# Patient Record
Sex: Female | Born: 1960
Health system: Southern US, Community
[De-identification: ages and names within clinical notes are randomized; demographics above are authoritative.]

## PROBLEM LIST (undated history)

## (undated) DIAGNOSIS — M545 Low back pain, unspecified: Secondary | ICD-10-CM

## (undated) DIAGNOSIS — M722 Plantar fascial fibromatosis: Secondary | ICD-10-CM

## (undated) DIAGNOSIS — M5136 Other intervertebral disc degeneration, lumbar region: Secondary | ICD-10-CM

## (undated) DIAGNOSIS — M51369 Other intervertebral disc degeneration, lumbar region without mention of lumbar back pain or lower extremity pain: Secondary | ICD-10-CM

## (undated) DIAGNOSIS — E559 Vitamin D deficiency, unspecified: Secondary | ICD-10-CM

## (undated) DIAGNOSIS — M542 Cervicalgia: Secondary | ICD-10-CM

## (undated) DIAGNOSIS — E785 Hyperlipidemia, unspecified: Secondary | ICD-10-CM

## (undated) DIAGNOSIS — I10 Essential (primary) hypertension: Secondary | ICD-10-CM

## (undated) DIAGNOSIS — M5030 Other cervical disc degeneration, unspecified cervical region: Secondary | ICD-10-CM

## (undated) DIAGNOSIS — K589 Irritable bowel syndrome without diarrhea: Secondary | ICD-10-CM

## (undated) DIAGNOSIS — K602 Anal fissure, unspecified: Secondary | ICD-10-CM

## (undated) HISTORY — PX: OTHER SURGICAL HISTORY: SHX169

## (undated) HISTORY — DX: Essential (primary) hypertension: I10

## (undated) HISTORY — DX: Cervicalgia: M54.2

## (undated) HISTORY — DX: Low back pain: M54.5

## (undated) HISTORY — DX: Irritable bowel syndrome, unspecified: K58.9

## (undated) HISTORY — DX: Low back pain, unspecified: M54.50

## (undated) HISTORY — DX: Other intervertebral disc degeneration, lumbar region: M51.36

## (undated) HISTORY — DX: Other intervertebral disc degeneration, lumbar region without mention of lumbar back pain or lower extremity pain: M51.369

## (undated) HISTORY — DX: Vitamin D deficiency, unspecified: E55.9

## (undated) HISTORY — DX: Other cervical disc degeneration, unspecified cervical region: M50.30

## (undated) HISTORY — DX: Hyperlipidemia, unspecified: E78.5

## (undated) HISTORY — DX: Plantar fascial fibromatosis: M72.2

## (undated) HISTORY — DX: Anal fissure, unspecified: K60.2

## (undated) HISTORY — PX: PLANTAR FASCIA SURGERY: SHX746

## (undated) HISTORY — PX: NECK SURGERY: SHX720

---

## 1996-01-16 HISTORY — PX: NECK SURGERY: SHX720

## 1998-01-28 ENCOUNTER — Other Ambulatory Visit: Admission: RE | Admit: 1998-01-28 | Discharge: 1998-01-28 | Payer: Self-pay | Admitting: *Deleted

## 1999-03-08 ENCOUNTER — Ambulatory Visit (HOSPITAL_COMMUNITY): Admission: RE | Admit: 1999-03-08 | Discharge: 1999-03-08 | Payer: Self-pay | Admitting: Internal Medicine

## 1999-03-08 ENCOUNTER — Encounter: Payer: Self-pay | Admitting: Internal Medicine

## 2000-12-27 ENCOUNTER — Encounter: Payer: Self-pay | Admitting: Internal Medicine

## 2000-12-27 ENCOUNTER — Ambulatory Visit (HOSPITAL_COMMUNITY): Admission: RE | Admit: 2000-12-27 | Discharge: 2000-12-27 | Payer: Self-pay | Admitting: Internal Medicine

## 2001-01-06 ENCOUNTER — Ambulatory Visit (HOSPITAL_COMMUNITY): Admission: RE | Admit: 2001-01-06 | Discharge: 2001-01-06 | Payer: Self-pay | Admitting: Internal Medicine

## 2001-01-06 ENCOUNTER — Encounter: Payer: Self-pay | Admitting: Internal Medicine

## 2001-04-11 ENCOUNTER — Encounter: Payer: Self-pay | Admitting: Internal Medicine

## 2001-04-11 ENCOUNTER — Ambulatory Visit (HOSPITAL_COMMUNITY): Admission: RE | Admit: 2001-04-11 | Discharge: 2001-04-11 | Payer: Self-pay | Admitting: Internal Medicine

## 2001-08-05 ENCOUNTER — Ambulatory Visit (HOSPITAL_COMMUNITY): Admission: RE | Admit: 2001-08-05 | Discharge: 2001-08-05 | Payer: Self-pay | Admitting: Internal Medicine

## 2001-08-05 ENCOUNTER — Encounter: Payer: Self-pay | Admitting: Internal Medicine

## 2002-12-21 ENCOUNTER — Observation Stay (HOSPITAL_COMMUNITY): Admission: EM | Admit: 2002-12-21 | Discharge: 2002-12-22 | Payer: Self-pay | Admitting: Emergency Medicine

## 2004-05-29 ENCOUNTER — Ambulatory Visit: Payer: Self-pay | Admitting: Gastroenterology

## 2004-06-01 ENCOUNTER — Ambulatory Visit: Payer: Self-pay | Admitting: Gastroenterology

## 2004-06-28 ENCOUNTER — Ambulatory Visit: Payer: Self-pay | Admitting: Gastroenterology

## 2006-05-23 ENCOUNTER — Ambulatory Visit (HOSPITAL_COMMUNITY): Admission: RE | Admit: 2006-05-23 | Discharge: 2006-05-23 | Payer: Self-pay | Admitting: Neurosurgery

## 2008-01-13 ENCOUNTER — Ambulatory Visit (HOSPITAL_COMMUNITY): Admission: RE | Admit: 2008-01-13 | Discharge: 2008-01-14 | Payer: Self-pay | Admitting: Neurosurgery

## 2008-01-13 IMAGING — CR DG LUMBAR SPINE 2-3V
1 series · 1 of 1 positions shown · non-contrast
Comparison: [DATE].

CLINICAL DATA: Recurrent HNP.  L5-S1 microdiskectomy.

LUMBAR SPINE - 2-3 VIEW

[view not recorded]
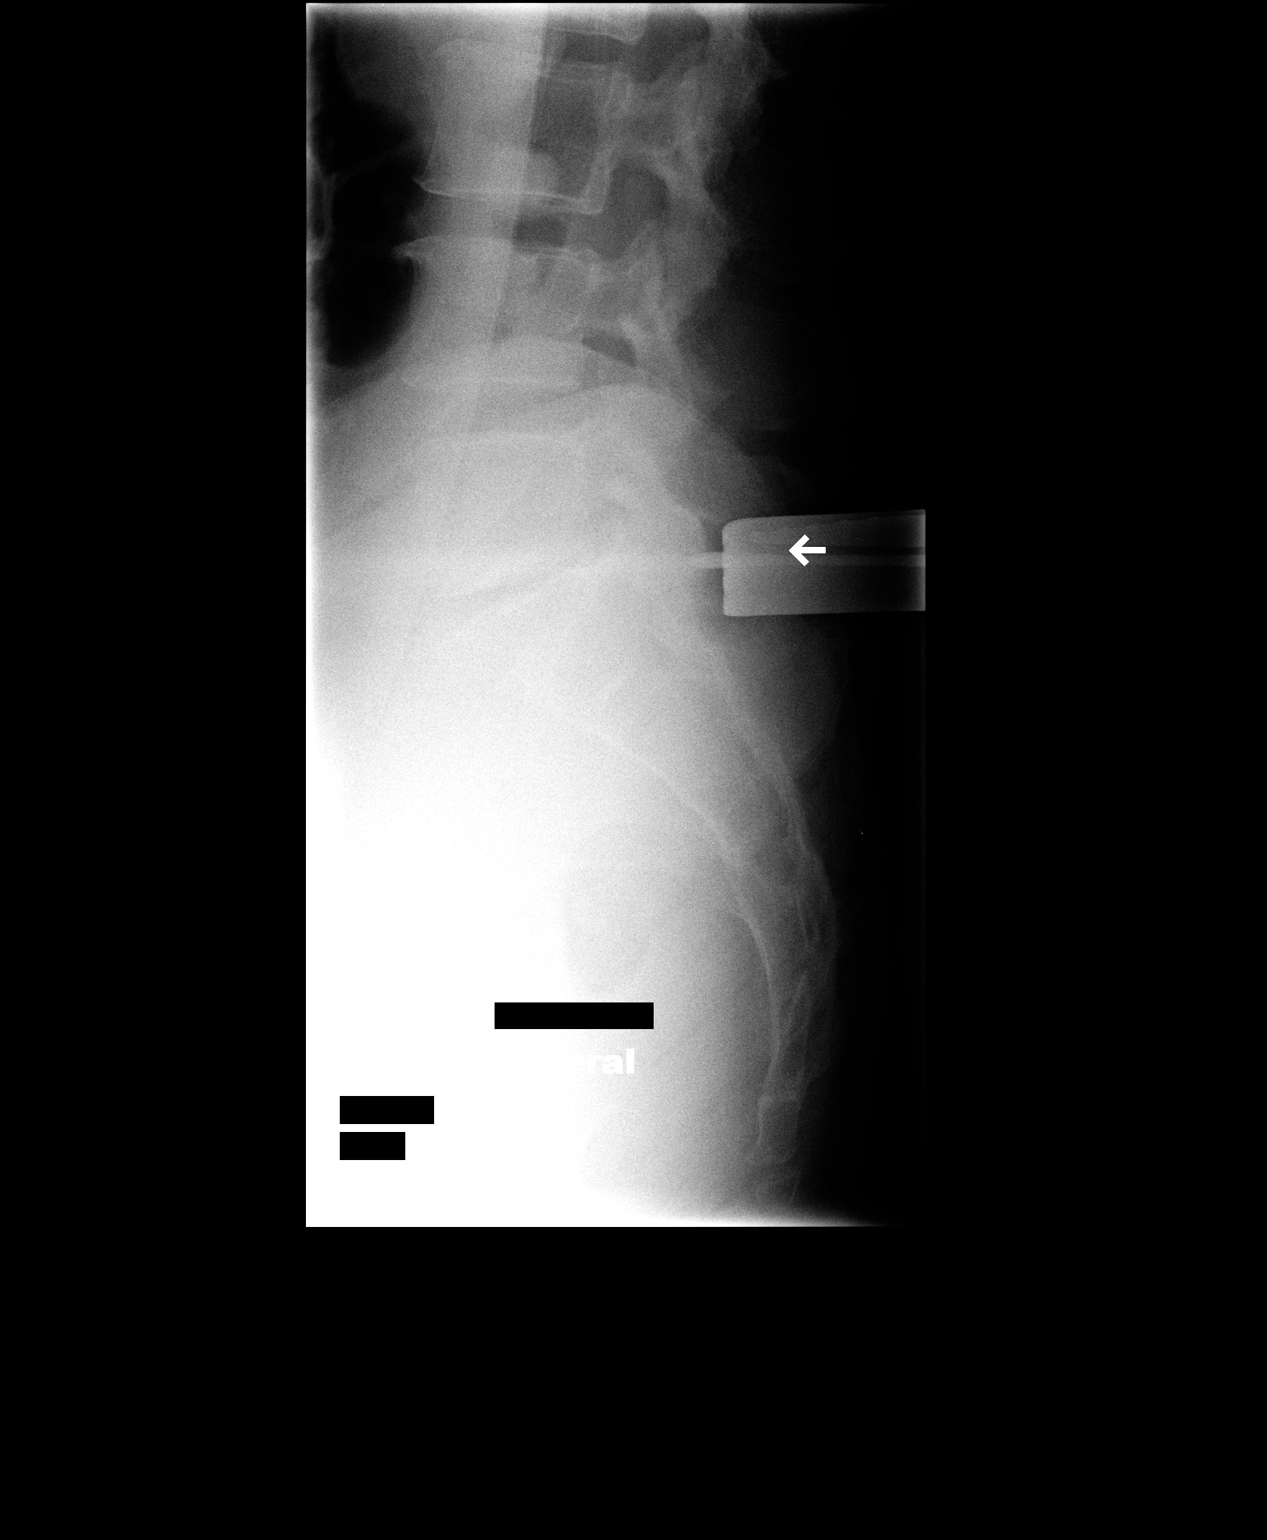

[1 of 1 positions shown; findings below may reference images not displayed]

FINDINGS: 2 lateral lumbar spine localization films are submitted
for interpretation.  The initial image demonstrates a localization
needle posterior to the S1 level.  Subsequent exam demonstrate
retractors over the posterior soft tissues of L5-S1, and a probe in
the L5-S1 interspace.
IMPRESSION: Lateral intraoperative films demonstrated localization over
posterior L5-S1 interspace.

## 2009-04-05 ENCOUNTER — Ambulatory Visit (HOSPITAL_COMMUNITY): Admission: RE | Admit: 2009-04-05 | Discharge: 2009-04-05 | Payer: Self-pay | Admitting: Internal Medicine

## 2010-05-18 ENCOUNTER — Other Ambulatory Visit (HOSPITAL_COMMUNITY): Payer: Self-pay | Admitting: Internal Medicine

## 2010-05-18 ENCOUNTER — Ambulatory Visit (HOSPITAL_COMMUNITY)
Admission: RE | Admit: 2010-05-18 | Discharge: 2010-05-18 | Disposition: A | Discharge status: Home / Self Care | Payer: BC Managed Care – PPO | Source: Ambulatory Visit | Attending: Internal Medicine | Admitting: Internal Medicine

## 2010-05-18 DIAGNOSIS — R059 Cough, unspecified: Secondary | ICD-10-CM | POA: Insufficient documentation

## 2010-05-18 DIAGNOSIS — R911 Solitary pulmonary nodule: Secondary | ICD-10-CM | POA: Insufficient documentation

## 2010-05-18 DIAGNOSIS — F17200 Nicotine dependence, unspecified, uncomplicated: Secondary | ICD-10-CM | POA: Insufficient documentation

## 2010-05-18 DIAGNOSIS — R05 Cough: Secondary | ICD-10-CM | POA: Insufficient documentation

## 2010-05-18 DIAGNOSIS — R918 Other nonspecific abnormal finding of lung field: Secondary | ICD-10-CM

## 2010-05-18 DIAGNOSIS — M25519 Pain in unspecified shoulder: Secondary | ICD-10-CM

## 2010-05-18 DIAGNOSIS — M79609 Pain in unspecified limb: Secondary | ICD-10-CM | POA: Insufficient documentation

## 2010-05-30 ENCOUNTER — Other Ambulatory Visit: Payer: Self-pay | Admitting: Neurosurgery

## 2010-05-30 DIAGNOSIS — M542 Cervicalgia: Secondary | ICD-10-CM

## 2010-05-30 NOTE — Op Note (Signed)
NAMELORELEY, SCHWALL            ACCOUNT NO.:  [id number]   MEDICAL RECORD NO.:  [id number]          PATIENT TYPE:  OIB   LOCATION:  3535                         FACILITY:  [facility]   PHYSICIAN:  Reinaldo Meeker, M.D. DATE OF BIRTH:  06/12/1960   DATE OF PROCEDURE:  01/13/2008  DATE OF DISCHARGE:                               OPERATIVE REPORT   PREOPERATIVE DIAGNOSIS:  Herniated disk L5-S1 left, recurrent.   POSTOPERATIVE DIAGNOSIS:  Herniated disk L5-S1 left, recurrent.   PROCEDURE:  Left L5-S1 redo microdiskectomy.   SURGEON:  Reinaldo Meeker, MD   ASSISTANT:  Tia Alert, MD   PROCEDURE IN DETAIL:  After placing in the prone position, the patient's  back was prepped and draped in the usual sterile fashion.  Localizing x-  rays were taken prior to incision to identify the appropriate level.  Midline incision was made above the spinous processes of L5 and S1.  Using Bovie cutting current, the incision was carried out on the spinous  processes.  Subperiosteal dissection was then carried out bilaterally on  the spinous processes and lamina.  Self-retaining retractor was placed  for exposure.  X-ray showed approach to the appropriate level.  Edges of  the previous laminotomy were identified, dissected free of soft tissue  with curette and laminotomy was enlarged.  At this time, the microscope  was draped, brought into the field and used for the remainder of the  case.  Using microsection technique, the lateral aspect of the S1 nerve  root was found, going on the pedicle of S1 that was dissected medially.  The disk space was then incised and cleaned out with pituitary rongeurs  and curettes.  When the disk had been thoroughly cleaned out, inspection  was carried out under the nerve root where one extremely large fragment  disk material was identified and this was removed, give excellent  relaxation of the nerve root.  At this point, inspection was carried out  once more for any  evidence of residual compression and none could be  identified.  Large amounts of irrigation were carried out and any  bleeding controlled with bipolar coagulation and Gelfoam.  The wound was  then closed in multiple layers of Vicryl in the muscle fascia,  subcutaneous and subcuticular tissues and staples were placed on the  skin.  A sterile dressing was then applied.  The patient was extubated  and taken to recovery room in stable condition.           ______________________________  Reinaldo Meeker, M.D.     ROK/MEDQ  D:  01/13/2008  T:  01/14/2008  Job:  [id number]

## 2010-06-02 ENCOUNTER — Ambulatory Visit
Admission: RE | Admit: 2010-06-02 | Discharge: 2010-06-02 | Disposition: A | Discharge status: Home / Self Care | Payer: BC Managed Care – PPO | Source: Ambulatory Visit | Attending: Neurosurgery | Admitting: Neurosurgery

## 2010-06-02 DIAGNOSIS — M542 Cervicalgia: Secondary | ICD-10-CM

## 2010-06-02 NOTE — Discharge Summary (Signed)
[name], [name]                      ACCOUNT NO.:  [id number]   MEDICAL RECORD NO.:  [id number]                   PATIENT TYPE:  OBV   LOCATION:  5524                                 FACILITY:  [facility]   PHYSICIAN:  Hettie Holstein, D.O.                 DATE OF BIRTH:  [birth year]   DATE OF ADMISSION:  12/20/2002  DATE OF DISCHARGE:  12/22/2002                                 DISCHARGE SUMMARY   ADMISSION DIAGNOSIS:  Abdominal pain.   DISCHARGE DIAGNOSIS:  1. Obstipation and heme positive stool felt to be secondary to above with     hemoglobin and hematocrit stable.  2. Leukocytosis.  3. Irritable bowel syndrome suspected.   HISTORY OF PRESENT ILLNESS:  This patient presented to Dr. Tammi Klippel on  December 20, 2002.  The patient is a very pleasant 50 year old female with no  significant past medical history, reports approximately two hour history of  abdominal pain which began when she tried to have a bowel movement at 10  o'clock on the morning of December 20, 2002.  The patient was in her usual  state of health until this past weekend when she woke up the morning of  December 5 and at approximately 10 a.m. went to go have a bowel movement  which was accompanied by severe pain and abdominal cramping and the patient  was unable to have a bowel movement.  Throughout the course of the day, the  symptoms did not improve, so she came to the ER for further evaluation.  The  patient denies any recent travel.  She denies anorectal intercourse or any  trauma.  She denies any other associated complaints.   HOSPITAL COURSE:  The patient was admitted and started on GoLYTELY and she  responded quite well with a large bowel movement while on the commode with  prompt relief of her symptoms.  No blood was evident and she had no  recurrence of sensation of tenesmus.  Her hemoglobin and hematocrit remained  stable.  CT was performed in the emergency department that did reveal mild  edematous or  inflammatory changes around the rectum.  Her leukocytosis  resolved and at the time of discharge, her laboratory data was unremarkable  and were as follows:  Sodium 138, potassium 3.9, BUN 4, creatinine 0.6,  glucose 103, WBC down to 6.2, hemoglobin 12, hematocrit 36, MCV 83.  At this  time, the patient is being discharged with follow up on January 20, 2003, at  the earliest follow up with gastroenterologist, Dr. Victorino Dike.                                                Hettie Holstein, D.O.    ESS/MEDQ  D:  12/22/2002  T:  12/22/2002  Job:  [id number]   cc:   Ulyess Mort, M.D. [facility]

## 2010-06-02 NOTE — H&P (Signed)
Monica [name], Monica [name]                      ACCOUNT NO.:  [id number]   MEDICAL RECORD NO.:  [id number]                   PATIENT TYPE:  OBV   LOCATION:  1823                                 FACILITY:  [facility]   PHYSICIAN:  Ara D. Metjian, M.D.                DATE OF BIRTH:  26-Nov-1960   DATE OF ADMISSION:  12/20/2002  DATE OF DISCHARGE:                                HISTORY & PHYSICAL   CHIEF COMPLAINT:  Abdominal pain and tenesmus.   HISTORY OF PRESENT ILLNESS:  The patient is a very pleasant 50 year old  white female with no significant past medical history, who reports an  approximate 12-hour history of abdominal pain that began when she tried to  have a bowel movement at 10 o'clock on the morning of December 20, 2002.  The  patient was in her usual state of health during the past week and weekend,  woke up on the morning of December 20, 2002, in her usual state of health and  approximately at 10 a.m. went to go have a bowel movement which was  accompanied by severe pain and abdominal cramping and the patient was unable  to have a bowel movement.  The pain and the cramping persisted as long as  she tried to have a bowel movement and slowly eased off throughout the  course of the day.  Her symptoms did not improve, so she came to the  emergency room for further evaluation.  The patient denies any recent travel  or exposure to lakes, ponds, or streams.  Denies any anoreceptive  intercourse or any trauma, however, the patient does admit to eating a fair  amount of restaurant food at work in the past week and the patient also has  well water.  Otherwise, currently the patient is without complaints now and  is feeling better than when she first came in.   ALLERGIES:  No known drug allergies.   MEDICATIONS:  1. Hyoscyamine 0.125 sublingual p.r.n.  2. Loperamide p.r.n.  3. Naprosyn p.r.n.   PAST MEDICAL HISTORY:  Question of irritable bowel syndrome.   PAST SURGICAL HISTORY:   Status post neck surgery in 1998.  Status post  cesarean section in 1989.   SOCIAL HISTORY:  The patient admits to approximately 30-pack-year smoking  history.  She denies alcohol, illicit, or IV drug use.  She lives in  [place] with her husband and her son. Her parents live next door.  She is  a Psychiatric nurse at [employer].  They have well water and they have  three dogs at home.   FAMILY HISTORY:  Mother alive at age 40, healthy.  Father alive at age 18  with diabetes.  Sister alive at age 31 with history of cervical cancer.  The  patient denied any knowledge of any family members with any hematologic,  gastrointestinal, genetic, or endocrine abnormalities.   REVIEW OF SYSTEMS:  The patient admits  to chronic cough which she associates  with her smoking, chills, and frequent cold sores on her lips.  The patient  denies headache, alopecia, visual acuity changes, scintillations, scotomas,  auditory changes, epistaxis, oral ulcers or lesions, dysphagia, odynophagia,  shortness of breath at rest or upon exertion, PND, orthopnea, hemoptysis,  exposure to people with tuberculosis, chest pain at rest or upon exertion,  nausea and vomiting, bright red blood per rectum, melena, hematemesis,  dysuria, polyuria, hematuria, myalgias, arthralgias, fevers, sweats, weight  loss or weight gain, skin rashes.   PHYSICAL EXAMINATION:  VITAL SIGNS:  TMAX 98.7, pulse 68, respirations 16,  blood pressure 99/54, pulse oximetry 97% on room air.  GENERAL:  A well-developed, well-nourished, 49 year old white female  speaking in full sentences in no acute distress.  HEENT:  Head normocephalic and atraumatic without alopecia.  Eyes; pupils  equal, round, and reactive to light.  Extraocular muscles are intact.  Anicteric.  No injection and no discharge.  Normal appearing conjunctiva.  Ears; TM's clear bilaterally.  Nose; no dry blood in the nares.  Mouth;  moist mucous membranes.  Uvula midline.   Oropharynx without erythema or  exudate.  Dentition in good repair.  No ulcers or lesions appreciated.  NECK:  Supple without lymphadenopathy, thyromegaly, bruit, or JVD.  No  meningeal signs appreciated.  LUNGS:  Clear to auscultation bilaterally without rales, rhonchi, or  wheezes.  HEART:  Regular rate and rhythm with accentuated S1, S2, no murmurs, rubs,  or gallops.  ABDOMEN:  Nondistended, bowel sounds present.  Mild tenderness to palpation  in the right lower quadrant without guarding or rebound. No  hepatosplenomegaly, no pulsatile masses.  No abdominal bruits.  RECTAL:  Deferred secondary to rectal pain.  EXTREMITIES:  No cyanosis, clubbing, or edema.  NEUROLOGY:  No focal or gross motor or sensory deficits appreciated.   LABORATORY DATA:  White blood cell count 15.1, H&H 18/39.4 with platelet  count of 192.  Absolute neutrophil count 12.7, beta hCG negative. Urinalysis  negative. Sodium 139, potassium 3.9, chloride 108, carbon dioxide 23, BUN 9,  creatinine 0.5, glucose 96.  Fecal occult blood test is positive.   Abdominal x-ray consistent with a marked amount of stool within the abdomen.  CT of the abdomen consistent with rectal wall edema and stool impaction.   ASSESSMENT:  A 49 year old white female with proctitis and heme positive  stools.   Problem 1.  Neuropsyche.  No signs or symptoms of meningitis, encephalitis,  or CVA.  The patient appears euthymic.  I have discussed with the patient  the need for smoking cessation.  Will try the Nicotine patches while she is  in house.  Otherwise currently no active issues.   Problem 2.  Pulmonary.  No active issues.   Problem 3.  Cardiovascular.  No active issues.   Problem 4.  Renal.  No active issues.   Problem 5.  GI.  Unclear cause of the patient's proctitis.  Maybe secondary  to fecal impaction caused by overuse of Loperamide and Hyoscyamine. However, this is not consistent with the patient's reported history of  judicious use  of those medications.  Alternative diagnoses include infectious proctitis  with tenesmus caused by frequent restaurant use. This may be worrisome for  Shigella as a presenting cause with proctitis and tenesmus.  At this time,  we will consult the patient's outpatient gastroenterologist, Ulyess Mort, M.D. [facility], and will prep the patient for possible endoscopy.   Problem 6.  Fluids,  electrolytes, and nutrition.  Will give the patient D5  1/2 normal saline with 20 mEq potassium at 100 mL an hour and will keep the  patient on clear liquids only.   Problem 7.  ID.  Will check a stool culture with O&P to evaluate for  possible infectious etiology of her proctitis.   Problem 8.  Heme/oncology.  No active issues.   Problem 9.  Endocrine.  No active issues.   Problem 10.  Prophylaxis.  Start the patient on full p.o.  Assess  possibility for GI prophylaxis, will ambulate the patient for DVT  prophylaxis.   DISPOSITION:  The patient is a full code.                                                Ara D. Tammi Klippel, M.D.    ADM/MEDQ  D:  12/21/2002  T:  12/21/2002  Job:  [id number]   cc:   Lucky Cowboy, M.D.  [address]  [place], [place] [address]  Fax: [phone]

## 2010-06-02 NOTE — Op Note (Signed)
Monica [name], Monica [name]            ACCOUNT NO.:  [id number]   MEDICAL RECORD NO.:  [id number]          PATIENT TYPE:  AMB   LOCATION:  SDS                          FACILITY:  [facility]   PHYSICIAN:  Reinaldo Meeker, M.D. DATE OF BIRTH:  1960/05/25   DATE OF PROCEDURE:  05/23/2006  DATE OF DISCHARGE:                               OPERATIVE REPORT   PREOPERATIVE DIAGNOSIS:  Herniated disk L5-S1 left.   POSTOPERATIVE DIAGNOSIS:  Herniated disk L5-S1 left.   PROCEDURE:  Left L5-S1 interlaminar laminotomy for excision of herniated  disk with operative microscope.   SECONDARY PROCEDURE:  Microdissection L5-S1 disk and S1 nerve root.   SURGEON:  Reinaldo Meeker, M.D.   ASSISTANT:  Tia Alert, MD   PROCEDURE IN DETAIL:  After placed in the prone position the patient's  back was prepped and draped in the usual sterile fashion.  Localizing x-  rays taken prior to incision identified the appropriate level.  Midline  incision was made above the spinous process of L5 and S1.  Using Bovie  cutting current incision was carried down the spinous processes.  Subperiosteal dissection was then carried out on the left-sided spinous  processes and lamina and self retaining retractor was placed for  exposure.  X-rays showed approach to the appropriate level.  Using a  high-speed drill, inferior one third of the L5 lamina, medial one third  of facet joint removed.  Drill was then used to move the superior third  of the S1 lamina.  Residual bone ligamentum flavum removed in a  piecemeal fashion.  Microscope was draped brought in the field and used  for remainder of case.  Using microsection technique the lateral aspect  of thecal sac and S1 nerve root were identified.  Further coagulation  was carried out down floor the canal to identify the L5-S1 disk which  was found to be tremendously herniated beneath the nerve root.  After  coagulating on the annulus, the annulus was incised with 15 blade.  Using pituitary rongeurs and curettes thorough disk space clean-out was  carried out.  At same time great care was taken to avoid injury to the  neural elements.  This was successfully done.  At this point inspection  was carried out all directions for any evidence of residual compression  and none could be identified.  Large amounts of irrigation were carried  out.  Any bleeding controlled bipolar coagulation and Gelfoam.  The  wound was then closed with interrupted Vicryl in the muscle, fascia,  subcutaneous subcuticular tissues and staples were placed on the skin.  A sterile dressing was then applied.  The patient was extubated, taken  to recovery in stable condition.           ______________________________  Reinaldo Meeker, M.D.     ROK/MEDQ  D:  05/23/2006  T:  05/23/2006  Job:  [id number]

## 2010-10-20 LAB — CBC
HCT: 42.2 % (ref 36.0–46.0)
Hemoglobin: 13.7 g/dL (ref 12.0–15.0)
MCHC: 32.4 g/dL (ref 30.0–36.0)
MCV: 81.5 fL (ref 78.0–100.0)
RBC: 5.18 MIL/uL — ABNORMAL HIGH (ref 3.87–5.11)

## 2011-03-26 ENCOUNTER — Encounter: Payer: Self-pay | Admitting: Gastroenterology

## 2011-05-15 ENCOUNTER — Other Ambulatory Visit (HOSPITAL_COMMUNITY): Payer: Self-pay | Admitting: Internal Medicine

## 2011-05-15 ENCOUNTER — Ambulatory Visit (HOSPITAL_COMMUNITY)
Admission: RE | Admit: 2011-05-15 | Discharge: 2011-05-15 | Disposition: A | Discharge status: Home / Self Care | Payer: BC Managed Care – PPO | Source: Ambulatory Visit | Attending: Internal Medicine | Admitting: Internal Medicine

## 2011-05-15 DIAGNOSIS — M25519 Pain in unspecified shoulder: Secondary | ICD-10-CM

## 2011-05-15 DIAGNOSIS — M542 Cervicalgia: Secondary | ICD-10-CM

## 2012-04-16 ENCOUNTER — Ambulatory Visit (HOSPITAL_COMMUNITY)
Admission: RE | Admit: 2012-04-16 | Discharge: 2012-04-16 | Disposition: A | Discharge status: Home / Self Care | Payer: BC Managed Care – PPO | Source: Ambulatory Visit | Attending: Internal Medicine | Admitting: Internal Medicine

## 2012-04-16 ENCOUNTER — Other Ambulatory Visit (HOSPITAL_COMMUNITY): Payer: Self-pay | Admitting: Internal Medicine

## 2012-04-16 DIAGNOSIS — M545 Low back pain, unspecified: Secondary | ICD-10-CM | POA: Insufficient documentation

## 2012-04-16 DIAGNOSIS — M25559 Pain in unspecified hip: Secondary | ICD-10-CM | POA: Insufficient documentation

## 2012-04-16 DIAGNOSIS — M5137 Other intervertebral disc degeneration, lumbosacral region: Secondary | ICD-10-CM | POA: Insufficient documentation

## 2012-04-16 DIAGNOSIS — M25552 Pain in left hip: Secondary | ICD-10-CM

## 2012-04-16 DIAGNOSIS — M51379 Other intervertebral disc degeneration, lumbosacral region without mention of lumbar back pain or lower extremity pain: Secondary | ICD-10-CM | POA: Insufficient documentation

## 2012-04-16 DIAGNOSIS — R209 Unspecified disturbances of skin sensation: Secondary | ICD-10-CM | POA: Insufficient documentation

## 2012-04-23 ENCOUNTER — Encounter: Payer: Self-pay | Admitting: Gastroenterology

## 2012-11-19 ENCOUNTER — Telehealth: Payer: Self-pay | Admitting: Emergency Medicine

## 2012-11-19 ENCOUNTER — Telehealth: Payer: Self-pay | Admitting: *Deleted

## 2012-11-19 DIAGNOSIS — F411 Generalized anxiety disorder: Secondary | ICD-10-CM

## 2012-11-19 MED ORDER — ALPRAZOLAM 0.5 MG PO TABS: 0.5000 mg | ORAL_TABLET | Freq: Three times a day (TID) | ORAL | Status: AC | PRN

## 2012-11-19 NOTE — Telephone Encounter (Signed)
Spoke with pt in reference to Dermatology referral per Loree Fee, PA-C orders from 11/10/12 ov.  Per pt she requested [facility] since in network with insurance.  [facility] next avail appt not until April 2015.  Pt requests earlier appt with derm and therefore will have Lillia Abed fax referral to [facility].

## 2012-11-19 NOTE — Telephone Encounter (Signed)
Pt need to get a mail order rx on her xanax for 90 days supply thur the mail order=[facility]= she wanted me to let Efraim Kaufmann know that she did get the rx of the xanac at the pharm=so she good for a few wks. She said that she has givin [place] the form to use for the mail order rx. No need to call her back. sendin chart back.

## 2012-11-28 ENCOUNTER — Encounter: Payer: Self-pay | Admitting: Interventional Cardiology

## 2012-11-28 ENCOUNTER — Ambulatory Visit (INDEPENDENT_AMBULATORY_CARE_PROVIDER_SITE_OTHER): Payer: BC Managed Care – PPO | Admitting: Interventional Cardiology

## 2012-11-28 VITALS — BP 132/65 | HR 81 | Ht 62.0 in | Wt 182.2 lb

## 2012-11-28 DIAGNOSIS — F17200 Nicotine dependence, unspecified, uncomplicated: Secondary | ICD-10-CM

## 2012-11-28 DIAGNOSIS — Z8249 Family history of ischemic heart disease and other diseases of the circulatory system: Secondary | ICD-10-CM | POA: Insufficient documentation

## 2012-11-28 DIAGNOSIS — R079 Chest pain, unspecified: Secondary | ICD-10-CM

## 2012-11-28 DIAGNOSIS — Z87891 Personal history of nicotine dependence: Secondary | ICD-10-CM | POA: Insufficient documentation

## 2012-11-28 NOTE — Patient Instructions (Signed)
Your physician has requested that you have an exercise tolerance test. For further information please visit [email or web address] Please also follow instruction sheet, as given.  Your physician recommends that you schedule a follow-up appointment in: 4 weeks with Dr. Eldridge Dace.

## 2012-11-28 NOTE — Progress Notes (Signed)
Patient ID: Monica [name], female   DOB: 1960-09-18, 52 y.o.   MRN: [id number]     Patient ID: Monica [name] MRN: [id number] DOB/AGE: 01-30-1960 52 y.o.   Referring Physician Dr. Oneta Rack   Reason for Consultation chest discomfort  HPI: 52 y/o who has no cardiac history.  She began having episodes of chest pain.  Associated sx include a hot flash, nausea.  No particular triggers.  She was siting still for some ewpisodes or doing some light exertion.  No change with emotional stress.  Started Chantix in September.  She has been taking this intermittently.  She has avoid cigarettes since 9/15.    She does not have any chest pain with housework which is her most strenuous activity.  THe last 2 days, she has walked for 30 minutes and has not had any CP.  If she tries to jog, she'll have some shortness of breath. She then has back to walking and feels better.   Current Outpatient Prescriptions  Medication Sig Dispense Refill  . acyclovir (ZOVIRAX) 800 MG tablet Take 800 mg by mouth as needed.      . ALPRAZolam (XANAX) 0.5 MG tablet Take 1 tablet (0.5 mg total) by mouth 3 (three) times daily as needed for sleep or anxiety.  90 tablet  0  . Cholecalciferol (VITAMIN D-3 PO) Take by mouth.      . Cyanocobalamin (VITAMIN B-12 PO) Take by mouth.      . fish oil-omega-3 fatty acids 1000 MG capsule Take 1 g by mouth daily.      . Flaxseed, Linseed, (FLAXSEED OIL) 1200 MG CAPS Take by mouth daily.      . meclizine (ANTIVERT) 25 MG tablet Take 25 mg by mouth 3 (three) times daily as needed for dizziness.      . meloxicam (MOBIC) 15 MG tablet Take 15 mg by mouth daily.      . montelukast (SINGULAIR) 10 MG tablet Take 10 mg by mouth at bedtime.      . Varenicline Tartrate (CHANTIX PO) Take by mouth daily.       No current facility-administered medications for this visit.   Past Medical History  Diagnosis Date  . Lower back pain   . Neck pain   . Plantar fasciitis of left foot   .  Plantar fasciitis of right foot     Family History  Problem Relation Age of Onset  . Heart disease Father   . Cervical cancer Sister     History   Social History  . Marital Status: Married    Spouse Name: N/A    Number of Children: N/A  . Years of Education: N/A   Occupational History  . Not on file.   Social History Main Topics  . Smoking status: Former Games developer  . Smokeless tobacco: Not on file  . Alcohol Use: No  . Drug Use: No  . Sexual Activity: Not on file   Other Topics Concern  . Not on file   Social History Narrative  . No narrative on file    Past Surgical History  Procedure Laterality Date  . Lower back      L4 and L5   . Neck surgery      metal plate and pins in neck  . Plantar fascia surgery      both feet  . Cesarean section        (Not in a hospital admission)  Review of systems complete and found to  be negative unless listed above .  No nausea, vomiting.  No fever chills, No focal weakness,  No palpitations.  Physical Exam: Filed Vitals:   11/28/12 1409  BP: 132/65  Pulse: 81    Weight: 182 lb 3.2 oz (82.645 kg)  Physical exam:  [facility]/AT EOMI No JVD, No carotid bruit RRR S1S2  No wheezing Soft. NT, nondistended No edema. 2+ PT pulses bilaterally No focal motor or sensory deficits Normal affect  Labs:   Lab Results  Component Value Date   WBC 6.8 01/06/2008   HGB 13.7 01/06/2008   HCT 42.2 01/06/2008   MCV 81.5 01/06/2008   PLT 252 01/06/2008   No results found for this basename: NA, K, CL, CO2, BUN, CREATININE, CALCIUM, LABALBU, PROT, BILITOT, ALKPHOS, ALT, AST, GLUCOSE,  in the last 168 hours No results found for this basename: CKTOTAL, CKMB, CKMBINDEX, TROPONINI    No results found for this basename: CHOL   No results found for this basename: HDL   No results found for this basename: LDLCALC   No results found for this basename: TRIG   No results found for this basename: CHOLHDL   No results found for this basename:  LDLDIRECT       EKG:NSR  ASSESSMENT AND PLAN:  1. Chest pain: Several atypical features.  She has a long smoking history.  Will evaluate for ischemia with an ETT.   2.  Family h/o CAD: Father with CABG at age 53.  He has also had PAD.   3.  Tobacco abuse: SHe has quit for two months.  I encouraged her to continue to abstain.  She has gained some weight.  Signed:   Fredric Mare, MD, [facility] 11/28/2012, 2:54 PM

## 2012-12-25 ENCOUNTER — Encounter: Payer: Self-pay | Admitting: Interventional Cardiology

## 2012-12-26 ENCOUNTER — Ambulatory Visit (INDEPENDENT_AMBULATORY_CARE_PROVIDER_SITE_OTHER): Payer: BC Managed Care – PPO | Admitting: Interventional Cardiology

## 2012-12-26 DIAGNOSIS — R079 Chest pain, unspecified: Secondary | ICD-10-CM

## 2012-12-26 NOTE — Progress Notes (Signed)
Exercise Treadmill Test  Pre-Exercise Testing Evaluation Rhythm: normal sinus  Rate: 68 bpm     Test  Exercise Tolerance Test Ordering MD: Lance Muss, MD  Interpreting MD: Lance Muss, MD  Unique Test No: 1  Treadmill:  1  Indication for ETT: chest pain - rule out ischemia  Contraindication to ETT: Yes   Stress Modality: exercise - treadmill  Cardiac Imaging Performed: non   Protocol: standard Bruce - maximal  Max BP:  172/88  Max MPHR (bpm):  167 85% MPR (bpm):  142  MPHR obtained (bpm):  155 % MPHR obtained:  92  Reached 85% MPHR (min:sec):  8:50 Total Exercise Time (min-sec):  9:14  Workload in METS:  10.4 Borg Scale: 16  Reason ETT Terminated:  target heart rate achieved    ST Segment Analysis At Rest: normal ST segments - no evidence of significant ST depression With Exercise: no evidence of significant ST depression  Other Information Arrhythmia:  No Angina during ETT:  absent (0) Quality of ETT:  diagnostic  ETT Interpretation:  normal - no evidence of ischemia by ST analysis  Comments: Average exercise tolerance  Recommendations: Try to increase exercise level to improve stamina.

## 2013-01-06 ENCOUNTER — Encounter: Payer: Self-pay | Admitting: Internal Medicine

## 2013-01-12 ENCOUNTER — Encounter: Payer: Self-pay | Admitting: Emergency Medicine

## 2013-01-12 ENCOUNTER — Ambulatory Visit (INDEPENDENT_AMBULATORY_CARE_PROVIDER_SITE_OTHER): Payer: BC Managed Care – PPO | Admitting: Emergency Medicine

## 2013-01-12 VITALS — BP 128/82 | HR 72 | Temp 98.6°F | Resp 18 | Ht 64.0 in | Wt 184.0 lb

## 2013-01-12 DIAGNOSIS — E785 Hyperlipidemia, unspecified: Secondary | ICD-10-CM

## 2013-01-12 DIAGNOSIS — D239 Other benign neoplasm of skin, unspecified: Secondary | ICD-10-CM

## 2013-01-12 DIAGNOSIS — I10 Essential (primary) hypertension: Secondary | ICD-10-CM

## 2013-01-12 DIAGNOSIS — E663 Overweight: Secondary | ICD-10-CM

## 2013-01-12 DIAGNOSIS — M5030 Other cervical disc degeneration, unspecified cervical region: Secondary | ICD-10-CM

## 2013-01-12 DIAGNOSIS — M51369 Other intervertebral disc degeneration, lumbar region without mention of lumbar back pain or lower extremity pain: Secondary | ICD-10-CM

## 2013-01-12 DIAGNOSIS — D229 Melanocytic nevi, unspecified: Secondary | ICD-10-CM

## 2013-01-12 DIAGNOSIS — M5136 Other intervertebral disc degeneration, lumbar region: Secondary | ICD-10-CM

## 2013-01-12 MED ORDER — PHENTERMINE HCL 37.5 MG PO TABS: 37.5000 mg | ORAL_TABLET | Freq: Every day | ORAL | Status: DC

## 2013-01-12 NOTE — Progress Notes (Signed)
Subjective:    Patient ID: Monica [name], female    DOB: [birth year], 52 y.o.   MRN: [id number]  HPI Comments: 52 yo female concerned because cannot lose weight. She is eating healthier, smaller portions, more fiber, protein and exercising more x 6weeks. Her last labs WNL with TSH/ B12. She has researched wt loss RX and wants to try Phentermine to jump start wt loss.  She also wants moles removed. She notes moles are more irritated on neck, left buttock and left shin. She notes she has shved off or accidentally ripped off moles with clothing and jewelry but areas grow back and often bleed/ itch.    Current Outpatient Prescriptions on File Prior to Visit  Medication Sig Dispense Refill  . acyclovir (ZOVIRAX) 800 MG tablet Take 800 mg by mouth as needed.      . ALPRAZolam (XANAX) 0.5 MG tablet Take 1 tablet (0.5 mg total) by mouth 3 (three) times daily as needed for sleep or anxiety.  90 tablet  0  . Cholecalciferol (VITAMIN D-3 PO) Take by mouth.      . Cyanocobalamin (VITAMIN B-12 PO) Take by mouth.      . fish oil-omega-3 fatty acids 1000 MG capsule Take 1 g by mouth daily.      . Flaxseed, Linseed, (FLAXSEED OIL) 1200 MG CAPS Take by mouth daily.      . meclizine (ANTIVERT) 25 MG tablet Take 25 mg by mouth 3 (three) times daily as needed for dizziness.      . meloxicam (MOBIC) 15 MG tablet Take 15 mg by mouth daily.      . montelukast (SINGULAIR) 10 MG tablet Take 10 mg by mouth at bedtime.      . Varenicline Tartrate (CHANTIX PO) Take by mouth daily.       No current facility-administered medications on file prior to visit.   ALLERGIES Aspirin; Mobic; Wellbutrin; Z-pak; and Penicillins  Past Medical History  Diagnosis Date  . Lower back pain   . Neck pain   . Plantar fasciitis of left foot   . Plantar fasciitis of right foot   . Hypertension   . Hyperlipidemia   . IBS (irritable bowel syndrome)   . DDD (degenerative disc disease), cervical   . DDD (degenerative disc  disease), lumbar      Review of Systems  Constitutional: Positive for unexpected weight change.  Skin: Positive for color change and wound.       Atypical moles  All other systems reviewed and are negative.   BP 128/82  Pulse 72  Temp(Src) 98.6 F (37 C) (Temporal)  Resp 18  Ht 5\' 4"  (1.626 m)  Wt 184 lb (83.462 kg)  BMI 31.57 kg/m2  LMP 05/15/2010     Objective:   Physical Exam  Nursing note and vitals reviewed. Constitutional: She is oriented to person, place, and time. She appears well-developed and well-nourished. No distress.  HENT:  Head: Normocephalic and atraumatic.  Right Ear: External ear normal.  Left Ear: External ear normal.  Nose: Nose normal.  Mouth/Throat: Oropharynx is clear and moist.  Eyes: Conjunctivae and EOM are normal.  Neck: Normal range of motion. Neck supple. No JVD present. No thyromegaly present.  Cardiovascular: Normal rate, regular rhythm, normal heart sounds and intact distal pulses.   Pulmonary/Chest: Effort normal and breath sounds normal.  Abdominal: Soft. Bowel sounds are normal. She exhibits no distension. There is no tenderness.  Musculoskeletal: Normal range of motion. She exhibits no edema  and no tenderness.  Lymphadenopathy:    She has no cervical adenopathy.  Neurological: She is alert and oriented to person, place, and time. No cranial nerve deficit.  Skin: Skin is warm and dry. No rash noted. No erythema. No pallor.   Atypical mole base of neck- 3-4 mm elevated with multiple pigment variation  Atypical mole with irritation base of neck- 4 mm fleshy color elevation with scaling and erythema at base  Left posterior leg atypical mole with irritation- 4mm elevated scaling with erythema at base  Left anterior LE atypical mole- 3mm elevated firm almost nodular appearing lesion  Psychiatric: She has a normal mood and affect. Her behavior is normal. Judgment and thought content normal.          Assessment & Plan:  1. Overweight-  Long discussion about diet/ exercise. Will start short trial Phentermine 37.5 mg 1 qd # 30 given. Pt aware of risks. Will Recheck 1 month. Monitor BP cw/c with any SE/ conccerns 2. Mole removal- Verbal consent given- Areas cleaned with alcohol, 1 % lidocaine aprx .5cc each area used. All areas covered with neosporin/ gauze/ paper tape. Wound hygiene explained. 2a. Atypical mole base of neck- Shave excision with clear borders sent to pathology. 2b. Atypical mole with irritation base of neck- Electrocautery for removal 2c. Left posterior leg atypical mole with irritation- Electrocautery for removal 2d. Left anterior LE atypical mole- shave excisions  with clear borders sent for pathology

## 2013-01-12 NOTE — Patient Instructions (Addendum)
Exercise to Lose Weight Exercise and a healthy diet may help you lose weight. Your doctor may suggest specific exercises. EXERCISE IDEAS AND TIPS  Choose low-cost things you enjoy doing, such as walking, bicycling, or exercising to workout videos.  Take stairs instead of the elevator.  Walk during your lunch break.  Park your car further away from work or school.  Go to a gym or an exercise class.  Start with 5 to 10 minutes of exercise each day. Build up to 30 minutes of exercise 4 to 6 days a week.  Wear shoes with good support and comfortable clothes.  Stretch before and after working out.  Work out until you breathe harder and your heart beats faster.  Drink extra water when you exercise.  Do not do so much that you hurt yourself, feel dizzy, or get very short of breath. Exercises that burn about 150 calories:  Running 1  miles in 15 minutes.  Playing volleyball for 45 to 60 minutes.  Washing and waxing a car for 45 to 60 minutes.  Playing touch football for 45 minutes.  Walking 1  miles in 35 minutes.  Pushing a stroller 1  miles in 30 minutes.  Playing basketball for 30 minutes.  Raking leaves for 30 minutes.  Bicycling 5 miles in 30 minutes.  Walking 2 miles in 30 minutes.  Dancing for 30 minutes.  Shoveling snow for 15 minutes.  Swimming laps for 20 minutes.  Walking up stairs for 15 minutes.  Bicycling 4 miles in 15 minutes.  Gardening for 30 to 45 minutes.  Jumping rope for 15 minutes.  Washing windows or floors for 45 to 60 minutes. Document Released: 02/03/2010 Document Revised: 03/26/2011 Document Reviewed: 02/03/2010 [employer] Patient Information 2014 [employer]. Calorie Counting Diet A calorie counting diet requires you to eat the number of calories that are right for you in a day. Calories are the measurement of how much energy you get from the food you eat. Eating the right amount of calories is important for staying at a  healthy weight. If you eat too many calories, your body will store them as fat and you may gain weight. If you eat too few calories, you may lose weight. Counting the number of calories you eat during a day will help you know if you are eating the right amount. A Registered Dietitian can determine how many calories you need in a day. The amount of calories needed varies from person to person. If your goal is to lose weight, you will need to eat fewer calories. Losing weight can benefit you if you are overweight or have health problems such as heart disease, high blood pressure, or diabetes. If your goal is to gain weight, you will need to eat more calories. Gaining weight may be necessary if you have a certain health problem that causes your body to need more energy. TIPS Whether you are increasing or decreasing the number of calories you eat during a day, it may be hard to get used to changes in what you eat and drink. The following are tips to help you keep track of the number of calories you eat.  Measure foods at home with measuring cups. This helps you know the amount of food and number of calories you are eating.  Restaurants often serve food in amounts that are larger than 1 serving. While eating out, estimate how many servings of a food you are given. For example, a serving of cooked rice   is  cup or about the size of half of a fist. Knowing serving sizes will help you be aware of how much food you are eating at restaurants.  Ask for smaller portion sizes or child-size portions at restaurants.  Plan to eat half of a meal at a restaurant. Take the rest home or share the other half with a friend.  Read the Nutrition Facts panel on food labels for calorie content and serving size. You can find out how many servings are in a package, the size of a serving, and the number of calories each serving has.  For example, a package might contain 3 cookies. The Nutrition Facts panel on that package says  that 1 serving is 1 cookie. Below that, it will say there are 3 servings in the container. The calories section of the Nutrition Facts label says there are 90 calories. This means there are 90 calories in 1 cookie (1 serving). If you eat 1 cookie you have eaten 90 calories. If you eat all 3 cookies, you have eaten 270 calories (3 servings x 90 calories = 270 calories). The list below tells you how big or small some common portion sizes are.  1 oz.........4 stacked dice.  3 oz........Marland KitchenDeck of cards.  1 tsp.......Marland KitchenTip of little finger.  1 tbs......Marland KitchenMarland KitchenThumb.  2 tbs.......Marland KitchenGolf ball.   cup......Marland KitchenHalf of a fist.  1 cup.......Marland KitchenA fist. KEEP A FOOD LOG Write down every food item you eat, the amount you eat, and the number of calories in each food you eat during the day. At the end of the day, you can add up the total number of calories you have eaten. It may help to keep a list like the one below. Find out the calorie information by reading the Nutrition Facts panel on food labels. Breakfast  Bran cereal (1 cup, 110 calories).  Fat-free milk ( cup, 45 calories). Snack  Apple (1 medium, 80 calories). Lunch  Spinach (1 cup, 20 calories).  Tomato ( medium, 20 calories).  Chicken breast strips (3 oz, 165 calories).  Shredded cheddar cheese ( cup, 110 calories).  Light [place] dressing (2 tbs, 60 calories).  Whole-wheat bread (1 slice, 80 calories).  Tub margarine (1 tsp, 35 calories).  Vegetable soup (1 cup, 160 calories). Dinner  Pork chop (3 oz, 190 calories).  Brown rice (1 cup, 215 calories).  Steamed broccoli ( cup, 20 calories).  Strawberries (1  cup, 65 calories).  Whipped cream (1 tbs, 50 calories). Daily Calorie Total: 1425 Document Released: 01/01/2005 Document Revised: 03/26/2011 Document Reviewed: 06/28/2006 [facility] Patient Information 2014 [place], [place]. Wound Care Wound care helps prevent pain and infection.  You may need a tetanus shot if:  You  cannot remember when you had your last tetanus shot.  You have never had a tetanus shot.  The injury broke your skin. If you need a tetanus shot and you choose not to have one, you may get tetanus. Sickness from tetanus can be serious. HOME CARE   Only take medicine as told by your doctor.  Clean the wound daily with mild soap and water.  Change any bandages (dressings) as told by your doctor.  Put medicated cream and a bandage on the wound as told by your doctor.  Change the bandage if it gets wet, dirty, or starts to smell.  Take showers. Do not take baths, swim, or do anything that puts your wound under water.  Rest and raise (elevate) the wound until the pain and puffiness (swelling)  are better.  Keep all doctor visits as told. GET HELP RIGHT AWAY IF:   Yellowish-white fluid (pus) comes from the wound.  Medicine does not lessen your pain.  There is a red streak going away from the wound.  You have a fever. MAKE SURE YOU:   Understand these instructions.  Will watch your condition.  Will get help right away if you are not doing well or get worse. Document Released: 10/11/2007 Document Revised: 03/26/2011 Document Reviewed: 05/07/2010 [facility] Patient Information 2014 [place], [place].

## 2013-01-14 ENCOUNTER — Encounter: Payer: Self-pay | Admitting: Emergency Medicine

## 2013-01-14 DIAGNOSIS — R030 Elevated blood-pressure reading, without diagnosis of hypertension: Secondary | ICD-10-CM | POA: Insufficient documentation

## 2013-01-14 DIAGNOSIS — M5136 Other intervertebral disc degeneration, lumbar region: Secondary | ICD-10-CM | POA: Insufficient documentation

## 2013-01-14 DIAGNOSIS — E785 Hyperlipidemia, unspecified: Secondary | ICD-10-CM | POA: Insufficient documentation

## 2013-01-14 DIAGNOSIS — M5030 Other cervical disc degeneration, unspecified cervical region: Secondary | ICD-10-CM | POA: Insufficient documentation

## 2013-01-14 DIAGNOSIS — E559 Vitamin D deficiency, unspecified: Secondary | ICD-10-CM | POA: Insufficient documentation

## 2013-01-20 ENCOUNTER — Ambulatory Visit (INDEPENDENT_AMBULATORY_CARE_PROVIDER_SITE_OTHER): Payer: BC Managed Care – PPO | Admitting: Emergency Medicine

## 2013-01-20 ENCOUNTER — Encounter: Payer: Self-pay | Admitting: Emergency Medicine

## 2013-01-20 VITALS — BP 136/82 | HR 66 | Temp 98.0°F | Resp 18 | Ht 65.0 in | Wt 178.0 lb

## 2013-01-20 DIAGNOSIS — K5900 Constipation, unspecified: Secondary | ICD-10-CM

## 2013-01-20 DIAGNOSIS — R109 Unspecified abdominal pain: Secondary | ICD-10-CM

## 2013-01-20 MED ORDER — CIPROFLOXACIN HCL 500 MG PO TABS: 500.0000 mg | ORAL_TABLET | Freq: Two times a day (BID) | ORAL | Status: AC

## 2013-01-20 NOTE — Patient Instructions (Signed)
High-Fiber Diet Fiber is found in fruits, vegetables, and grains. A high-fiber diet encourages the addition of more whole grains, legumes, fruits, and vegetables in your diet. The recommended amount of fiber for adult males is 38 g per day. For adult females, it is 25 g per day. Pregnant and lactating women should get 28 g of fiber per day. If you have a digestive or bowel problem, ask your caregiver for advice before adding high-fiber foods to your diet. Eat a variety of high-fiber foods instead of only a select few type of foods.  PURPOSE  To increase stool bulk.  To make bowel movements more regular to prevent constipation.  To lower cholesterol.  To prevent overeating. WHEN IS THIS DIET USED?  It may be used if you have constipation and hemorrhoids.  It may be used if you have uncomplicated diverticulosis (intestine condition) and irritable bowel syndrome.  It may be used if you need help with weight management.  It may be used if you want to add it to your diet as a protective measure against atherosclerosis, diabetes, and cancer. SOURCES OF FIBER  Whole-grain breads and cereals.  Fruits, such as apples, oranges, bananas, berries, prunes, and pears.  Vegetables, such as green peas, carrots, sweet potatoes, beets, broccoli, cabbage, spinach, and artichokes.  Legumes, such split peas, soy, lentils.  Almonds. FIBER CONTENT IN FOODS Starches and Grains / Dietary Fiber (g)  Cheerios, 1 cup / 3 g  Corn Flakes cereal, 1 cup / 0.7 g  Rice crispy treat cereal, 1 cup / 0.3 g  Instant oatmeal (cooked),  cup / 2 g  Frosted wheat cereal, 1 cup / 5.1 g  Brown, long-grain rice (cooked), 1 cup / 3.5 g  White, long-grain rice (cooked), 1 cup / 0.6 g  Enriched macaroni (cooked), 1 cup / 2.5 g Legumes / Dietary Fiber (g)  Baked beans (canned, plain, or vegetarian),  cup / 5.2 g  Kidney beans (canned),  cup / 6.8 g  Pinto beans (cooked),  cup / 5.5 g Breads and Crackers  / Dietary Fiber (g)  Plain or honey graham crackers, 2 squares / 0.7 g  Saltine crackers, 3 squares / 0.3 g  Plain, salted pretzels, 10 pieces / 1.8 g  Whole-wheat bread, 1 slice / 1.9 g  White bread, 1 slice / 0.7 g  Raisin bread, 1 slice / 1.2 g  Plain bagel, 3 oz / 2 g  Flour tortilla, 1 oz / 0.9 g  Corn tortilla, 1 small / 1.5 g  Hamburger or hotdog bun, 1 small / 0.9 g Fruits / Dietary Fiber (g)  Apple with skin, 1 medium / 4.4 g  Sweetened applesauce,  cup / 1.5 g  Banana,  medium / 1.5 g  Grapes, 10 grapes / 0.4 g  Orange, 1 small / 2.3 g  Raisin, 1.5 oz / 1.6 g  Melon, 1 cup / 1.4 g Vegetables / Dietary Fiber (g)  Green beans (canned),  cup / 1.3 g  Carrots (cooked),  cup / 2.3 g  Broccoli (cooked),  cup / 2.8 g  Peas (cooked),  cup / 4.4 g  Mashed potatoes,  cup / 1.6 g  Lettuce, 1 cup / 0.5 g  Corn (canned),  cup / 1.6 g  Tomato,  cup / 1.1 g Document Released: 01/01/2005 Document Revised: 07/03/2011 Document Reviewed: 04/05/2011 [facility] Patient Information 2014 [place], [place]. Constipation, Adult Constipation is when a person:  Poops (bowel movement) less than 3 times a  week.  Has a hard time pooping.  Has poop that is dry, hard, or bigger than normal. HOME CARE   Eat more fiber, such as fruits, vegetables, whole grains like brown rice, and beans.  Eat less fatty foods and sugar. This includes [place] fries, hamburgers, cookies, candy, and soda.  If you are not getting enough fiber from food, take products with added fiber in them (supplements).  Drink enough fluid to keep your pee (urine) clear or pale yellow.  Go to the restroom when you feel like you need to poop. Do not hold it.  Only take medicine as told by your doctor. Do not take medicines that help you poop (laxatives) without talking to your doctor first.  Exercise on a regular basis, or as told by your doctor. GET HELP RIGHT AWAY IF:   You have bright red  blood in your poop (stool).  Your constipation lasts more than 4 days or gets worse.  You have belly (abdomen) or butt (rectal) pain.  You have thin poop (as thin as a pencil).  You lose weight, and it cannot be explained. MAKE SURE YOU:   Understand these instructions.  Will watch your condition.  Will get help right away if you are not doing well or get worse. Document Released: 06/20/2007 Document Revised: 03/26/2011 Document Reviewed: 12/05/2010 [facility] Patient Information 2014 [place], [place].

## 2013-01-20 NOTE — Progress Notes (Signed)
Subjective:    Patient ID: Monica [name], female    DOB: [birth year], 53 y.o.   MRN: [id number]  HPI Comments: 53 YO female started Phentermine for wt loss and started having more abdomen pain, constipation, bloating. She did not have relief with increased fiber, water and correctol. She started having left side pain and took more correctol and stopped Phentermine. She has had 2 normal BMs today and is feeling overall better.  Constipation Associated symptoms include abdominal pain.   Current Outpatient Prescriptions on File Prior to Visit  Medication Sig Dispense Refill  . acyclovir (ZOVIRAX) 800 MG tablet Take 800 mg by mouth as needed.      . ALPRAZolam (XANAX) 0.5 MG tablet Take 1 tablet (0.5 mg total) by mouth 3 (three) times daily as needed for sleep or anxiety.  90 tablet  0  . Cholecalciferol (VITAMIN D-3 PO) Take by mouth.      . Cyanocobalamin (VITAMIN B-12 PO) Take by mouth.      . fish oil-omega-3 fatty acids 1000 MG capsule Take 1 g by mouth daily.      . Flaxseed, Linseed, (FLAXSEED OIL) 1200 MG CAPS Take by mouth daily.      . meclizine (ANTIVERT) 25 MG tablet Take 25 mg by mouth 3 (three) times daily as needed for dizziness.      . meloxicam (MOBIC) 15 MG tablet Take 15 mg by mouth daily.      . montelukast (SINGULAIR) 10 MG tablet Take 10 mg by mouth at bedtime.      . phentermine (ADIPEX-P) 37.5 MG tablet Take 1 tablet (37.5 mg total) by mouth daily before breakfast.  30 tablet  0  . Varenicline Tartrate (CHANTIX PO) Take by mouth daily.       No current facility-administered medications on file prior to visit.   ALLERGIES Aspirin; Mobic; Wellbutrin; Z-pak; and Penicillins  Past Medical History  Diagnosis Date  . Lower back pain   . Neck pain   . Plantar fasciitis of left foot   . Plantar fasciitis of right foot   . Hypertension   . Hyperlipidemia   . IBS (irritable bowel syndrome)   . DDD (degenerative disc disease), cervical   . DDD (degenerative disc  disease), lumbar   . Unspecified vitamin D deficiency        Review of Systems  Gastrointestinal: Positive for abdominal pain, constipation and abdominal distention.  BP 136/82  Pulse 66  Temp(Src) 98 F (36.7 C) (Temporal)  Resp 18  Ht 5\' 5"  (1.651 m)  Wt 178 lb (80.74 kg)  BMI 29.62 kg/m2  LMP 05/15/2010      Objective:   Physical Exam  Nursing note and vitals reviewed. Constitutional: She is oriented to person, place, and time. She appears well-developed and well-nourished. No distress.  HENT:  Head: Normocephalic and atraumatic.  Right Ear: External ear normal.  Left Ear: External ear normal.  Nose: Nose normal.  Mouth/Throat: Oropharynx is clear and moist.  Eyes: Conjunctivae and EOM are normal.  Neck: Normal range of motion. Neck supple. No JVD present. No thyromegaly present.  Cardiovascular: Normal rate, regular rhythm, normal heart sounds and intact distal pulses.   Pulmonary/Chest: Effort normal and breath sounds normal.  Abdominal: Soft. Bowel sounds are normal. She exhibits no distension and no mass. There is tenderness. There is no rebound and no guarding.  Minimal LLQ Tenderness  Musculoskeletal: Normal range of motion. She exhibits no edema and no tenderness.  Lymphadenopathy:  She has no cervical adenopathy.  Neurological: She is alert and oriented to person, place, and time. No cranial nerve deficit.  Skin: Skin is warm and dry. No rash noted. No erythema. No pallor.  Psychiatric: She has a normal mood and affect. Her behavior is normal. Judgment and thought content normal.          Assessment & Plan:  1. Abdomen pain/ constipation with Phentermine-  Bland diet, small portions, Activia, Probiotics QD then next week Start 1/2 of Phentermine with at least 80 oz of water. If symptoms reoccur d/c Phentermine. Cipro 500 BID if sx increase

## 2013-01-28 ENCOUNTER — Encounter: Payer: Self-pay | Admitting: Internal Medicine

## 2013-02-11 ENCOUNTER — Ambulatory Visit: Payer: Self-pay | Admitting: Emergency Medicine

## 2013-02-12 ENCOUNTER — Ambulatory Visit: Payer: Self-pay | Admitting: Emergency Medicine

## 2013-02-18 ENCOUNTER — Encounter: Payer: Self-pay | Admitting: Emergency Medicine

## 2013-02-18 ENCOUNTER — Ambulatory Visit (INDEPENDENT_AMBULATORY_CARE_PROVIDER_SITE_OTHER): Payer: BC Managed Care – PPO | Admitting: Emergency Medicine

## 2013-02-18 VITALS — BP 106/68 | HR 78 | Temp 98.6°F | Resp 16 | Ht 64.25 in | Wt 175.0 lb

## 2013-02-18 DIAGNOSIS — Z780 Asymptomatic menopausal state: Secondary | ICD-10-CM

## 2013-02-18 DIAGNOSIS — E663 Overweight: Secondary | ICD-10-CM

## 2013-02-18 DIAGNOSIS — G479 Sleep disorder, unspecified: Secondary | ICD-10-CM

## 2013-02-18 DIAGNOSIS — R7309 Other abnormal glucose: Secondary | ICD-10-CM

## 2013-02-18 DIAGNOSIS — E782 Mixed hyperlipidemia: Secondary | ICD-10-CM

## 2013-02-18 DIAGNOSIS — Z111 Encounter for screening for respiratory tuberculosis: Secondary | ICD-10-CM

## 2013-02-18 DIAGNOSIS — Z79899 Other long term (current) drug therapy: Secondary | ICD-10-CM

## 2013-02-18 DIAGNOSIS — Z0000 Encounter for general adult medical examination without abnormal findings: Secondary | ICD-10-CM

## 2013-02-18 DIAGNOSIS — I10 Essential (primary) hypertension: Secondary | ICD-10-CM

## 2013-02-18 DIAGNOSIS — Z23 Encounter for immunization: Secondary | ICD-10-CM

## 2013-02-18 DIAGNOSIS — E559 Vitamin D deficiency, unspecified: Secondary | ICD-10-CM

## 2013-02-18 LAB — BASIC METABOLIC PANEL WITH GFR
BUN: 7 mg/dL (ref 6–23)
CALCIUM: 10 mg/dL (ref 8.4–10.5)
CO2: 23 meq/L (ref 19–32)
CREATININE: 0.58 mg/dL (ref 0.50–1.10)
Chloride: 105 mEq/L (ref 96–112)
GFR, Est Non African American: >89 mL/min
Glucose, Bld: 88 mg/dL (ref 70–99)
Potassium: 4.2 mEq/L (ref 3.5–5.3)
SODIUM: 139 meq/L (ref 135–145)

## 2013-02-18 LAB — HEPATIC FUNCTION PANEL
ALK PHOS: 104 U/L (ref 39–117)
ALT: <8 U/L (ref 0–35)
AST: 14 U/L (ref 0–37)
Albumin: 4.6 g/dL (ref 3.5–5.2)
BILIRUBIN DIRECT: 0.1 mg/dL (ref 0.0–0.3)
BILIRUBIN INDIRECT: 0.4 mg/dL (ref 0.2–1.2)
BILIRUBIN TOTAL: 0.5 mg/dL (ref 0.2–1.2)
TOTAL PROTEIN: 6.8 g/dL (ref 6.0–8.3)

## 2013-02-18 LAB — CBC WITH DIFFERENTIAL/PLATELET
BASOS PCT: 0 % (ref 0–1)
Basophils Absolute: 0 10*3/uL (ref 0.0–0.1)
EOS ABS: 0.1 10*3/uL (ref 0.0–0.7)
EOS PCT: 1 % (ref 0–5)
HCT: 41.5 % (ref 36.0–46.0)
Hemoglobin: 13.7 g/dL (ref 12.0–15.0)
LYMPHS ABS: 3.5 10*3/uL (ref 0.7–4.0)
Lymphocytes Relative: 43 % (ref 12–46)
MCH: 26.3 pg (ref 26.0–34.0)
MCHC: 33 g/dL (ref 30.0–36.0)
MCV: 79.7 fL (ref 78.0–100.0)
MONOS PCT: 7 % (ref 3–12)
Monocytes Absolute: 0.6 10*3/uL (ref 0.1–1.0)
Neutro Abs: 3.9 10*3/uL (ref 1.7–7.7)
Neutrophils Relative %: 49 % (ref 43–77)
PLATELETS: 253 10*3/uL (ref 150–400)
RBC: 5.21 MIL/uL — AB (ref 3.87–5.11)
RDW: 13.4 % (ref 11.5–15.5)
WBC: 8.1 10*3/uL (ref 4.0–10.5)

## 2013-02-18 LAB — LIPID PANEL
CHOL/HDL RATIO: 3.8 ratio
Cholesterol: 190 mg/dL (ref 0–200)
HDL: 50 mg/dL (ref >39)
LDL CALC: 115 mg/dL — AB (ref 0–99)
TRIGLYCERIDES: 125 mg/dL (ref <150)
VLDL: 25 mg/dL (ref 0–40)

## 2013-02-18 LAB — HEMOGLOBIN A1C
Hgb A1c MFr Bld: 5.5 % (ref <5.7)
Mean Plasma Glucose: 111 mg/dL (ref <117)

## 2013-02-18 LAB — TSH: TSH: 1.296 u[IU]/mL (ref 0.350–4.500)

## 2013-02-18 LAB — MAGNESIUM: Magnesium: 2 mg/dL (ref 1.5–2.5)

## 2013-02-18 MED ORDER — PHENTERMINE HCL 37.5 MG PO TABS: 37.5000 mg | ORAL_TABLET | Freq: Every day | ORAL | Status: DC

## 2013-02-18 NOTE — Patient Instructions (Addendum)
Pneumococcal Vaccine, Polyvalent solution for injection What is this medicine? PNEUMOCOCCAL VACCINE, POLYVALENT (NEU mo KOK al vak SEEN, pol ee VEY luhnt) is a vaccine to prevent pneumococcus bacteria infection. These bacteria are a major cause of ear infections, Strep throat infections, and serious pneumonia, meningitis, or blood infections worldwide. These vaccines help the body to produce antibodies (protective substances) that help your body defend against these bacteria. This vaccine is recommended for people 54 years of age and older with health problems. It is also recommended for all adults over 54 years old. This vaccine will not treat an infection. This medicine may be used for other purposes; ask your health care provider or pharmacist if you have questions. COMMON BRAND NAME(S): Pneumovax 23 What should I tell my health care provider before I take this medicine? They need to know if you have any of these conditions: -bleeding problems -bone marrow or organ transplant -cancer, Hodgkin's disease -fever -infection -immune system problems -low platelet count in the blood -seizures -an unusual or allergic reaction to pneumococcal vaccine, diphtheria toxoid, other vaccines, latex, other medicines, foods, dyes, or preservatives -pregnant or trying to get pregnant -breast-feeding How should I use this medicine? This vaccine is for injection into a muscle or under the skin. It is given by a health care professional. A copy of Vaccine Information Statements will be given before each vaccination. Read this sheet carefully each time. The sheet may change frequently. Talk to your pediatrician regarding the use of this medicine in children. While this drug may be prescribed for children as young as 54 years of age for selected conditions, precautions do apply. Overdosage: If you think you have taken too much of this medicine contact a poison control center or emergency room at once. NOTE: This  medicine is only for you. Do not share this medicine with others. What if I miss a dose? It is important not to miss your dose. Call your doctor or health care professional if you are unable to keep an appointment. What may interact with this medicine? -medicines for cancer chemotherapy -medicines that suppress your immune function -medicines that treat or prevent blood clots like warfarin, enoxaparin, and dalteparin -steroid medicines like prednisone or cortisone This list may not describe all possible interactions. Give your health care provider a list of all the medicines, herbs, non-prescription drugs, or dietary supplements you use. Also tell them if you smoke, drink alcohol, or use illegal drugs. Some items may interact with your medicine. What should I watch for while using this medicine? Mild fever and pain should go away in 3 days or less. Report any unusual symptoms to your doctor or health care professional. What side effects may I notice from receiving this medicine? Side effects that you should report to your doctor or health care professional as soon as possible: -allergic reactions like skin rash, itching or hives, swelling of the face, lips, or tongue -breathing problems -confused -fever over 102 degrees F -pain, tingling, numbness in the hands or feet -seizures -unusual bleeding or bruising -unusual muscle weakness Side effects that usually do not require medical attention (report to your doctor or health care professional if they continue or are bothersome): -aches and pains -diarrhea -fever of 102 degrees F or less -headache -irritable -loss of appetite -pain, tender at site where injected -trouble sleeping This list may not describe all possible side effects. Call your doctor for medical advice about side effects. You may report side effects to FDA at 1-800-FDA-1088. Where should  I keep my medicine? This does not apply. This vaccine is given in a clinic, pharmacy,  doctor's office, or other health care setting and will not be stored at home. NOTE: This sheet is a summary. It may not cover all possible information. If you have questions about this medicine, talk to your doctor, pharmacist, or health care provider.  2014, Elsevier/Gold Standard. (2007-08-08 14:32:37) Sleep Apnea Sleep apnea is disorder that affects a person's sleep. A person with sleep apnea has abnormal pauses in their breathing when they sleep. It is hard for them to get a good sleep. This makes a person tired during the day. It also can lead to other physical problems. There are three types of sleep apnea. One type is when breathing stops for a short time because your airway is blocked (obstructive sleep apnea). Another type is when the brain sometimes fails to give the normal signal to breathe to the muscles that control your breathing (central sleep apnea). The third type is a combination of the other two types. HOME CARE  Do not sleep on your back. Try to sleep on your side.  Take all medicine as told by your doctor.  Avoid alcohol, calming medicines (sedatives), and depressant drugs.  Try to lose weight if you are overweight. Talk to your doctor about a healthy weight goal. Your doctor may have you use a device that helps to open your airway. It can help you get the air that you need. It is called a positive airway pressure (PAP) device. There are three types of PAP devices:  Continuous positive airway pressure (CPAP) device.  Nasal expiratory positive airway pressure (EPAP) device.  Bilevel positive airway pressure (BPAP) device. MAKE SURE YOU:  Understand these instructions.  Will watch your condition.  Will get help right away if you are not doing well or get worse. Document Released: 10/11/2007 Document Revised: 12/19/2011 Document Reviewed: 05/05/2011 [facility] Patient Information 2014 [place]. Insomnia Insomnia is frequent trouble falling and/or staying asleep.  Insomnia can be a long term problem or a short term problem. Both are common. Insomnia can be a short term problem when the wakefulness is related to a certain stress or worry. Long term insomnia is often related to ongoing stress during waking hours and/or poor sleeping habits. Overtime, sleep deprivation itself can make the problem worse. Every little thing feels more severe because you are overtired and your ability to cope is decreased. CAUSES   Stress, anxiety, and depression.  Poor sleeping habits.  Distractions such as TV in the bedroom.  Naps close to bedtime.  Engaging in emotionally charged conversations before bed.  Technical reading before sleep.  Alcohol and other sedatives. They may make the problem worse. They can hurt normal sleep patterns and normal dream activity.  Stimulants such as caffeine for several hours prior to bedtime.  Pain syndromes and shortness of breath can cause insomnia.  Exercise late at night.  Changing time zones may cause sleeping problems (jet lag). It is sometimes helpful to have someone observe your sleeping patterns. They should look for periods of not breathing during the night (sleep apnea). They should also look to see how long those periods last. If you live alone or observers are uncertain, you can also be observed at a sleep clinic where your sleep patterns will be professionally monitored. Sleep apnea requires a checkup and treatment. Give your caregivers your medical history. Give your caregivers observations your family has made about your sleep.  SYMPTOMS  Not feeling rested in the morning.  Anxiety and restlessness at bedtime.  Difficulty falling and staying asleep. TREATMENT   Your caregiver may prescribe treatment for an underlying medical disorders. Your caregiver can give advice or help if you are using alcohol or other drugs for self-medication. Treatment of underlying problems will usually eliminate insomnia  problems.  Medications can be prescribed for short time use. They are generally not recommended for lengthy use.  Over-the-counter sleep medicines are not recommended for lengthy use. They can be habit forming.  You can promote easier sleeping by making lifestyle changes such as:  Using relaxation techniques that help with breathing and reduce muscle tension.  Exercising earlier in the day.  Changing your diet and the time of your last meal. No night time snacks.  Establish a regular time to go to bed.  Counseling can help with stressful problems and worry.  Soothing music and white noise may be helpful if there are background noises you cannot remove.  Stop tedious detailed work at least one hour before bedtime. HOME CARE INSTRUCTIONS   Keep a diary. Inform your caregiver about your progress. This includes any medication side effects. See your caregiver regularly. Take note of:  Times when you are asleep.  Times when you are awake during the night.  The quality of your sleep.  How you feel the next day. This information will help your caregiver care for you.  Get out of bed if you are still awake after 15 minutes. Read or do some quiet activity. Keep the lights down. Wait until you feel sleepy and go back to bed.  Keep regular sleeping and waking hours. Avoid naps.  Exercise regularly.  Avoid distractions at bedtime. Distractions include watching television or engaging in any intense or detailed activity like attempting to balance the household checkbook.  Develop a bedtime ritual. Keep a familiar routine of bathing, brushing your teeth, climbing into bed at the same time each night, listening to soothing music. Routines increase the success of falling to sleep faster.  Use relaxation techniques. This can be using breathing and muscle tension release routines. It can also include visualizing peaceful scenes. You can also help control troubling or intruding thoughts by  keeping your mind occupied with boring or repetitive thoughts like the old concept of counting sheep. You can make it more creative like imagining planting one beautiful flower after another in your backyard garden.  During your day, work to eliminate stress. When this is not possible use some of the previous suggestions to help reduce the anxiety that accompanies stressful situations. MAKE SURE YOU:   Understand these instructions.  Will watch your condition.  Will get help right away if you are not doing well or get worse. Document Released: 12/30/1999 Document Revised: 03/26/2011 Document Reviewed: 01/29/2007 [facility] Patient Information 2014 [place].

## 2013-02-18 NOTE — Progress Notes (Addendum)
Subjective:    Patient ID: Monica [name], female    DOB: February 10, 1960, 53 y.o.   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 Deficient. LAST LABS IRON 15  T 200 TG 150 H 58 L 112  A1C 5.7 CK 69  B12 412 MAG 2 D 46 INSULIN 6 She is on 1st Rx Phentermine she is only able to tolerate 1/2 tabs due to GI issues with whole tab. She has lost 11 # in 5 weeks. She is eating healthier/ smaller portions. She is exercising off/on. She has been maintaining cholesterol with vitamins.   She has been managing stress/ anxiety only takes Xanax PRN. She takes Mobic which helps with arthritis pain with food and water and denies any GI symptoms currently.  She has not been sleeping well on/ off for some time. She notes she has frequent awakenings and occasional difficulty falling back asleep. She does have mild fatigue when symptoms are worse. Her father does have HX of OSA.  She has GYN appointment next week for annual visit. She notes Mammo 12/14 WNL. She has not had a BMD and has + FMX with mother with Osteoporosis. She is otherwise up to date with all f/u except uncertain of Colonoscopy due date. LAst colon 2003 WNL Dr Carvel Getting.  Hyperlipidemia   Current Outpatient Prescriptions on File Prior to Visit  Medication Sig Dispense Refill  . acyclovir (ZOVIRAX) 800 MG tablet Take 800 mg by mouth as needed.      . ALPRAZolam (XANAX) 0.5 MG tablet Take 1 tablet (0.5 mg total) by mouth 3 (three) times daily as needed for sleep or anxiety.  90 tablet  0  . Cholecalciferol (VITAMIN D-3 PO) Take by mouth.      . Cyanocobalamin (VITAMIN B-12 PO) Take by mouth.      . fish oil-omega-3 fatty acids 1000 MG capsule Take 1 g by mouth daily.      . Flaxseed, Linseed, (FLAXSEED OIL) 1200 MG CAPS Take by mouth daily.      . meclizine (ANTIVERT) 25 MG tablet Take 25 mg by mouth 3 (three) times daily as needed for dizziness.      . meloxicam (MOBIC) 15 MG tablet Take  15 mg by mouth daily.      . montelukast (SINGULAIR) 10 MG tablet Take 10 mg by mouth at bedtime.       No current facility-administered medications on file prior to visit.   Allergies  Allergen Reactions  . Aspirin Other (See Comments)    GI upset  . Mobic [Meloxicam] Other (See Comments)    GI upset  . Wellbutrin [Bupropion] Other (See Comments)    headache  . Z-Pak [Azithromycin] Other (See Comments)    thrush  . Penicillins Rash   Past Medical History  Diagnosis Date  . Lower back pain   . Neck pain   . Plantar fasciitis of left foot   . Plantar fasciitis of right foot   . Hypertension   . Hyperlipidemia   . IBS (irritable bowel syndrome)   . DDD (degenerative disc disease), cervical   . DDD (degenerative disc disease), lumbar   . Unspecified vitamin D deficiency    Past Surgical History  Procedure Laterality Date  . Lower back      L4 and L5   . Neck surgery      metal plate and pins in neck  . Plantar fascia  surgery      both feet  . Cesarean section     History  Substance Use Topics  . Smoking status: Current Every Day Smoker  . Smokeless tobacco: Not on file  . Alcohol Use: No   Family History  Problem Relation Age of Onset  . Heart disease Father   . Diabetes Father   . Hyperlipidemia Father   . Hypertension Father   . Cervical cancer Sister   . Hyperlipidemia Mother   . Hypertension Mother   . Stroke Maternal Aunt   . Cancer Maternal Aunt     breast x 2 different aunts  . Heart disease Maternal Grandmother   . Heart disease Maternal Grandfather       Review of Systems  Constitutional: Positive for fatigue.  Eyes:       2014 wnl  Gastrointestinal:       LEBAURER COLON WNL PER PT   Genitourinary:       GYN- [facility] DUE NEXT WEEK   Musculoskeletal: Positive for arthralgias.  Skin:       SKIN SURG CENT 2014 WNL  Psychiatric/Behavioral: Positive for sleep disturbance. The patient is nervous/anxious.   All other systems reviewed and  are negative.   BP 106/68  Pulse 78  Temp(Src) 98.6 F (37 C) (Temporal)  Resp 16  Ht 5' 4.25" (1.632 m)  Wt 175 lb (79.379 kg)  BMI 29.80 kg/m2  LMP 05/15/2010     Objective:   Physical Exam  Nursing note and vitals reviewed. Constitutional: She is oriented to person, place, and time. She appears well-developed and well-nourished. No distress.  HENT:  Head: Normocephalic and atraumatic.  Right Ear: External ear normal.  Left Ear: External ear normal.  Nose: Nose normal.  Mouth/Throat: Oropharynx is clear and moist. No oropharyngeal exudate.  Eyes: Conjunctivae and EOM are normal. Pupils are equal, round, and reactive to light. Right eye exhibits no discharge. Left eye exhibits no discharge. No scleral icterus.  Neck: Normal range of motion. Neck supple. No JVD present. No tracheal deviation present. No thyromegaly present.  Cardiovascular: Normal rate, regular rhythm, normal heart sounds and intact distal pulses.   Pulmonary/Chest: Effort normal and breath sounds normal.  Abdominal: Soft. Bowel sounds are normal. She exhibits no distension and no mass. There is no tenderness. There is no rebound and no guarding.  Genitourinary:  Breasts WNL GYN Def  Musculoskeletal: Normal range of motion. She exhibits no edema and no tenderness.  Lymphadenopathy:    She has no cervical adenopathy.  Neurological: She is alert and oriented to person, place, and time. She has normal reflexes. No cranial nerve deficit. She exhibits normal muscle tone. Coordination normal.  Skin: Skin is warm and dry. No rash noted. No erythema. No pallor.  Psychiatric: She has a normal mood and affect. Her behavior is normal. Judgment and thought content normal.     AORTA SCAN WNL EKG REVIEWED FROM PHENTERMINE START WNL     Assessment & Plan:  1. CPE- Update screening labs/ History/ Immunizations/ Testing as needed. Advised healthy diet, QD exercise, increase H20 and continue RX/ Vitamins AD. Update BMD/  colonoscopy. 2. 3 month F/U for Cholesterol, Pre-Dm, D. Deficient. Needs healthy diet, cardio QD and obtain healthy weight. Check Labs, Check BP if >130/80 call office 3. Overweight- Refill #2 Phentermine 37.5 tab continue 1/2 qd and improved diet and exercise increase 4.  Insomnia- Sleep hygiene discussed, increase daytime activity level call if no improvement with Sleep disturbance-  Get sleep study

## 2013-02-19 LAB — MICROALBUMIN / CREATININE URINE RATIO
CREATININE, URINE: 185.7 mg/dL
MICROALB UR: 0.97 mg/dL (ref 0.00–1.89)
Microalb Creat Ratio: 5.2 mg/g (ref 0.0–30.0)

## 2013-02-19 LAB — URINALYSIS, ROUTINE W REFLEX MICROSCOPIC
Bilirubin Urine: NEGATIVE
Glucose, UA: NEGATIVE mg/dL
Hgb urine dipstick: NEGATIVE
Ketones, ur: NEGATIVE mg/dL
Leukocytes, UA: NEGATIVE
NITRITE: NEGATIVE
Protein, ur: NEGATIVE mg/dL
SPECIFIC GRAVITY, URINE: 1.016 (ref 1.005–1.030)
UROBILINOGEN UA: 0.2 mg/dL (ref 0.0–1.0)
pH: 6 (ref 5.0–8.0)

## 2013-02-19 LAB — VITAMIN D 25 HYDROXY (VIT D DEFICIENCY, FRACTURES): Vit D, 25-Hydroxy: 55 ng/mL (ref 30–89)

## 2013-02-19 LAB — INSULIN, FASTING: INSULIN FASTING, SERUM: 3 u[IU]/mL (ref 3–28)

## 2013-02-20 LAB — TB SKIN TEST
INDURATION: 0 mm
TB SKIN TEST: NEGATIVE

## 2013-04-21 ENCOUNTER — Ambulatory Visit (INDEPENDENT_AMBULATORY_CARE_PROVIDER_SITE_OTHER): Payer: BC Managed Care – PPO | Admitting: *Deleted

## 2013-04-21 ENCOUNTER — Encounter: Payer: Self-pay | Admitting: *Deleted

## 2013-04-21 VITALS — BP 104/70 | HR 86 | Temp 98.2°F | Resp 16 | Ht 64.25 in | Wt 165.0 lb

## 2013-04-21 DIAGNOSIS — E663 Overweight: Secondary | ICD-10-CM

## 2013-04-21 MED ORDER — PHENTERMINE HCL 37.5 MG PO TABS: 37.5000 mg | ORAL_TABLET | Freq: Every day | ORAL | Status: DC

## 2013-04-21 NOTE — Progress Notes (Signed)
Patient ID: Monica [name], female   DOB: Jul 23, 1960, 53 y.o.   MRN: [id number] Patient presents for 2 month recheck weight and BP with refill on Phentermine Rx.  Patient denies any negative side effects, and has lost 10 lbs since 02/18/13.

## 2013-05-16 ENCOUNTER — Encounter: Payer: Self-pay | Admitting: Internal Medicine

## 2013-05-17 ENCOUNTER — Other Ambulatory Visit: Payer: Self-pay | Admitting: Physician Assistant

## 2013-05-17 MED ORDER — MONTELUKAST SODIUM 10 MG PO TABS: 10.0000 mg | ORAL_TABLET | Freq: Every day | ORAL | Status: DC

## 2013-06-19 ENCOUNTER — Encounter: Payer: Self-pay | Admitting: Emergency Medicine

## 2013-06-19 ENCOUNTER — Ambulatory Visit (INDEPENDENT_AMBULATORY_CARE_PROVIDER_SITE_OTHER): Payer: BC Managed Care – PPO | Admitting: Emergency Medicine

## 2013-06-19 VITALS — BP 110/70 | HR 88 | Temp 98.2°F | Resp 16 | Ht 64.25 in | Wt 163.0 lb

## 2013-06-19 DIAGNOSIS — M7061 Trochanteric bursitis, right hip: Secondary | ICD-10-CM

## 2013-06-19 DIAGNOSIS — R7309 Other abnormal glucose: Secondary | ICD-10-CM

## 2013-06-19 DIAGNOSIS — M76899 Other specified enthesopathies of unspecified lower limb, excluding foot: Secondary | ICD-10-CM

## 2013-06-19 DIAGNOSIS — E782 Mixed hyperlipidemia: Secondary | ICD-10-CM

## 2013-06-19 LAB — HEMOGLOBIN A1C
HEMOGLOBIN A1C: 5.5 % (ref <5.7)
Mean Plasma Glucose: 111 mg/dL (ref <117)

## 2013-06-19 MED ORDER — VARENICLINE TARTRATE 1 MG PO TABS: 1.0000 mg | ORAL_TABLET | Freq: Two times a day (BID) | ORAL | Status: DC

## 2013-06-19 MED ORDER — METHYLPREDNISOLONE (PAK) 4 MG PO TABS: 4.0000 mg | ORAL_TABLET | Freq: Every day | ORAL | Status: DC

## 2013-06-19 NOTE — Progress Notes (Signed)
Subjective:    Patient ID: Monica [name], female    DOB: [birth year], 53 y.o.   MRN: [id number]  HPI Comments: 53 yo WF presents for 3 month F/U for labile HTN, Cholesterol, Pre-Dm, D. deficient She is eating better. She has not ever been on HTN RX. 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 She stopped diet medicine because of constipation issues.She notes right side low hip to knee pain. She notes pain comes after prolonged sitting. She notes improvement with walking around. She denies any injury/ strain. She notes tender to lay on right side.  WBC             8.1   02/18/2013 HGB            13.7   02/18/2013 HCT            41.5   02/18/2013 PLT             253   02/18/2013 GLUCOSE          88   02/18/2013 CHOL            190   02/18/2013 TRIG            125   02/18/2013 HDL              50   02/18/2013 LDLCALC         115   02/18/2013 ALT              <8   02/18/2013 AST              14   02/18/2013 NA              139   02/18/2013 K               4.2   02/18/2013 CL              105   02/18/2013 CREATININE     0.58   02/18/2013 BUN               7   02/18/2013 CO2              23   02/18/2013 TSH           1.296   02/18/2013 HGBA1C          5.5   02/18/2013 MICROALBUR     0.97   02/18/2013   Hyperlipidemia Associated symptoms include myalgias.  Anxiety       Medication List       This list is accurate as of: 06/19/13  9:19 AM.  Always use your most recent med list.               acyclovir 800 MG tablet  Commonly known as:  ZOVIRAX  Take 800 mg by mouth as needed.     ALPRAZolam 0.5 MG tablet  Commonly known as:  XANAX  Take 1 tablet (0.5 mg total) by mouth 3 (three) times daily as needed for sleep or anxiety.     fish oil-omega-3 fatty acids 1000 MG capsule  Take 1 g by mouth daily.     Flaxseed Oil 1200 MG Caps  Take by mouth daily.     meclizine 25 MG tablet  Commonly known as:  ANTIVERT  Take 25 mg by mouth 3 (three) times daily as needed for dizziness.     meloxicam 15 MG  tablet  Commonly known  as:  MOBIC  Take 15 mg by mouth daily.     montelukast 10 MG tablet  Commonly known as:  SINGULAIR  Take 1 tablet (10 mg total) by mouth at bedtime.     VITAMIN B-12 PO  Take by mouth.     VITAMIN D-3 PO  Take by mouth.       Allergies  Allergen Reactions  . Aspirin Other (See Comments)    GI upset  . Mobic [Meloxicam] Other (See Comments)    GI upset  . Wellbutrin [Bupropion] Other (See Comments)    headache  . Z-Pak [Azithromycin] Other (See Comments)    thrush  . Penicillins Rash   Past Medical History  Diagnosis Date  . Lower back pain   . Neck pain   . Plantar fasciitis of left foot   . Plantar fasciitis of right foot   . Hypertension   . Hyperlipidemia   . IBS (irritable bowel syndrome)   . DDD (degenerative disc disease), cervical   . DDD (degenerative disc disease), lumbar   . Unspecified vitamin D deficiency      Review of Systems  Musculoskeletal: Positive for myalgias.  All other systems reviewed and are negative.  BP 110/70  Pulse 88  Temp(Src) 98.2 F (36.8 C) (Temporal)  Resp 16  Ht 5' 4.25" (1.632 m)  Wt 163 lb (73.936 kg)  BMI 27.76 kg/m2  LMP 05/15/2010     Objective:   Physical Exam  Nursing note and vitals reviewed. Constitutional: She is oriented to person, place, and time. She appears well-developed and well-nourished. No distress.  HENT:  Head: Normocephalic and atraumatic.  Right Ear: External ear normal.  Left Ear: External ear normal.  Nose: Nose normal.  Mouth/Throat: Oropharynx is clear and moist.  Eyes: Conjunctivae and EOM are normal.  Neck: Normal range of motion. Neck supple. No JVD present. No thyromegaly present.  Cardiovascular: Normal rate, regular rhythm, normal heart sounds and intact distal pulses.   Pulmonary/Chest: Effort normal and breath sounds normal.  Abdominal: Soft. Bowel sounds are normal. She exhibits no distension and no mass. There is no tenderness. There is no rebound and  no guarding.  Musculoskeletal: Normal range of motion. She exhibits tenderness. She exhibits no edema.  Right trochanteric bursa, + crepitus right Knee  Lymphadenopathy:    She has no cervical adenopathy.  Neurological: She is alert and oriented to person, place, and time. No cranial nerve deficit.  Skin: Skin is warm and dry. No rash noted. No erythema. No pallor.  Psychiatric: She has a normal mood and affect. Her behavior is normal. Judgment and thought content normal.          Assessment & Plan:  1.  3 month F/U for labile HTN, Cholesterol, Pre-Dm, D. Deficient. Needs healthy diet, cardio QD and obtain healthy weight. Check Labs, Check BP if >130/80 call office   2. ? Right trochanteric bursitis vs arthritis ? psoriatic- Medrol DP 4 mg AD  3. Tobacco Dep- advised cessation techniques and need for d/c to decrease Risk, restart Chantix AD

## 2013-06-19 NOTE — Patient Instructions (Signed)
Smoking Cessation, Tips for Success If you are ready to quit smoking, congratulations! You have chosen to help yourself be healthier. Cigarettes bring nicotine, tar, carbon monoxide, and other irritants into your body. Your lungs, heart, and blood vessels will be able to work better without these poisons. There are many different ways to quit smoking. Nicotine gum, nicotine patches, a nicotine inhaler, or nicotine nasal spray can help with physical craving. Hypnosis, support groups, and medicines help break the habit of smoking. WHAT THINGS CAN I DO TO MAKE QUITTING EASIER?  Here are some tips to help you quit for good:  Pick a date when you will quit smoking completely. Tell all of your friends and family about your plan to quit on that date.  Do not try to slowly cut down on the number of cigarettes you are smoking. Pick a quit date and quit smoking completely starting on that day.  Throw away all cigarettes.   Clean and remove all ashtrays from your home, work, and car.   On a card, write down your reasons for quitting. Carry the card with you and read it when you get the urge to smoke.   Cleanse your body of nicotine. Drink enough water and fluids to keep your urine clear or pale yellow. Do this after quitting to flush the nicotine from your body.   Learn to predict your moods. Do not let a bad situation be your excuse to have a cigarette. Some situations in your life might tempt you into wanting a cigarette.   Never have "just one" cigarette. It leads to wanting another and another. Remind yourself of your decision to quit.   Change habits associated with smoking. If you smoked while driving or when feeling stressed, try other activities to replace smoking. Stand up when drinking your coffee. Brush your teeth after eating. Sit in a different chair when you read the paper. Avoid alcohol while trying to quit, and try to drink fewer caffeinated beverages. Alcohol and caffeine may urge  you to smoke.   Avoid foods and drinks that can trigger a desire to smoke, such as sugary or spicy foods and alcohol.   Ask people who smoke not to smoke around you.   Have something planned to do right after eating or having a cup of coffee. For example, plan to take a walk or exercise.   Try a relaxation exercise to calm you down and decrease your stress. Remember, you may be tense and nervous for the first 2 weeks after you quit, but this will pass.   Find new activities to keep your hands busy. Play with a pen, coin, or rubber band. Doodle or draw things on paper.   Brush your teeth right after eating. This will help cut down on the craving for the taste of tobacco after meals. You can also try mouthwash.   Use oral substitutes in place of cigarettes. Try using lemon drops, carrots, cinnamon sticks, or chewing gum. Keep them handy so they are available when you have the urge to smoke.   When you have the urge to smoke, try deep breathing.   Designate your home as a nonsmoking area.   If you are a heavy smoker, ask your health care provider about a prescription for nicotine chewing gum. It can ease your withdrawal from nicotine.   Reward yourself. Set aside the cigarette money you save and buy yourself something nice.   Look for support from others. Join a support group or   smoking cessation program. Ask someone at home or at work to help you with your plan to quit smoking.   Always ask yourself, "Do I need this cigarette or is this just a reflex?" Tell yourself, "Today, I choose not to smoke," or "I do not want to smoke." You are reminding yourself of your decision to quit.  Do not replace cigarette smoking with electronic cigarettes (commonly called e-cigarettes). The safety of e-cigarettes is unknown, and some may contain harmful chemicals.  If you relapse, do not give up! Plan ahead and think about what you will do the next time you get the urge to smoke.  HOW WILL  I FEEL WHEN I QUIT SMOKING? You may have symptoms of withdrawal because your body is used to nicotine (the addictive substance in cigarettes). You may crave cigarettes, be irritable, feel very hungry, cough often, get headaches, or have difficulty concentrating. The withdrawal symptoms are only temporary. They are strongest when you first quit but will go away within 10 14 days. When withdrawal symptoms occur, stay in control. Think about your reasons for quitting. Remind yourself that these are signs that your body is healing and getting used to being without cigarettes. Remember that withdrawal symptoms are easier to treat than the major diseases that smoking can cause.  Even after the withdrawal is over, expect periodic urges to smoke. However, these cravings are generally short lived and will go away whether you smoke or not. Do not smoke!  WHAT RESOURCES ARE AVAILABLE TO HELP ME QUIT SMOKING? Your health care provider can direct you to community resources or hospitals for support, which may include:  Group support.  Education.  Hypnosis.  Therapy. Document Released: 09/30/2003 Document Revised: 10/22/2012 Document Reviewed: 06/19/2012 [facility] Patient Information 2014 [place], [place]. Trochanteric Bursitis You have hip pain due to trochanteric bursitis. Bursitis means that the sack near the outside of the hip is filled with fluid and inflamed. This sack is made up of protective soft tissue. The pain from trochanteric bursitis can be severe and keep you from sleep. It can radiate to the buttocks or down the outside of the thigh to the knee. The pain is almost always worse when rising from the seated or lying position and with walking. Pain can improve after you take a few steps. It happens more often in people with hip joint and lumbar spine problems, such as arthritis or previous surgery. Very rarely the trochanteric bursa can become infected, and antibiotics and/or surgery may be  needed. Treatment often includes an injection of local anesthetic mixed with cortisone medicine. This medicine is injected into the area where it is most tender over the hip. Repeat injections may be necessary if the response to treatment is slow. You can apply ice packs over the tender area for 30 minutes every 2 hours for the next few days. Anti-inflammatory and/or narcotic pain medicine may also be helpful. Limit your activity for the next few days if the pain continues. See your caregiver in 5-10 days if you are not greatly improved.  SEEK IMMEDIATE MEDICAL CARE IF:  You develop severe pain, fever, or increased redness.  You have pain that radiates below the knee. EXERCISES STRETCHING EXERCISES - Trochantic Bursitis  These exercises may help you when beginning to rehabilitate your injury. Your symptoms may resolve with or without further involvement from your physician, physical therapist or athletic trainer. While completing these exercises, remember:   Restoring tissue flexibility helps normal motion to return to the joints. This  allows healthier, less painful movement and activity.  An effective stretch should be held for at least 30 seconds.  A stretch should never be painful. You should only feel a gentle lengthening or release in the stretched tissue. STRETCH  Iliotibial Band  On the floor or bed, lie on your side so your injured leg is on top. Bend your knee and grab your ankle.  Slowly bring your knee back so that your thigh is in line with your trunk. Keep your heel at your buttocks and gently arch your back so your head, shoulders and hips line up.  Slowly lower your leg so that your knee approaches the floor/bed until you feel a gentle stretch on the outside of your thigh. If you do not feel a stretch and your knee will not fall farther, place the heel of your opposite foot on top of your knee and pull your thigh down farther.  Hold this stretch for __________  seconds.  Repeat __________ times. Complete this exercise __________ times per day. STRETCH Hamstrings, Supine   Lie on your back. Loop a belt or towel over the ball of your foot as shown.  Straighten your knee and slowly pull on the belt to raise your injured leg. Do not allow the knee to bend. Keep your opposite leg flat on the floor.  Raise the leg until you feel a gentle stretch behind your knee or thigh. Hold this position for __________ seconds.  Repeat __________ times. Complete this stretch __________ times per day. STRETCH - Quadriceps, Prone   Lie on your stomach on a firm surface, such as a bed or padded floor.  Bend your knee and grasp your ankle. If you are unable to reach, your ankle or pant leg, use a belt around your foot to lengthen your reach.  Gently pull your heel toward your buttocks. Your knee should not slide out to the side. You should feel a stretch in the front of your thigh and/or knee.  Hold this position for __________ seconds.  Repeat __________ times. Complete this stretch __________ times per day. STRETCHING - Hip Flexors, Lunge Half kneel with your knee on the floor and your opposite knee bent and directly over your ankle.  Keep good posture with your head over your shoulders. Tighten your buttocks to point your tailbone downward; this will prevent your back from arching too much.  You should feel a gentle stretch in the front of your thigh and/or hip. If you do not feel any resistance, slightly slide your opposite foot forward and then slowly lunge forward so your knee once again lines up over your ankle. Be sure your tailbone remains pointed downward.  Hold this stretch for __________ seconds.  Repeat __________ times. Complete this stretch __________ times per day. STRETCH - Adductors, Lunge  While standing, spread your legs  Lean away from your injured leg by bending your opposite knee. You may rest your hands on your thigh for balance.  You  should feel a stretch in your inner thigh. Hold for __________ seconds.  Repeat __________ times. Complete this exercise __________ times per day. Document Released: 02/09/2004 Document Revised: 03/26/2011 Document Reviewed: 04/15/2008 [facility] Patient Information 2014 [place], [place].

## 2013-06-20 LAB — HEPATIC FUNCTION PANEL
ALK PHOS: 106 U/L (ref 39–117)
ALT: <8 U/L (ref 0–35)
AST: 12 U/L (ref 0–37)
Albumin: 4.5 g/dL (ref 3.5–5.2)
BILIRUBIN DIRECT: 0.1 mg/dL (ref 0.0–0.3)
BILIRUBIN TOTAL: 0.5 mg/dL (ref 0.2–1.2)
Indirect Bilirubin: 0.4 mg/dL (ref 0.2–1.2)
Total Protein: 7 g/dL (ref 6.0–8.3)

## 2013-06-20 LAB — LIPID PANEL
CHOL/HDL RATIO: 3.6 ratio
Cholesterol: 192 mg/dL (ref 0–200)
HDL: 54 mg/dL (ref >39)
LDL Cholesterol: 119 mg/dL — ABNORMAL HIGH (ref 0–99)
Triglycerides: 94 mg/dL (ref <150)
VLDL: 19 mg/dL (ref 0–40)

## 2013-06-20 LAB — BASIC METABOLIC PANEL WITH GFR
BUN: 7 mg/dL (ref 6–23)
CO2: 27 mEq/L (ref 19–32)
Calcium: 9.8 mg/dL (ref 8.4–10.5)
Chloride: 101 mEq/L (ref 96–112)
Creat: 0.54 mg/dL (ref 0.50–1.10)
Glucose, Bld: 94 mg/dL (ref 70–99)
Potassium: 4.6 mEq/L (ref 3.5–5.3)
SODIUM: 138 meq/L (ref 135–145)

## 2013-06-20 LAB — INSULIN, FASTING: Insulin fasting, serum: 14 u[IU]/mL (ref 3–28)

## 2013-06-21 ENCOUNTER — Other Ambulatory Visit: Payer: Self-pay | Admitting: Emergency Medicine

## 2013-06-21 MED ORDER — PRAVASTATIN SODIUM 40 MG PO TABS: 40.0000 mg | ORAL_TABLET | Freq: Every evening | ORAL | Status: DC

## 2013-06-22 ENCOUNTER — Encounter: Payer: Self-pay | Admitting: Emergency Medicine

## 2013-08-06 ENCOUNTER — Encounter: Payer: Self-pay | Admitting: Physician Assistant

## 2013-08-06 ENCOUNTER — Ambulatory Visit (INDEPENDENT_AMBULATORY_CARE_PROVIDER_SITE_OTHER): Payer: BC Managed Care – PPO | Admitting: Physician Assistant

## 2013-08-06 VITALS — BP 110/70 | HR 72 | Temp 98.1°F | Resp 16 | Wt 159.0 lb

## 2013-08-06 DIAGNOSIS — M542 Cervicalgia: Secondary | ICD-10-CM

## 2013-08-06 MED ORDER — HYDROCODONE-ACETAMINOPHEN 5-325 MG PO TABS: 1.0000 | ORAL_TABLET | Freq: Four times a day (QID) | ORAL | Status: DC | PRN

## 2013-08-06 MED ORDER — METHYLPREDNISOLONE (PAK) 4 MG PO TABS: ORAL_TABLET | ORAL | Status: DC

## 2013-08-06 MED ORDER — CYCLOBENZAPRINE HCL 10 MG PO TABS: 10.0000 mg | ORAL_TABLET | Freq: Three times a day (TID) | ORAL | Status: DC | PRN

## 2013-08-06 NOTE — Progress Notes (Signed)
   Subjective:    Patient ID: Monica [name], female    DOB: [birth year], 53 y.o.   MRN: [id number]  HPI 53 y.o. female with history of neck and back DDD with cervical fusion in 1998 with Dr. Dillard Essex presents with neck pain. She woke up Monday with neck pain and stiffness. If she turns her neck she will have sudden shooting pain in her shoulders above her shoulder blade. Her neck has improved since Monday but she continues to have shooting pains occ. She has a history of right shoulder pain with pain and numbness down her arm, she has had a negative stress test.   Cervical Xray 04/2011  IMPRESSION:  Disc space narrowing and osteophytosis at C6-C7.  Osseous encroachment upon the neural foramina secondary to uncinate  process and facet joint hypertrophy bilaterally at C6-C7 and on the  right at C5-C6.  No acute osseous abnormality.   Review of Systems  Constitutional: Negative.   HENT: Negative.   Respiratory: Negative.   Cardiovascular: Negative.   Gastrointestinal: Negative.   Genitourinary: Negative.   Musculoskeletal: Positive for myalgias, neck pain and neck stiffness. Negative for arthralgias, back pain, gait problem and joint swelling.  Skin: Negative.   Neurological: Positive for numbness (right arm).       Objective:   Physical Exam  Constitutional: She is oriented to person, place, and time. She appears well-developed and well-nourished. No distress.  HENT:  Head: Normocephalic and atraumatic.  Eyes: Conjunctivae are normal. Pupils are equal, round, and reactive to light.  Neck: Normal range of motion. Neck supple.  Cardiovascular: Normal rate and regular rhythm.   Pulmonary/Chest: Effort normal and breath sounds normal.  Musculoskeletal:  Full ROM to right but some pain with ROM to the left also decreased flexion/extension due to pain, + C4-C5 tenderness with paraspinal muscle tenderness R>L, decreased reflexes right arm, with normal sensation and pulses distal.    Lymphadenopathy:    She has no cervical adenopathy.  Neurological: She is alert and oriented to person, place, and time. No cranial nerve deficit.      Assessment & Plan:  Neck DDD with right arm radiculopathy-  Prednisone taper, Norco #30 for sleep at night, Mobic, RICE, and exercise given Will refer to orthopedics for possible infection/ PT.

## 2013-08-06 NOTE — Patient Instructions (Addendum)
We are giving you chantix for smoking cessation. You can do it! And we are here to help! You may have heard some scary side effects about chantix, the three most common I hear about are nausea, crazy dreams and depression.  However, I like for my patients to try to stay on 1/2 a tablet twice a day rather than one tablet twice a day as normally prescribed. This helps decrease the chances of side effects and helps save money by making a one month prescription last two months  Please start the prescription this way:  Start 1/2 tablet by mouth once daily after food with a full glass of water for 3 days Then do 1/2 tablet by mouth twice daily for 4 days.  At this point we have several options: 1) continue on 1/2 tablet twice a day- which I encourage you to do. You can stay on this dose the rest of the time on the medication or if you still feel the need to smoke you can do one of the two options below. 2) do one tablet in the morning and 1/2 in the evening which helps decrease dreams. 3) do one tablet twice a day.   What if I miss a dose? If you miss a dose, take it as soon as you can. If it is almost time for your next dose, take only that dose. Do not take double or extra doses.  What should I watch for while using this medicine? Visit your doctor or health care professional for regular check ups. Ask for ongoing advice and encouragement from your doctor or healthcare professional, friends, and family to help you quit. If you smoke while on this medication, quit again  Your mouth may get dry. Chewing sugarless gum or hard candy, and drinking plenty of water may help. Contact your doctor if the problem does not go away or is severe.  You may get drowsy or dizzy. Do not drive, use machinery, or do anything that needs mental alertness until you know how this medicine affects you. Do not stand or sit up quickly, especially if you are an older patient.   The use of this medicine may increase the chance  of suicidal thoughts or actions. Pay special attention to how you are responding while on this medicine. Any worsening of mood, or thoughts of suicide or dying should be reported to your health care professional right away.  ADVANTAGES OF QUITTING SMOKING  Within 20 minutes, blood pressure decreases. Your pulse is at normal level.  After 8 hours, carbon monoxide levels in the blood return to normal. Your oxygen level increases.  After 24 hours, the chance of having a heart attack starts to decrease. Your breath, hair, and body stop smelling like smoke.  After 48 hours, damaged nerve endings begin to recover. Your sense of taste and smell improve.  After 72 hours, the body is virtually free of nicotine. Your bronchial tubes relax and breathing becomes easier.  After 2 to 12 weeks, lungs can hold more air. Exercise becomes easier and circulation improves.  After 1 year, the risk of coronary heart disease is cut in half.  After 5 years, the risk of stroke falls to the same as a nonsmoker.  After 10 years, the risk of lung cancer is cut in half and the risk of other cancers decreases significantly.  After 15 years, the risk of coronary heart disease drops, usually to the level of a nonsmoker.  You will have extra money   to spend on things other than cigarettes.

## 2013-09-18 ENCOUNTER — Ambulatory Visit: Payer: Self-pay | Admitting: Physician Assistant

## 2013-09-23 ENCOUNTER — Other Ambulatory Visit: Payer: Self-pay | Admitting: Neurosurgery

## 2013-09-23 DIAGNOSIS — M5412 Radiculopathy, cervical region: Secondary | ICD-10-CM

## 2013-10-02 ENCOUNTER — Ambulatory Visit
Admission: RE | Admit: 2013-10-02 | Discharge: 2013-10-02 | Disposition: A | Discharge status: Home / Self Care | Payer: BC Managed Care – PPO | Source: Ambulatory Visit | Attending: Neurosurgery | Admitting: Neurosurgery

## 2013-10-02 VITALS — BP 104/49 | HR 56

## 2013-10-02 DIAGNOSIS — M5412 Radiculopathy, cervical region: Secondary | ICD-10-CM

## 2013-10-02 DIAGNOSIS — M5030 Other cervical disc degeneration, unspecified cervical region: Secondary | ICD-10-CM

## 2013-10-02 MED ORDER — MEPERIDINE HCL 100 MG/ML IJ SOLN
75.0000 mg | Freq: Once | INTRAMUSCULAR | Status: AC
  Administered 2013-10-02: 75 mg via INTRAMUSCULAR

## 2013-10-02 MED ORDER — ONDANSETRON HCL 4 MG/2ML IJ SOLN
4.0000 mg | Freq: Once | INTRAMUSCULAR | Status: AC
  Administered 2013-10-02: 4 mg via INTRAMUSCULAR

## 2013-10-02 MED ORDER — IOHEXOL 300 MG/ML  SOLN
10.0000 mL | Freq: Once | INTRAMUSCULAR | Status: AC | PRN
  Administered 2013-10-02: 10 mL via INTRATHECAL

## 2013-10-02 MED ORDER — DIAZEPAM 5 MG PO TABS
10.0000 mg | ORAL_TABLET | Freq: Once | ORAL | Status: AC
  Administered 2013-10-02: 10 mg via ORAL

## 2013-10-02 NOTE — Discharge Instructions (Signed)

## 2013-10-30 ENCOUNTER — Other Ambulatory Visit: Payer: Self-pay

## 2013-10-31 ENCOUNTER — Other Ambulatory Visit: Payer: Self-pay | Admitting: Physician Assistant

## 2013-11-05 ENCOUNTER — Ambulatory Visit: Payer: Self-pay | Admitting: Physician Assistant

## 2013-11-24 ENCOUNTER — Encounter: Payer: Self-pay | Admitting: Physician Assistant

## 2013-11-24 ENCOUNTER — Ambulatory Visit (INDEPENDENT_AMBULATORY_CARE_PROVIDER_SITE_OTHER): Payer: BC Managed Care – PPO | Admitting: Physician Assistant

## 2013-11-24 VITALS — BP 102/70 | HR 72 | Temp 98.1°F | Resp 16 | Ht 64.25 in | Wt 163.0 lb

## 2013-11-24 DIAGNOSIS — Z79899 Other long term (current) drug therapy: Secondary | ICD-10-CM

## 2013-11-24 DIAGNOSIS — F17200 Nicotine dependence, unspecified, uncomplicated: Secondary | ICD-10-CM

## 2013-11-24 DIAGNOSIS — M5030 Other cervical disc degeneration, unspecified cervical region: Secondary | ICD-10-CM

## 2013-11-24 DIAGNOSIS — R030 Elevated blood-pressure reading, without diagnosis of hypertension: Secondary | ICD-10-CM

## 2013-11-24 DIAGNOSIS — Z720 Tobacco use: Secondary | ICD-10-CM

## 2013-11-24 DIAGNOSIS — E785 Hyperlipidemia, unspecified: Secondary | ICD-10-CM

## 2013-11-24 DIAGNOSIS — E559 Vitamin D deficiency, unspecified: Secondary | ICD-10-CM

## 2013-11-24 LAB — CBC WITH DIFFERENTIAL/PLATELET
BASOS PCT: 0 % (ref 0–1)
Basophils Absolute: 0 10*3/uL (ref 0.0–0.1)
Eosinophils Absolute: 0.3 10*3/uL (ref 0.0–0.7)
Eosinophils Relative: 3 % (ref 0–5)
HCT: 42.1 % (ref 36.0–46.0)
Hemoglobin: 13.7 g/dL (ref 12.0–15.0)
LYMPHS PCT: 55 % — AB (ref 12–46)
Lymphs Abs: 5.9 10*3/uL — ABNORMAL HIGH (ref 0.7–4.0)
MCH: 26.8 pg (ref 26.0–34.0)
MCHC: 32.5 g/dL (ref 30.0–36.0)
MCV: 82.2 fL (ref 78.0–100.0)
Monocytes Absolute: 0.6 10*3/uL (ref 0.1–1.0)
Monocytes Relative: 6 % (ref 3–12)
NEUTROS PCT: 36 % — AB (ref 43–77)
Neutro Abs: 3.9 10*3/uL (ref 1.7–7.7)
Platelets: 250 10*3/uL (ref 150–400)
RBC: 5.12 MIL/uL — AB (ref 3.87–5.11)
RDW: 13.1 % (ref 11.5–15.5)
WBC: 10.7 10*3/uL — ABNORMAL HIGH (ref 4.0–10.5)

## 2013-11-24 LAB — BASIC METABOLIC PANEL WITH GFR
BUN: 11 mg/dL (ref 6–23)
CO2: 27 mEq/L (ref 19–32)
Calcium: 9.4 mg/dL (ref 8.4–10.5)
Chloride: 103 mEq/L (ref 96–112)
Creat: 0.59 mg/dL (ref 0.50–1.10)
GFR, Est Non African American: >89 mL/min
GLUCOSE: 92 mg/dL (ref 70–99)
POTASSIUM: 4.5 meq/L (ref 3.5–5.3)
SODIUM: 137 meq/L (ref 135–145)

## 2013-11-24 LAB — HEPATIC FUNCTION PANEL
ALT: <8 U/L (ref 0–35)
AST: 10 U/L (ref 0–37)
Albumin: 4.6 g/dL (ref 3.5–5.2)
Alkaline Phosphatase: 81 U/L (ref 39–117)
BILIRUBIN DIRECT: 0.1 mg/dL (ref 0.0–0.3)
BILIRUBIN INDIRECT: 0.2 mg/dL (ref 0.2–1.2)
Total Bilirubin: 0.3 mg/dL (ref 0.2–1.2)
Total Protein: 6.4 g/dL (ref 6.0–8.3)

## 2013-11-24 LAB — LIPID PANEL
Cholesterol: 155 mg/dL (ref 0–200)
HDL: 53 mg/dL (ref >39)
LDL CALC: 57 mg/dL (ref 0–99)
Total CHOL/HDL Ratio: 2.9 Ratio
Triglycerides: 225 mg/dL — ABNORMAL HIGH (ref <150)
VLDL: 45 mg/dL — AB (ref 0–40)

## 2013-11-24 LAB — TSH: TSH: 1.435 u[IU]/mL (ref 0.350–4.500)

## 2013-11-24 LAB — MAGNESIUM: MAGNESIUM: 2.1 mg/dL (ref 1.5–2.5)

## 2013-11-24 MED ORDER — PHENTERMINE HCL 37.5 MG PO TABS: 37.5000 mg | ORAL_TABLET | Freq: Every day | ORAL | Status: DC

## 2013-11-24 NOTE — Progress Notes (Signed)
Assessment and Plan:  Hypertension: Continue medication, monitor blood pressure at home. Continue DASH diet.  Reminder to go to the ER if any CP, SOB, nausea, dizziness, severe HA, changes vision/speech, left arm numbness and tingling, and jaw pain. Cholesterol: Continue diet and exercise. Check cholesterol.  Vitamin D Def- check level and continue medications.  Smoking cessation- discussed, continue to try chantix Weight gain- will given phentermine to take when she starts the chantix.   Continue diet and meds as discussed. Further disposition pending results of labs.  HPI 53 y.o. female  presents for 3 month follow up with hypertension, hyperlipidemia, prediabetes and vitamin D. Her blood pressure has been controlled at home, today their BP is BP: 102/70 mmHg She does workout. She denies chest pain, shortness of breath, dizziness.  She is on cholesterol medication and denies myalgias. Her cholesterol is at goal. The cholesterol last visit was:   Lab Results  Component Value Date   CHOL 192 06/19/2013   HDL 54 06/19/2013   LDLCALC 119* 06/19/2013   TRIG 94 06/19/2013   CHOLHDL 3.6 06/19/2013    Last A1C in the office was:  Lab Results  Component Value Date   HGBA1C 5.5 06/19/2013   Patient is on Vitamin D supplement.   Lab Results  Component Value Date   VD25OH 56 02/18/2013     She is a smoker and would like to quit smoking but states now is not a good time due to holidays and pain, she would like to try to quit after the holidays and when her neck pain gets better. She has a prescription of chantix and has quit for 3 years with it in the past. She is afraid of the weight gain that is associated with this and would like something to help.   She has had surgery on her neck before and states she has a bone spur causing pain, she has had an ESI with Dr. Maryjean Ka and will have another, but if they do not help she will having surgery.   Current Medications:  Current Outpatient  Prescriptions on File Prior to Visit  Medication Sig Dispense Refill  . acyclovir (ZOVIRAX) 800 MG tablet Take 800 mg by mouth as needed.    . cyclobenzaprine (FLEXERIL) 10 MG tablet Take 1/2 to 1 tablet 2 or 3 x day if needed for muscle spasms 90 tablet 0  . fish oil-omega-3 fatty acids 1000 MG capsule Take 1 g by mouth daily.    Marland Kitchen HYDROcodone-acetaminophen (NORCO) 5-325 MG per tablet Take 1 tablet by mouth every 6 (six) hours as needed for moderate pain. 60 tablet 0  . meclizine (ANTIVERT) 25 MG tablet Take 25 mg by mouth 3 (three) times daily as needed for dizziness.    . meloxicam (MOBIC) 15 MG tablet Take 15 mg by mouth daily.    . montelukast (SINGULAIR) 10 MG tablet Take 1 tablet (10 mg total) by mouth at bedtime. 90 tablet 1  . pravastatin (PRAVACHOL) 40 MG tablet Take 1 tablet (40 mg total) by mouth every evening. 30 tablet 3  . varenicline (CHANTIX CONTINUING MONTH PAK) 1 MG tablet Take 1 tablet (1 mg total) by mouth 2 (two) times daily. 180 tablet 1   No current facility-administered medications on file prior to visit.   Medical History:  Past Medical History  Diagnosis Date  . Lower back pain   . Neck pain   . Plantar fasciitis of left foot   . Plantar fasciitis of right  foot   . Hypertension   . Hyperlipidemia   . IBS (irritable bowel syndrome)   . DDD (degenerative disc disease), cervical   . DDD (degenerative disc disease), lumbar   . Unspecified vitamin D deficiency    Allergies:  Allergies  Allergen Reactions  . Aspirin Other (See Comments)    GI upset  . Mobic [Meloxicam] Other (See Comments)    GI upset  . Wellbutrin [Bupropion] Other (See Comments)    headache  . Z-Pak [Azithromycin] Other (See Comments)    thrush  . Penicillins Rash     Review of Systems: [X]  = complains of  [ ]  = denies  General: Fatigue [ ]  Fever [ ]  Chills [ ]  Weakness [ ]   Insomnia [ ]  Eyes: Redness [ ]  Blurred vision [ ]  Diplopia [ ]   ENT: Congestion [ ]  Sinus Pain [ ]  Post  Nasal Drip [ ]  Sore Throat [ ]  Earache [ ]   Cardiac: Chest pain/pressure [ ]  SOB [ ]  Orthopnea [ ]   Palpitations [ ]   Paroxysmal nocturnal dyspnea[ ]  Claudication [ ]  Edema [ ]   Pulmonary: Cough [ ]  Wheezing[ ]   SOB [ ]   Snoring [ ]   GI: Nausea [ ]  Vomiting[ ]  Dysphagia[ ]  Heartburn[ ]  Abdominal pain [ ]  Constipation [ ] ; Diarrhea [ ] ; BRBPR [ ]  Melena[ ]  GU: Hematuria[ ]  Dysuria [ ]  Nocturia[ ]  Urgency [ ]   Hesitancy [ ]  Discharge [ ]  Neuro: Headaches[ ]  Vertigo[ ]  Paresthesias[ ]  Spasm [ ]  Speech changes [ ]  Incoordination [ ]   Ortho: Arthritis [ ]  Joint pain [ ]  Muscle pain [ ]  Joint swelling [ ]  Back Pain [ ]  Skin:  Rash [ ]   Pruritis [ ]  Change in skin lesion [ ]   Psych: Depression[ ]  Anxiety[ ]  Confusion [ ]  Memory loss [ ]   Heme/Lypmh: Bleeding [ ]  Bruising [ ]  Enlarged lymph nodes [ ]   Endocrine: Visual blurring [ ]  Paresthesia [ ]  Polyuria [ ]  Polydypsea [ ]    Heat/cold intolerance [ ]  Hypoglycemia [ ]   Family history- Review and unchanged Social history- Review and unchanged Physical Exam: BP 102/70 mmHg  Pulse 72  Temp(Src) 98.1 F (36.7 C)  Resp 16  Ht 5' 4.25" (1.632 m)  Wt 163 lb (73.936 kg)  BMI 27.76 kg/m2  LMP 05/15/2010 Wt Readings from Last 3 Encounters:  11/24/13 163 lb (73.936 kg)  08/06/13 159 lb (72.122 kg)  06/19/13 163 lb (73.936 kg)   General Appearance: Well nourished, in no apparent distress. Eyes: PERRLA, EOMs, conjunctiva no swelling or erythema Sinuses: No Frontal/maxillary tenderness ENT/Mouth: Ext aud canals clear, TMs without erythema, bulging. No erythema, swelling, or exudate on post pharynx.  Tonsils not swollen or erythematous. Hearing normal.  Neck: Supple, thyroid normal.  Respiratory: Respiratory effort normal, BS equal bilaterally with mild expiratory wheeze without rales, rhonchi, or stridor.  Cardio: RRR with no MRGs. Brisk peripheral pulses without edema.  Abdomen: Soft, + BS.  Non tender, no guarding, rebound, hernias,  masses. Lymphatics: Non tender without lymphadenopathy.  Musculoskeletal: Full ROM except limited ROM of neck due to pain, 5/5 strength, normal gait.  Skin: Warm, dry without rashes, lesions, ecchymosis.  Neuro: Cranial nerves intact. Normal muscle tone, no cerebellar symptoms. Sensation intact.  Psych: Awake and oriented X 3, normal affect, Insight and Judgment appropriate.    Vicie Mutters, PA-C 4:22 PM [facility]

## 2013-11-24 NOTE — Patient Instructions (Addendum)
We are giving you chantix for smoking cessation. You can do it! And we are here to help! You may have heard some scary side effects about chantix, the three most common I hear about are nausea, crazy dreams and depression.  However, I like for my patients to try to stay on 1/2 a tablet twice a day rather than one tablet twice a day as normally prescribed. This helps decrease the chances of side effects and helps save money by making a one month prescription last two months  Please start the prescription this way:  Start 1/2 tablet by mouth once daily after food with a full glass of water for 3 days Then do 1/2 tablet by mouth twice daily for 4 days.  At this point we have several options: 1) continue on 1/2 tablet twice a day- which I encourage you to do. You can stay on this dose the rest of the time on the medication or if you still feel the need to smoke you can do one of the two options below. 2) do one tablet in the morning and 1/2 in the evening which helps decrease dreams. 3) do one tablet twice a day.   What if I miss a dose? If you miss a dose, take it as soon as you can. If it is almost time for your next dose, take only that dose. Do not take double or extra doses.  What should I watch for while using this medicine? Visit your doctor or health care professional for regular check ups. Ask for ongoing advice and encouragement from your doctor or healthcare professional, friends, and family to help you quit. If you smoke while on this medication, quit again  Your mouth may get dry. Chewing sugarless gum or hard candy, and drinking plenty of water may help. Contact your doctor if the problem does not go away or is severe.  You may get drowsy or dizzy. Do not drive, use machinery, or do anything that needs mental alertness until you know how this medicine affects you. Do not stand or sit up quickly, especially if you are an older patient.   The use of this medicine may increase the chance  of suicidal thoughts or actions. Pay special attention to how you are responding while on this medicine. Any worsening of mood, or thoughts of suicide or dying should be reported to your health care professional right away.  ADVANTAGES OF QUITTING SMOKING  Within 20 minutes, blood pressure decreases. Your pulse is at normal level.  After 8 hours, carbon monoxide levels in the blood return to normal. Your oxygen level increases.  After 24 hours, the chance of having a heart attack starts to decrease. Your breath, hair, and body stop smelling like smoke.  After 48 hours, damaged nerve endings begin to recover. Your sense of taste and smell improve.  After 72 hours, the body is virtually free of nicotine. Your bronchial tubes relax and breathing becomes easier.  After 2 to 12 weeks, lungs can hold more air. Exercise becomes easier and circulation improves.  After 1 year, the risk of coronary heart disease is cut in half.  After 5 years, the risk of stroke falls to the same as a nonsmoker.  After 10 years, the risk of lung cancer is cut in half and the risk of other cancers decreases significantly.  After 15 years, the risk of coronary heart disease drops, usually to the level of a nonsmoker.  You will have extra money   to spend on things other than cigarettes.  Phentermine  While taking the medication we may ask that you come into the office once a month or once every 2-3 months to monitor your weight, blood pressure, and heart rate. In addition we can help answer your questions about diet, exercise, and help you every step of the way with your weight loss journey. Sometime it is helpful if you bring in a food diary or use an app on your phone such as myfitnesspal to record your calorie intake, especially in the beginning.   You can start out on 1/3 to 1/2 a pill in the morning and if you are tolerating it well you can increase to one pill daily. I also have some patients that take 1/3 or  1/2 at lunch to help prevent night time eating.   What is this medicine? PHENTERMINE (FEN ter meen) decreases your appetite. This medicine is intended to be used in addition to a healthy reduced calorie diet and exercise. The best results are achieved this way. This medicine is only indicated for short-term use. Eventually your weight loss may level out and the medication will no longer be needed.   How should I use this medicine? Take this medicine by mouth. Follow the directions on the prescription label. The tablets should stay in the bottle until immediately before you take your dose. Take your doses at regular intervals. Do not take your medicine more often than directed.  Overdosage: If you think you have taken too much of this medicine contact a poison control center or emergency room at once. NOTE: This medicine is only for you. Do not share this medicine with others.  What if I miss a dose? If you miss a dose, take it as soon as you can. If it is almost time for your next dose, take only that dose. Do not take double or extra doses. Do not increase or in any way change your dose without consulting your doctor.  What should I watch for while using this medicine? Notify your physician immediately if you become short of breath while doing your normal activities. Do not take this medicine within 6 hours of bedtime. It can keep you from getting to sleep. Avoid drinks that contain caffeine and try to stick to a regular bedtime every night. Do not stand or sit up quickly, especially if you are an older patient. This reduces the risk of dizzy or fainting spells. Avoid alcoholic drinks.  What side effects may I notice from receiving this medicine? Side effects that you should report to your doctor or health care professional as soon as possible: -chest pain, palpitations -depression or severe changes in mood -increased blood pressure -irritability -nervousness or restlessness -severe  dizziness -shortness of breath -problems urinating -unusual swelling of the legs -vomiting  Side effects that usually do not require medical attention (report to your doctor or health care professional if they continue or are bothersome): -blurred vision or other eye problems -changes in sexual ability or desire -constipation or diarrhea -difficulty sleeping -dry mouth or unpleasant taste -headache -nausea This list may not describe all possible side effects. Call your doctor for medical advice about side effects. You may report side effects to FDA at 1-800-FDA-1088.  Benefiber is good for constipation/diarrhea/irritable bowel syndrome, it helps with weight loss and can help lower your bad cholesterol. Please do 1-2 TBSP in the morning in water, coffee, or tea. It can take up to a month before you can  see a difference with your bowel movements. It is cheapest from [employer], [employer], [employer].

## 2013-11-25 LAB — VITAMIN D 25 HYDROXY (VIT D DEFICIENCY, FRACTURES): Vit D, 25-Hydroxy: 30 ng/mL (ref 30–89)

## 2013-12-09 ENCOUNTER — Other Ambulatory Visit: Payer: Self-pay | Admitting: Emergency Medicine

## 2013-12-22 ENCOUNTER — Encounter: Payer: Self-pay | Admitting: Internal Medicine

## 2014-01-08 ENCOUNTER — Other Ambulatory Visit: Payer: Self-pay | Admitting: Emergency Medicine

## 2014-01-19 ENCOUNTER — Other Ambulatory Visit: Payer: Self-pay | Admitting: Internal Medicine

## 2014-01-19 MED ORDER — VARENICLINE TARTRATE 1 MG PO TABS: 1.0000 mg | ORAL_TABLET | Freq: Two times a day (BID) | ORAL | Status: DC

## 2014-02-18 ENCOUNTER — Encounter: Payer: Self-pay | Admitting: Emergency Medicine

## 2014-02-22 ENCOUNTER — Ambulatory Visit (INDEPENDENT_AMBULATORY_CARE_PROVIDER_SITE_OTHER): Payer: BLUE CROSS/BLUE SHIELD | Admitting: Physician Assistant

## 2014-02-22 ENCOUNTER — Encounter: Payer: Self-pay | Admitting: Physician Assistant

## 2014-02-22 VITALS — BP 120/78 | HR 76 | Temp 98.1°F | Resp 16 | Ht 64.25 in | Wt 165.0 lb

## 2014-02-22 DIAGNOSIS — J209 Acute bronchitis, unspecified: Secondary | ICD-10-CM

## 2014-02-22 MED ORDER — IPRATROPIUM-ALBUTEROL 0.5-2.5 (3) MG/3ML IN SOLN: 3.0000 mL | Freq: Four times a day (QID) | RESPIRATORY_TRACT | Status: DC | PRN

## 2014-02-22 MED ORDER — LEVOFLOXACIN 500 MG PO TABS: 500.0000 mg | ORAL_TABLET | Freq: Every day | ORAL | Status: AC

## 2014-02-22 MED ORDER — PROMETHAZINE-CODEINE 6.25-10 MG/5ML PO SYRP: 5.0000 mL | ORAL_SOLUTION | Freq: Four times a day (QID) | ORAL | Status: DC | PRN

## 2014-02-22 MED ORDER — PREDNISONE 20 MG PO TABS: ORAL_TABLET | ORAL | Status: DC

## 2014-02-22 MED ORDER — CLOBETASOL PROPIONATE 0.05 % EX SOLN: 1.0000 "application " | Freq: Two times a day (BID) | CUTANEOUS | Status: DC

## 2014-02-22 NOTE — Progress Notes (Signed)
Subjective:    Patient ID: Monica [name], female    DOB: [birth year], 54 y.o.   MRN: [id number]  Cough This is a new problem. Episode onset: 3 weeks. The problem has been gradually worsening. The cough is non-productive. Associated symptoms include chest pain (with coughing), ear pain, a fever (the first week, resolved), myalgias, nasal congestion, postnasal drip, rhinorrhea, a sore throat (improved), shortness of breath and wheezing. Pertinent negatives include no chills, ear congestion, headaches, heartburn, hemoptysis, rash, sweats or weight loss. Risk factors for lung disease include smoking/tobacco exposure. Treatments tried: aleve cold and sinus, dylseum. The treatment provided no relief.   Current Outpatient Prescriptions on File Prior to Visit  Medication Sig Dispense Refill  . acyclovir (ZOVIRAX) 800 MG tablet TAKE 1 TABLET DAILY 90 tablet 2  . cyclobenzaprine (FLEXERIL) 10 MG tablet Take 1/2 to 1 tablet 2 or 3 x day if needed for muscle spasms 90 tablet 0  . fish oil-omega-3 fatty acids 1000 MG capsule Take 1 g by mouth daily.    Marland Kitchen HYDROcodone-acetaminophen (NORCO) 5-325 MG per tablet Take 1 tablet by mouth every 6 (six) hours as needed for moderate pain. 60 tablet 0  . meclizine (ANTIVERT) 25 MG tablet Take 25 mg by mouth 3 (three) times daily as needed for dizziness.    . meloxicam (MOBIC) 15 MG tablet TAKE 1 TABLET DAILY 90 tablet 0  . montelukast (SINGULAIR) 10 MG tablet Take 1 tablet (10 mg total) by mouth at bedtime. 90 tablet 1  . pravastatin (PRAVACHOL) 40 MG tablet TAKE 1 TABLET EVERY EVENING 90 tablet 0  . varenicline (CHANTIX CONTINUING MONTH PAK) 1 MG tablet Take 1 tablet (1 mg total) by mouth 2 (two) times daily. 180 tablet 2   No current facility-administered medications on file prior to visit.   Past Medical History  Diagnosis Date  . Lower back pain   . Neck pain   . Plantar fasciitis of left foot   . Plantar fasciitis of right foot   . Hypertension   .  Hyperlipidemia   . IBS (irritable bowel syndrome)   . DDD (degenerative disc disease), cervical   . DDD (degenerative disc disease), lumbar   . Unspecified vitamin D deficiency     Review of Systems  Constitutional: Positive for fever (the first week, resolved) and fatigue. Negative for chills and weight loss.  HENT: Positive for congestion, ear pain, postnasal drip, rhinorrhea, sinus pressure and sore throat (improved). Negative for dental problem, ear discharge, nosebleeds, trouble swallowing and voice change.   Respiratory: Positive for cough, chest tightness, shortness of breath and wheezing. Negative for hemoptysis, choking and stridor.   Cardiovascular: Positive for chest pain (with coughing). Negative for palpitations and leg swelling.  Gastrointestinal: Negative.  Negative for heartburn.  Genitourinary: Negative.   Musculoskeletal: Positive for myalgias.  Skin: Negative for rash.  Neurological: Negative.  Negative for headaches.       Objective:   Physical Exam  Constitutional: She is oriented to person, place, and time. She appears well-developed and well-nourished.  HENT:  Head: Normocephalic and atraumatic.  Right Ear: External ear normal.  Left Ear: External ear normal.  Nose: Nose normal.  Mouth/Throat: Oropharynx is clear and moist.  Eyes: Conjunctivae are normal. Pupils are equal, round, and reactive to light.  Neck: Normal range of motion. Neck supple.  Cardiovascular: Normal rate and regular rhythm.   Pulmonary/Chest: Effort normal. No respiratory distress. She has wheezes (worse right lower lobe). She has no  rales. She exhibits no tenderness.  Abdominal: Soft. Bowel sounds are normal.  Lymphadenopathy:    She has no cervical adenopathy.  Neurological: She is alert and oriented to person, place, and time.  Skin: Skin is warm and dry.       Assessment & Plan:  1. Acute bronchitis, unspecified organism [J20.9] - get CXR if not better and/or at CPE -  levofloxacin (LEVAQUIN) 500 MG tablet; Take 1 tablet (500 mg total) by mouth daily.  Dispense: 10 tablet; Refill: 0 - predniSONE (DELTASONE) 20 MG tablet; 2 tablets daily for 3 days, 1 tablet daily for 4 days.  Dispense: 10 tablet; Refill: 0 - promethazine-codeine (PHENERGAN WITH CODEINE) 6.25-10 MG/5ML syrup; Take 5 mLs by mouth every 6 (six) hours as needed for cough.  Dispense: 240 mL; Refill: 0 - ipratropium-albuterol (DUONEB) 0.5-2.5 (3) MG/3ML SOLN; Take 3 mLs by nebulization every 6 (six) hours as needed.  Dispense: 360 mL; Refill: 0  2. Smoking cessation-  counseled patient on the dangers of tobacco use, advised patient to stop smoking, and reviewed strategies to maximize success, continue to try chantix

## 2014-02-22 NOTE — Patient Instructions (Signed)
I will give you a prescription for an antibiotic, but please only take it if you are not feeling better in 7-10 days.  Bronchitis is mostly caused by viruses and the antibiotic will do nothing.  PLEASE TRY TO DO OVER THE COUNTER TREATMENT AND/OR PREDNISONE FOR 5-7 DAYS AND IF YOU ARE NOT GETTING BETTER OR GETTING WORSE THEN YOU CAN START ON AN ANTIBIOTIC GIVEN.  Can take the prednisone AT NIGHT WITH DINNER, it take 8-12 hours to start working so it will NOT affect your sleeping if you take it at night with your food!! Take two pills the first night and 1 or two pill the second night and then 1 pill the other nights.    Rest and stay hydrated.  Make sure you drink plenty of fluids to make sure urine is clear when you urinate.  Water will help thin out mucous. - Take Mucinex DM- Maximum Strength over the counter to thin out and cough up the thick mucous.  Please follow directions on box. -Take Albuterol if prescribed.  Risk of antibiotic use: About 1 in 4 people who take antibiotics have side effects including stomach problems, dizziness, or rashes. Those problems clear up soon after stopping the drugs, but in rare cases antibiotics can cause severe allergic reaction. Over use of antibiotics also encourages the growth of bacteria that can't be controlled easily with drugs. That makes you more vunerable to antibiotic-resistant infections and undermines the benefits of antibiotics for others.   Waste of Money: Antibiotics often aren't very expensive, but any money spent on unnecessary drugs is money down the drain.   When are antibiotics needed? Only when symptoms last longer than a week.  Start to improve but then worsen again  Please call the office or message through My Chart if you have any questions.   Acute Bronchitis Bronchitis is when the airways that extend from the windpipe into the lungs get red, puffy, and painful (inflamed). Bronchitis often causes thick spit (mucus) to develop. This  leads to a cough. A cough is the most common symptom of bronchitis. In acute bronchitis, the condition usually begins suddenly and goes away over time (usually in 2 weeks). Smoking, allergies, and asthma can make bronchitis worse. Repeated episodes of bronchitis may cause more lung problems.  Most common cause of Bronchitis is viruses (rhinovirus, coronavirus, RSV).  Therefore, not requiring an antibiotic; as antibiotics only treat bacterial infections.  HOME CARE  Rest.  Drink enough fluids to keep your pee (urine) clear or pale yellow (unless you need to limit fluids as told by your doctor).  Only take over-the-counter or prescription medicines as told by your doctor.  Avoid smoking and secondhand smoke. These can make bronchitis worse. If you are a smoker, think about using nicotine gum or skin patches. Quitting smoking will help your lungs heal faster.  Reduce the chance of getting bronchitis again by:  Washing your hands often.  Avoiding people with cold symptoms.  Trying not to touch your hands to your mouth, nose, or eyes.  Follow up with your doctor as told. GET HELP IF: Your symptoms do not improve after 1 week of treatment. Symptoms include:  Cough.  Fever.  Coughing up thick spit.  Body aches.  Chest congestion.  Chills.  Shortness of breath.  Sore throat. GET HELP RIGHT AWAY IF:   You have an increased fever.  You have chills.  You have severe shortness of breath.  You have bloody thick spit (sputum).    You throw up (vomit) often.  You lose too much body fluid (dehydration).  You have a severe headache.  You faint. MAKE SURE YOU:   Understand these instructions.  Will watch your condition.  Will get help right away if you are not doing well or get worse. Document Released: 06/20/2007 Document Revised: 09/03/2012 Document Reviewed: 06/24/2012 [employer] Patient Information 2015 [employer]. This information is not intended to replace  advice given to you by your health care provider. Make sure you discuss any questions you have with your health care provider.   

## 2014-04-02 ENCOUNTER — Encounter: Payer: Self-pay | Admitting: Emergency Medicine

## 2014-04-14 ENCOUNTER — Encounter: Payer: Self-pay | Admitting: Emergency Medicine

## 2014-05-07 ENCOUNTER — Encounter: Payer: Self-pay | Admitting: Physician Assistant

## 2014-08-26 ENCOUNTER — Other Ambulatory Visit: Payer: Self-pay | Admitting: Internal Medicine

## 2014-09-02 ENCOUNTER — Ambulatory Visit (INDEPENDENT_AMBULATORY_CARE_PROVIDER_SITE_OTHER): Payer: BLUE CROSS/BLUE SHIELD

## 2014-09-02 ENCOUNTER — Encounter: Payer: Self-pay | Admitting: Podiatry

## 2014-09-02 ENCOUNTER — Ambulatory Visit (INDEPENDENT_AMBULATORY_CARE_PROVIDER_SITE_OTHER): Payer: BLUE CROSS/BLUE SHIELD | Admitting: Podiatry

## 2014-09-02 VITALS — BP 108/75 | HR 72 | Resp 16 | Ht 64.0 in | Wt 176.0 lb

## 2014-09-02 DIAGNOSIS — L84 Corns and callosities: Secondary | ICD-10-CM | POA: Diagnosis not present

## 2014-09-02 DIAGNOSIS — M779 Enthesopathy, unspecified: Secondary | ICD-10-CM

## 2014-09-02 DIAGNOSIS — M7742 Metatarsalgia, left foot: Secondary | ICD-10-CM

## 2014-09-02 DIAGNOSIS — M722 Plantar fascial fibromatosis: Secondary | ICD-10-CM

## 2014-09-02 MED ORDER — TRIAMCINOLONE ACETONIDE 10 MG/ML IJ SUSP
10.0000 mg | Freq: Once | INTRAMUSCULAR | Status: AC
Start: 2014-09-02 — End: 2014-09-02
  Administered 2014-09-02: 10 mg

## 2014-09-02 NOTE — Progress Notes (Signed)
   Subjective:    Patient ID: Monica [name], female    DOB: Aug 21, 1960, 54 y.o.   MRN: [id number]  HPI Patient presents with foot pain in their left foot, lateral side of foot. Pt stated, "side of foot gets numb". This has been going on for past 4-5 months.  Patient also presents with callous in their left foot, ball of foot. This has been going on for past 6 months.   Review of Systems  All other systems reviewed and are negative.      Objective:   Physical Exam        Assessment & Plan:

## 2014-09-02 NOTE — Patient Instructions (Signed)

## 2014-09-02 NOTE — Progress Notes (Signed)
Subjective:     Patient ID: Monica [name], female   DOB: November 07, 1960, 54 y.o.   MRN: [id number]  HPI patient presents stating I have had a lot of pain on the outside of my left foot callus underneath my left foot and a long-term history of plantar fasciitis that still bothers me if I'm active and I had surgery on it years ago. States the outside of the ankle has been hurting for around 5 months   Review of Systems  All other systems reviewed and are negative.      Objective:   Physical Exam  Constitutional: She is oriented to person, place, and time.  Cardiovascular: Intact distal pulses.   Musculoskeletal: Normal range of motion.  Neurological: She is oriented to person, place, and time.  Skin: Skin is warm.  Nursing note and vitals reviewed.  neurovascular status intact with muscle strength adequate and range of motion of the subtalar midtarsal joint within normal limits. Patient's noted to have inflammation and fluid around the left fifth metatarsal base at the insertion of peroneal tendon and has moderate discomfort in the plantar fascial when palpated deeply on the medial side. Depression of the arch is noted and there is a callus subsecond metatarsal left. Patient also has good digital perfusion is well oriented 3 with no equinus condition     Assessment:     Acute peroneal tendinitis base of fifth metatarsal left along with plantar fasciitis and callus formation which is probably causing compensatory gait    Plan:     H&P and x-rays reviewed with patient. Injected the peroneal insertion left 3 mg Kenalog 5 mg Xylocaine advised on ice and reduced activity and dispensed fascial brace. Discussed long-term orthotic treatment for plantar fascial condition and arch structure and we will make that decision at next visit

## 2014-09-06 ENCOUNTER — Other Ambulatory Visit: Payer: Self-pay | Admitting: Internal Medicine

## 2014-09-09 ENCOUNTER — Ambulatory Visit: Payer: BLUE CROSS/BLUE SHIELD | Admitting: Podiatry

## 2014-09-10 ENCOUNTER — Ambulatory Visit: Payer: BLUE CROSS/BLUE SHIELD | Admitting: Podiatry

## 2014-09-22 ENCOUNTER — Ambulatory Visit (INDEPENDENT_AMBULATORY_CARE_PROVIDER_SITE_OTHER): Payer: BLUE CROSS/BLUE SHIELD | Admitting: Podiatry

## 2014-09-22 ENCOUNTER — Encounter: Payer: Self-pay | Admitting: Podiatry

## 2014-09-22 VITALS — BP 112/65 | HR 78 | Resp 16

## 2014-09-22 DIAGNOSIS — M779 Enthesopathy, unspecified: Secondary | ICD-10-CM | POA: Diagnosis not present

## 2014-09-22 NOTE — Progress Notes (Signed)
Subjective:     Patient ID: Monica [name], female   DOB: [birth year], 54 y.o.   MRN: [id number]  HPI patient presents stating the pain in my left foot has started to improve but I do know that I have flatfeet and that's been a big part of my problem   Review of Systems     Objective:   Physical Exam Neurovascular status intact muscle strength adequate with patient noted to have moderate flatfoot deformity and continued discomfort upon deep palpation to the plantar fascial left    Assessment:     Plantar fasciitis left still present but improved    Plan:     Advised on physical therapy supportive shoe gear usage and at this time scanned for a Berkley type orthotic to provide plantar support and he'll stabilization. Reappoint when returned

## 2014-10-15 ENCOUNTER — Ambulatory Visit: Payer: BLUE CROSS/BLUE SHIELD | Admitting: *Deleted

## 2014-10-15 DIAGNOSIS — M722 Plantar fascial fibromatosis: Secondary | ICD-10-CM

## 2014-10-15 NOTE — Patient Instructions (Signed)

## 2014-10-15 NOTE — Progress Notes (Signed)
Patient ID: Monica [name], female   DOB: November 13, 1960, 54 y.o.   MRN: [id number] Patient presents for orthotic pick up.  Verbal and written break in and wear instructions given.  Patient will follow up in 4 weeks if symptoms worsen or fail to improve.

## 2014-11-17 ENCOUNTER — Encounter: Payer: Self-pay | Admitting: Internal Medicine

## 2014-11-17 ENCOUNTER — Ambulatory Visit (INDEPENDENT_AMBULATORY_CARE_PROVIDER_SITE_OTHER): Payer: BLUE CROSS/BLUE SHIELD | Admitting: Internal Medicine

## 2014-11-17 VITALS — BP 116/70 | HR 68 | Temp 97.8°F | Resp 18 | Ht 64.25 in | Wt 186.0 lb

## 2014-11-17 DIAGNOSIS — B372 Candidiasis of skin and nail: Secondary | ICD-10-CM | POA: Diagnosis not present

## 2014-11-17 MED ORDER — KETOCONAZOLE 2 % EX CREA: 1.0000 "application " | TOPICAL_CREAM | Freq: Two times a day (BID) | CUTANEOUS | Status: DC

## 2014-11-17 MED ORDER — PHENTERMINE HCL 37.5 MG PO TABS: 37.5000 mg | ORAL_TABLET | Freq: Every day | ORAL | Status: DC

## 2014-11-17 NOTE — Patient Instructions (Signed)
Cutaneous Candidiasis °Cutaneous candidiasis is a condition in which there is an overgrowth of yeast (candida) on the skin. Yeast normally live on the skin, but in small enough numbers not to cause any symptoms. In certain cases, increased growth of the yeast may cause an actual yeast infection. This kind of infection usually occurs in areas of the skin that are constantly warm and moist, such as the armpits or the groin. Yeast is the most common cause of diaper rash in babies and in people who cannot control their bowel movements (incontinence). °CAUSES  °The fungus that most often causes cutaneous candidiasis is Candida albicans. Conditions that can increase the risk of getting a yeast infection of the skin include: °· Obesity. °· Pregnancy. °· Diabetes. °· Taking antibiotic medicine. °· Taking birth control pills. °· Taking steroid medicines. °· Thyroid disease. °· An iron or zinc deficiency. °· Problems with the immune system. °SYMPTOMS  °· Red, swollen area of the skin. °· Bumps on the skin. °· Itchiness. °DIAGNOSIS  °The diagnosis of cutaneous candidiasis is usually based on its appearance. Light scrapings of the skin may also be taken and viewed under a microscope to identify the presence of yeast. °TREATMENT  °Antifungal creams may be applied to the infected skin. In severe cases, oral medicines may be needed.  °HOME CARE INSTRUCTIONS  °· Keep your skin clean and dry. °· Maintain a healthy weight. °· If you have diabetes, keep your blood sugar under control. °SEEK IMMEDIATE MEDICAL CARE IF: °· Your rash continues to spread despite treatment. °· You have a fever, chills, or abdominal pain. °  °This information is not intended to replace advice given to you by your health care provider. Make sure you discuss any questions you have with your health care provider. °  °Document Released: 09/19/2010 Document Revised: 03/26/2011 Document Reviewed: 07/05/2014 °Elsevier Interactive Patient Education ©2016 [employer] ° °

## 2014-11-17 NOTE — Progress Notes (Signed)
   Subjective:    Patient ID: Monica [name], female    DOB: 03/12/1960, 54 y.o.   MRN: [id number]  HPI  Patient presents to the office for evaluation of rash on the left side of her chest x 2 weeks.  Rash is red and round and not spreading.  She reports that she has been using her eczema medication on it.  It is very itchy.  She reports no new clothing, personal products or detergents.  She has no bug bites she is aware of.  No other similar rashes.    Review of Systems  Constitutional: Negative for fever, chills and fatigue.  Gastrointestinal: Negative for nausea and vomiting.  Skin: Positive for rash.       Objective:   Physical Exam  Constitutional: She is oriented to person, place, and time. She appears well-developed and well-nourished. No distress.  HENT:  Head: Normocephalic.  Mouth/Throat: Oropharynx is clear and moist. No oropharyngeal exudate.  Eyes: Conjunctivae are normal. No scleral icterus.  Neck: Normal range of motion. Neck supple. No JVD present. No thyromegaly present.  Cardiovascular: Normal rate, regular rhythm, normal heart sounds and intact distal pulses.  Exam reveals no gallop and no friction rub.   No murmur heard. Pulmonary/Chest: Effort normal and breath sounds normal. No respiratory distress. She has no wheezes. She has no rales. She exhibits no tenderness.  Abdominal: Soft. Bowel sounds are normal. She exhibits no distension and no mass. There is no tenderness. There is no rebound and no guarding.  Musculoskeletal: Normal range of motion.  Lymphadenopathy:    She has no cervical adenopathy.  Neurological: She is alert and oriented to person, place, and time.  Skin: Skin is warm and dry. She is not diaphoretic.     Psychiatric: She has a normal mood and affect. Her behavior is normal. Judgment and thought content normal.  Nursing note and vitals reviewed.   Filed Vitals:   11/17/14 1100  BP: 116/70  Pulse: 68  Temp: 97.8 F (36.6 C)  Resp:  18   Wt Readings from Last 3 Encounters:  11/17/14 186 lb (84.369 kg)  09/02/14 176 lb (79.833 kg)  02/22/14 165 lb (74.844 kg)            Assessment & Plan:    1. Yeast dermatitis -ketoconazole -cont use of clobetasol -if no better in 1 week patient is to call office  2.  Weight gain -phentermine -repeat OV 1 month

## 2014-12-06 ENCOUNTER — Other Ambulatory Visit: Payer: Self-pay | Admitting: *Deleted

## 2014-12-06 MED ORDER — ALPRAZOLAM 0.5 MG PO TABS: 0.5000 mg | ORAL_TABLET | Freq: Three times a day (TID) | ORAL | Status: DC | PRN

## 2014-12-16 ENCOUNTER — Ambulatory Visit: Payer: Self-pay | Admitting: Internal Medicine

## 2015-03-29 ENCOUNTER — Other Ambulatory Visit: Payer: Self-pay | Admitting: Internal Medicine

## 2015-05-17 DIAGNOSIS — Z6836 Body mass index (BMI) 36.0-36.9, adult: Secondary | ICD-10-CM | POA: Diagnosis not present

## 2015-05-17 DIAGNOSIS — M4722 Other spondylosis with radiculopathy, cervical region: Secondary | ICD-10-CM | POA: Diagnosis not present

## 2015-06-07 ENCOUNTER — Other Ambulatory Visit: Payer: Self-pay | Admitting: Internal Medicine

## 2015-06-08 DIAGNOSIS — Z1389 Encounter for screening for other disorder: Secondary | ICD-10-CM | POA: Diagnosis not present

## 2015-06-08 DIAGNOSIS — K600 Acute anal fissure: Secondary | ICD-10-CM | POA: Diagnosis not present

## 2015-06-08 DIAGNOSIS — Z130 Encounter for screening for diseases of the blood and blood-forming organs and certain disorders involving the immune mechanism: Secondary | ICD-10-CM | POA: Diagnosis not present

## 2015-06-08 DIAGNOSIS — Z6831 Body mass index (BMI) 31.0-31.9, adult: Secondary | ICD-10-CM | POA: Diagnosis not present

## 2015-06-08 DIAGNOSIS — Z01419 Encounter for gynecological examination (general) (routine) without abnormal findings: Secondary | ICD-10-CM | POA: Diagnosis not present

## 2015-06-08 DIAGNOSIS — Z1231 Encounter for screening mammogram for malignant neoplasm of breast: Secondary | ICD-10-CM | POA: Diagnosis not present

## 2015-06-15 ENCOUNTER — Ambulatory Visit (INDEPENDENT_AMBULATORY_CARE_PROVIDER_SITE_OTHER): Payer: BLUE CROSS/BLUE SHIELD | Admitting: Internal Medicine

## 2015-06-15 ENCOUNTER — Encounter: Payer: Self-pay | Admitting: Internal Medicine

## 2015-06-15 VITALS — BP 110/62 | HR 70 | Temp 98.2°F | Resp 16 | Ht 64.0 in | Wt 189.0 lb

## 2015-06-15 DIAGNOSIS — F4323 Adjustment disorder with mixed anxiety and depressed mood: Secondary | ICD-10-CM | POA: Diagnosis not present

## 2015-06-15 DIAGNOSIS — E785 Hyperlipidemia, unspecified: Secondary | ICD-10-CM

## 2015-06-15 DIAGNOSIS — E559 Vitamin D deficiency, unspecified: Secondary | ICD-10-CM

## 2015-06-15 MED ORDER — ALPRAZOLAM 0.5 MG PO TABS: 0.5000 mg | ORAL_TABLET | Freq: Three times a day (TID) | ORAL | Status: DC | PRN

## 2015-06-15 NOTE — Progress Notes (Signed)
Assessment and Plan:  Hypertension:  -Continue medication,  -monitor blood pressure at home.  -Continue DASH diet.   -Reminder to go to the ER if any CP, SOB, nausea, dizziness, severe HA, changes vision/speech, left arm numbness and tingling, and jaw pain.  Cholesterol: -Continue diet and exercise.    Pre-diabetes: -Continue diet and exercise.   Vitamin D Def: -continue medications.   Anxiety -xanax refilled  Cannot afford labs currently.  Will defer to next visit.  Husband who was primary bread winner is currently out of job.  Continue diet and meds as discussed. Further disposition pending results of labs.  HPI 55 y.o. female  presents for 3 month follow up with hypertension, hyperlipidemia, prediabetes and vitamin D.   Her blood pressure has been controlled at home, today their BP is BP: 110/62 mmHg.   She does not workout. She denies chest pain, shortness of breath, dizziness.   She is not on cholesterol medication and denies myalgias. Her cholesterol is at goal. The cholesterol last visit was:   Lab Results  Component Value Date   CHOL 155 11/24/2013   HDL 53 11/24/2013   LDLCALC 57 11/24/2013   TRIG 225* 11/24/2013   CHOLHDL 2.9 11/24/2013     She has been working on diet and exercise for prediabetes, and denies foot ulcerations, hyperglycemia, hypoglycemia , increased appetite, nausea, paresthesia of the feet, polydipsia, polyuria, visual disturbances, vomiting and weight loss. Last A1C in the office was:  Lab Results  Component Value Date   HGBA1C 5.5 06/19/2013    Patient is on Vitamin D supplement.  Lab Results  Component Value Date   VD25OH 30 11/24/2013     She notes that she has been having some anxiety as she has been taking care of her husband who has been in and out of the psych hospital.  She does take xanax which helps.  She does not take 3 tablets per day.  She only had a few days where that was a necessity.     Current Medications:  Current  Outpatient Prescriptions on File Prior to Visit  Medication Sig Dispense Refill  . acyclovir (ZOVIRAX) 800 MG tablet TAKE 1 TABLET DAILY 90 tablet 2  . ALPRAZolam (XANAX) 0.5 MG tablet TAKE 1 TABLET BY MOUTH 3 TIMES A DAY AS NEEDED FOR ANXIETY 90 tablet 0  . clobetasol (TEMOVATE) 0.05 % external solution Apply 1 application topically 2 (two) times daily. 50 mL 3  . HYDROcodone-acetaminophen (NORCO) 5-325 MG per tablet Take 1 tablet by mouth every 6 (six) hours as needed for moderate pain. 60 tablet 0  . ipratropium-albuterol (DUONEB) 0.5-2.5 (3) MG/3ML SOLN Take 3 mLs by nebulization every 6 (six) hours as needed. 360 mL 0  . ketoconazole (NIZORAL) 2 % cream Apply 1 application topically 2 (two) times daily. 30 g 0  . meclizine (ANTIVERT) 25 MG tablet Take 25 mg by mouth 3 (three) times daily as needed for dizziness.     No current facility-administered medications on file prior to visit.    Medical History:  Past Medical History  Diagnosis Date  . Lower back pain   . Neck pain   . Plantar fasciitis of left foot   . Plantar fasciitis of right foot   . Hypertension   . Hyperlipidemia   . IBS (irritable bowel syndrome)   . DDD (degenerative disc disease), cervical   . DDD (degenerative disc disease), lumbar   . Unspecified vitamin D deficiency  Allergies:  Allergies  Allergen Reactions  . Aspirin Other (See Comments)    GI upset  . Mobic [Meloxicam] Other (See Comments)    GI upset  . Wellbutrin [Bupropion] Other (See Comments)    headache  . Z-Pak [Azithromycin] Other (See Comments)    thrush  . Penicillins Rash     Review of Systems:  Review of Systems  Constitutional: Negative for fever, chills and malaise/fatigue.  HENT: Negative for congestion, ear pain and sore throat.   Eyes: Negative.   Respiratory: Negative for cough, shortness of breath and wheezing.   Cardiovascular: Negative for chest pain, palpitations and leg swelling.  Gastrointestinal: Negative for  heartburn, abdominal pain, diarrhea, constipation, blood in stool and melena.  Genitourinary: Negative.   Skin: Negative.   Neurological: Negative for dizziness, sensory change, loss of consciousness and headaches.  Psychiatric/Behavioral: Negative for depression. The patient is nervous/anxious. The patient does not have insomnia.     Family history- Review and unchanged  Social history- Review and unchanged  Physical Exam: BP 110/62 mmHg  Pulse 70  Temp(Src) 98.2 F (36.8 C) (Temporal)  Resp 16  Ht 5\' 4"  (1.626 m)  Wt 189 lb (85.73 kg)  BMI 32.43 kg/m2  LMP 05/15/2010 Wt Readings from Last 3 Encounters:  06/15/15 189 lb (85.73 kg)  11/17/14 186 lb (84.369 kg)  09/02/14 176 lb (79.833 kg)    General Appearance: Well nourished well developed, in no apparent distress. Eyes: PERRLA, EOMs, conjunctiva no swelling or erythema ENT/Mouth: Ear canals normal without obstruction, swelling, erythma, discharge.  TMs normal bilaterally.  Oropharynx moist, clear, without exudate, or postoropharyngeal swelling. Neck: Supple, thyroid normal,no cervical adenopathy  Respiratory: Respiratory effort normal, Breath sounds clear A&P without rhonchi, wheeze, or rale.  No retractions, no accessory usage. Cardio: RRR with no MRGs. Brisk peripheral pulses without edema.  Abdomen: Soft, + BS,  Non tender, no guarding, rebound, hernias, masses. Musculoskeletal: Full ROM, 5/5 strength, Normal gait Skin: Warm, dry without rashes, lesions, ecchymosis.  Neuro: Awake and oriented X 3, Cranial nerves intact. Normal muscle tone, no cerebellar symptoms. Psych: Normal affect, Insight and Judgment appropriate.    Starlyn Skeans, PA-C 4:21 PM [facility]

## 2015-08-24 DIAGNOSIS — M4722 Other spondylosis with radiculopathy, cervical region: Secondary | ICD-10-CM | POA: Diagnosis not present

## 2015-08-24 DIAGNOSIS — M5412 Radiculopathy, cervical region: Secondary | ICD-10-CM | POA: Diagnosis not present

## 2015-10-07 ENCOUNTER — Other Ambulatory Visit: Payer: Self-pay | Admitting: Internal Medicine

## 2015-10-26 ENCOUNTER — Ambulatory Visit (INDEPENDENT_AMBULATORY_CARE_PROVIDER_SITE_OTHER): Payer: BLUE CROSS/BLUE SHIELD | Admitting: Physician Assistant

## 2015-10-26 ENCOUNTER — Encounter: Payer: Self-pay | Admitting: Physician Assistant

## 2015-10-26 VITALS — BP 128/72 | HR 74 | Temp 97.3°F | Resp 16 | Ht 64.0 in | Wt 189.6 lb

## 2015-10-26 DIAGNOSIS — Z79899 Other long term (current) drug therapy: Secondary | ICD-10-CM

## 2015-10-26 DIAGNOSIS — K602 Anal fissure, unspecified: Secondary | ICD-10-CM | POA: Diagnosis not present

## 2015-10-26 DIAGNOSIS — F4323 Adjustment disorder with mixed anxiety and depressed mood: Secondary | ICD-10-CM

## 2015-10-26 DIAGNOSIS — R030 Elevated blood-pressure reading, without diagnosis of hypertension: Secondary | ICD-10-CM | POA: Diagnosis not present

## 2015-10-26 DIAGNOSIS — R040 Epistaxis: Secondary | ICD-10-CM | POA: Diagnosis not present

## 2015-10-26 DIAGNOSIS — E785 Hyperlipidemia, unspecified: Secondary | ICD-10-CM | POA: Diagnosis not present

## 2015-10-26 LAB — TSH: TSH: 2.01 m[IU]/L

## 2015-10-26 LAB — HEPATIC FUNCTION PANEL
ALK PHOS: 79 U/L (ref 33–130)
ALT: 4 U/L — AB (ref 6–29)
AST: 12 U/L (ref 10–35)
Albumin: 4 g/dL (ref 3.6–5.1)
BILIRUBIN TOTAL: 0.3 mg/dL (ref 0.2–1.2)
Bilirubin, Direct: 0 mg/dL (ref <=0.2)
Indirect Bilirubin: 0.3 mg/dL (ref 0.2–1.2)
Total Protein: 6.4 g/dL (ref 6.1–8.1)

## 2015-10-26 LAB — CBC WITH DIFFERENTIAL/PLATELET
BASOS PCT: 0 %
Basophils Absolute: 0 cells/uL (ref 0–200)
EOS ABS: 182 {cells}/uL (ref 15–500)
Eosinophils Relative: 2 %
HCT: 38.1 % (ref 35.0–45.0)
HEMOGLOBIN: 12.2 g/dL (ref 11.7–15.5)
Lymphocytes Relative: 43 %
Lymphs Abs: 3913 cells/uL — ABNORMAL HIGH (ref 850–3900)
MCH: 26.1 pg — AB (ref 27.0–33.0)
MCHC: 32 g/dL (ref 32.0–36.0)
MCV: 81.4 fL (ref 80.0–100.0)
MONOS PCT: 7 %
MPV: 9.8 fL (ref 7.5–12.5)
Monocytes Absolute: 637 cells/uL (ref 200–950)
Neutro Abs: 4368 cells/uL (ref 1500–7800)
Neutrophils Relative %: 48 %
PLATELETS: 241 10*3/uL (ref 140–400)
RBC: 4.68 MIL/uL (ref 3.80–5.10)
RDW: 13.3 % (ref 11.0–15.0)
WBC: 9.1 10*3/uL (ref 3.8–10.8)

## 2015-10-26 LAB — BASIC METABOLIC PANEL WITH GFR
BUN: 10 mg/dL (ref 7–25)
CHLORIDE: 106 mmol/L (ref 98–110)
CO2: 24 mmol/L (ref 20–31)
CREATININE: 0.6 mg/dL (ref 0.50–1.05)
Calcium: 9.1 mg/dL (ref 8.6–10.4)
GFR, Est African American: >89 mL/min (ref >=60)
GFR, Est Non African American: >89 mL/min (ref >=60)
Glucose, Bld: 84 mg/dL (ref 65–99)
POTASSIUM: 4.3 mmol/L (ref 3.5–5.3)
Sodium: 140 mmol/L (ref 135–146)

## 2015-10-26 LAB — LIPID PANEL
CHOL/HDL RATIO: 3.3 ratio (ref <=5.0)
Cholesterol: 158 mg/dL (ref 125–200)
HDL: 48 mg/dL (ref >=46)
LDL CALC: 74 mg/dL (ref <130)
Triglycerides: 179 mg/dL — ABNORMAL HIGH (ref <150)
VLDL: 36 mg/dL — ABNORMAL HIGH (ref <30)

## 2015-10-26 MED ORDER — DILTIAZEM GEL 2 %: 1.0000 "application " | Freq: Two times a day (BID) | CUTANEOUS | 3 refills | Status: DC

## 2015-10-26 MED ORDER — LIDOCAINE 5 % EX OINT: 1.0000 "application " | TOPICAL_OINTMENT | Freq: Three times a day (TID) | CUTANEOUS | 1 refills | Status: DC | PRN

## 2015-10-26 MED ORDER — PHENTERMINE HCL 37.5 MG PO TABS: 37.5000 mg | ORAL_TABLET | Freq: Every day | ORAL | 2 refills | Status: DC

## 2015-10-26 NOTE — Patient Instructions (Addendum)
Anal Fissure, Adult An anal fissure is a small tear or crack in the skin around the anus. Bleeding from a fissure usually stops on its own within a few minutes. However, bleeding will often occur again with each bowel movement until the crack heals. CAUSES This condition may be caused by:  Passing large, hard stool (feces).  Frequent diarrhea.  Constipation.  Inflammatory bowel disease (Crohn disease or ulcerative colitis).  Infections.  Anal sex. SYMPTOMS Symptoms of this condition include:  Bleeding from the rectum.  Small amounts of blood seen on your stool, on toilet paper, or in the toilet after a bowel movement.  Painful bowel movements.  Itching or irritation around the anus. DIAGNOSIS A health care provider may diagnose this condition by closely examining the anal area. An anal fissure can usually be seen with careful inspection. In some cases, a rectal exam may be performed, or a short tube (anoscope) may be used to examine the anal canal. TREATMENT Treatment for this condition may include:  Taking steps to avoid constipation. This may include making changes to your diet, such as increasing your intake of fiber or fluid.  Taking fiber supplements. These supplements can soften your stool to help make bowel movements easier. Your health care provider may also prescribe a stool softener if your stool is often hard.  Taking sitz baths. This may help to heal the tear.  Using medicated creams or ointments. These may be prescribed to lessen discomfort. HOME CARE INSTRUCTIONS Eating and Drinking  Avoid foods that may be constipating, such as bananas and dairy products.  Drink enough fluid to keep your urine clear or pale yellow.  Maintain a diet that is high in fruits, whole grains, and vegetables. General Instructions  Keep the anal area as clean and dry as possible.  Take sitz baths as told by your health care provider. Do not use soap in the sitz baths.  Take  over-the-counter and prescription medicines only as told by your health care provider.  Use creams or ointments only as told by your health care provider.  Keep all follow-up visits as told by your health care provider. This is important. SEEK MEDICAL CARE IF:  You have more bleeding.  You have a fever.  You have diarrhea that is mixed with blood.  You continue to have pain.  Your problem is getting worse rather than better.   This information is not intended to replace advice given to you by your health care provider. Make sure you discuss any questions you have with your health care provider.   Document Released: 01/01/2005 Document Revised: 09/22/2014 Document Reviewed: 03/29/2014 Elsevier Interactive Patient Education 2016 [place] is good for constipation/diarrhea/irritable bowel syndrome, it helps with weight loss and can help lower your bad cholesterol. Please do 1 TBSP in the morning in water, coffee, or tea. It can take up to a month before you can see a difference with your bowel movements. It is cheapest from [employer], [employer], [employer].   Start 2% diltiazem gel 3 x daily x 2 months Start recticare/lidocaine 5% 3 -4 x daily  Moistened tissue Warm tub soaks We discussed anal fissures and long healing process- she will try the above regimen over the next 6-8 weeks  Will refer to GI for possible colonoscopy to look for other pathology  Orka steamer from [place]   Phentermine  While taking the medication we may ask that you come into the office once a month or once every  2-3 months to monitor your weight, blood pressure, and heart rate. In addition we can help answer your questions about diet, exercise, and help you every step of the way with your weight loss journey. Sometime it is helpful if you bring in a food diary or use an app on your phone such as myfitnesspal to record your calorie intake, especially in the beginning.   You can start out on 1/3 to  1/2 a pill in the morning and if you are tolerating it well you can increase to one pill daily. I also have some patients that take 1/3 or 1/2 at lunch to help prevent night time eating.  This medication is cheapest CASH pay at [facility] is 14-17 dollars and you do NOT need a membership to get meds from there.    What is this medicine? PHENTERMINE (FEN ter meen) decreases your appetite. This medicine is intended to be used in addition to a healthy reduced calorie diet and exercise. The best results are achieved this way. This medicine is only indicated for short-term use. Eventually your weight loss may level out and the medication will no longer be needed.   How should I use this medicine? Take this medicine by mouth. Follow the directions on the prescription label. The tablets should stay in the bottle until immediately before you take your dose. Take your doses at regular intervals. Do not take your medicine more often than directed.  Overdosage: If you think you have taken too much of this medicine contact a poison control center or emergency room at once. NOTE: This medicine is only for you. Do not share this medicine with others.  What if I miss a dose? If you miss a dose, take it as soon as you can. If it is almost time for your next dose, take only that dose. Do not take double or extra doses. Do not increase or in any way change your dose without consulting your doctor.  What should I watch for while using this medicine? Notify your physician immediately if you become short of breath while doing your normal activities. Do not take this medicine within 6 hours of bedtime. It can keep you from getting to sleep. Avoid drinks that contain caffeine and try to stick to a regular bedtime every night. Do not stand or sit up quickly, especially if you are an older patient. This reduces the risk of dizzy or fainting spells. Avoid alcoholic drinks.  What side effects may I  notice from receiving this medicine? Side effects that you should report to your doctor or health care professional as soon as possible: -chest pain, palpitations -depression or severe changes in mood -increased blood pressure -irritability -nervousness or restlessness -severe dizziness -shortness of breath -problems urinating -unusual swelling of the legs -vomiting  Side effects that usually do not require medical attention (report to your doctor or health care professional if they continue or are bothersome): -blurred vision or other eye problems -changes in sexual ability or desire -constipation or diarrhea -difficulty sleeping -dry mouth or unpleasant taste -headache -nausea This list may not describe all possible side effects. Call your doctor for medical advice about side effects. You may report side effects to FDA at 1-800-FDA-1088.   Nosebleed Nosebleeds are common. A nosebleed can be caused by many things, including:  Getting hit hard in the nose.  Infections.  Dryness in your nose.  A dry climate.  Medicines.  Picking your nose.  Your  home heating and cooling systems. HOME CARE   Try controlling your nosebleed by pinching your nostrils gently. Do this for at least 10 minutes.  Avoid blowing or sniffing your nose for a number of hours after having a nosebleed.  Do not put gauze inside of your nose yourself. If your nose was packed by your doctor, try to keep the pack inside of your nose until your doctor removes it.  If a gauze pack was used and it starts to fall out, gently replace it or cut off the end of it.  If a balloon catheter was used to pack your nose, do not cut or remove it unless told by your doctor.  Avoid lying down while you are having a nosebleed. Sit up and lean forward.  Use a nasal spray decongestant to help with a nosebleed as told by your doctor.  Do not use petroleum jelly or mineral oil in your nose. These can drip into your  lungs.  Keep your house humid by using:  Less air conditioning.  A humidifier.  Aspirin and blood thinners make bleeding more likely. If you are prescribed these medicines and you have nosebleeds, ask your doctor if you should stop taking the medicines or adjust the dose. Do not stop medicines unless told by your doctor.  Resume your normal activities as you are able. Avoid straining, lifting, or bending at your waist for several days.  If your nosebleed was caused by dryness in your nose, use over-the-counter saline nasal spray or gel. If you must use a lubricant:  Choose one that is water-soluble.  Use it only as needed.  Do not use it within several hours of lying down.  Keep all follow-up visits as told by your doctor. This is important. GET HELP IF:  You have a fever.  You get frequent nosebleeds.  You are getting nosebleeds more often. GET HELP RIGHT AWAY IF:  Your nosebleed lasts longer than 20 minutes.  Your nosebleed occurs after an injury to your face, and your nose looks crooked or broken.  You have unusual bleeding from other parts of your body.  You have unusual bruising on other parts of your body.  You feel light-headed or dizzy.  You become sweaty.  You throw up (vomit) blood.  You have a nosebleed after a head injury.   This information is not intended to replace advice given to you by your health care provider. Make sure you discuss any questions you have with your health care provider.   Document Released: 10/11/2007 Document Revised: 01/22/2014 Document Reviewed: 08/17/2013 Elsevier Interactive Patient Education [employer].

## 2015-10-26 NOTE — Progress Notes (Signed)
Assessment and Plan:  Anal fissure -     CBC with Differential/Platelet --     diltiazem 2 % GEL; Apply 1 application topically 2 (two) times daily. -     lidocaine (XYLOCAINE) 5 % ointment; Apply 1 application topically 3 (three) times daily as needed.  Hyperlipidemia, unspecified hyperlipidemia type -     Lipid panel - -     phentermine (ADIPEX-P) 37.5 MG tablet; Take 1 tablet (37.5 mg total) by mouth daily before breakfast.  Adjustment disorder with mixed anxiety and depressed mood  Elevated blood pressure reading without diagnosis of hypertension - continue medications, DASH diet, exercise and monitor at home. Call if greater than 130/80.  -     CBC with Differential/Platelet -     BASIC METABOLIC PANEL WITH GFR -     Hepatic function panel -     TSH  Epistaxis Try saline and vaseline If not better can try cautry -     CBC with Differential/Platelet   Continue diet and meds as discussed. Further disposition pending results of labs. Over 30 minutes of exam, counseling, chart review, and critical decision making was performed  No future appointments.   HPI 55 y.o. female  presents for 3 month follow up on hypertension, cholesterol, prediabetes, and vitamin D deficiency.   Her blood pressure has been controlled at home, today their BP is BP: 128/72   She does not workout. She denies chest pain, shortness of breath, dizziness. She has an anal fissure and is being treated by GYN, taking a long time to heal. .  She has had 5 episodes of nose bleeds x 3weeks, 2 were with blowing her nose, and 3 were with just sitting there, always the left side, no ASA, no NSAIDS, no ETOH Has blood in stool from fissure on TP, no family history of bleeding issues, no other signs of bleeding issues. No new meds.   She is not on cholesterol medication and denies myalgias. Her cholesterol is at goal. The cholesterol last visit was:   Lab Results  Component Value Date   CHOL 155 11/24/2013   HDL  53 11/24/2013   LDLCALC 57 11/24/2013   TRIG 225 (H) 11/24/2013   CHOLHDL 2.9 11/24/2013   Last A1C in the office was:  Lab Results  Component Value Date   HGBA1C 5.5 06/19/2013   Patient is on Vitamin D supplement.   Lab Results  Component Value Date   VD25OH 30 11/24/2013     BMI is Body mass index is 32.54 kg/m., she is working on diet and exercise. She has been under a lot of stress recently with her husband and does not smoke anymore so she states that she eats for the stress, she has had 2 jobs, husband just found job.  Wt Readings from Last 3 Encounters:  10/26/15 189 lb 9.6 oz (86 kg)  06/15/15 189 lb (85.7 kg)  11/17/14 186 lb (84.4 kg)    Current Medications:  Current Outpatient Prescriptions on File Prior to Visit  Medication Sig Dispense Refill  . acyclovir (ZOVIRAX) 800 MG tablet TAKE 1 TABLET DAILY 90 tablet 2  . ALPRAZolam (XANAX) 0.5 MG tablet Take 1 tablet (0.5 mg total) by mouth 3 (three) times daily as needed for anxiety. 90 tablet 1  . clobetasol (TEMOVATE) 0.05 % external solution Apply 1 application topically 2 (two) times daily. 50 mL 3  . HYDROcodone-acetaminophen (NORCO) 5-325 MG per tablet Take 1 tablet by mouth every 6 (  six) hours as needed for moderate pain. 60 tablet 0  . meclizine (ANTIVERT) 25 MG tablet Take 25 mg by mouth 3 (three) times daily as needed for dizziness.     No current facility-administered medications on file prior to visit.    Medical History:  Past Medical History:  Diagnosis Date  . DDD (degenerative disc disease), cervical   . DDD (degenerative disc disease), lumbar   . Hyperlipidemia   . Hypertension   . IBS (irritable bowel syndrome)   . Lower back pain   . Neck pain   . Plantar fasciitis of left foot   . Plantar fasciitis of right foot   . Unspecified vitamin D deficiency    Allergies:  Allergies  Allergen Reactions  . Aspirin Other (See Comments)    GI upset  . Mobic [Meloxicam] Other (See Comments)    GI  upset  . Wellbutrin [Bupropion] Other (See Comments)    headache  . Z-Pak [Azithromycin] Other (See Comments)    thrush  . Penicillins Rash     Review of Systems:  ROS  Family history- Review and unchanged Social history- Review and unchanged Physical Exam: BP 128/72   Pulse 74   Temp 97.3 F (36.3 C)   Resp 16   Ht 5\' 4"  (1.626 m)   Wt 189 lb 9.6 oz (86 kg)   LMP 05/15/2010   SpO2 97%   BMI 32.54 kg/m  Wt Readings from Last 3 Encounters:  10/26/15 189 lb 9.6 oz (86 kg)  06/15/15 189 lb (85.7 kg)  11/17/14 186 lb (84.4 kg)   General Appearance: Well nourished, in no apparent distress. Eyes: PERRLA, EOMs, conjunctiva no swelling or erythema Sinuses: No Frontal/maxillary tenderness ENT/Mouth: Ext aud canals clear, TMs without erythema, bulging. No erythema, swelling, or exudate on post pharynx.  Tonsils not swollen or erythematous. Hearing normal.  Neck: Supple, thyroid normal.  Respiratory: Respiratory effort normal, BS equal bilaterally without rales, rhonchi, wheezing or stridor.  Cardio: RRR with no MRGs. Brisk peripheral pulses without edema.  Abdomen: Soft, + BS,  Non tender, no guarding, rebound, hernias, masses. Lymphatics: Non tender without lymphadenopathy.  Musculoskeletal: Full ROM, 5/5 strength, Normal gait Skin: Warm, dry without rashes, lesions, ecchymosis.  Neuro: Cranial nerves intact. Normal muscle tone, no cerebellar symptoms. Psych: Awake and oriented X 3, normal affect, Insight and Judgment appropriate.    Vicie Mutters, PA-C 4:35 PM [facility]

## 2015-11-18 DIAGNOSIS — M5412 Radiculopathy, cervical region: Secondary | ICD-10-CM | POA: Diagnosis not present

## 2015-11-18 DIAGNOSIS — Z6835 Body mass index (BMI) 35.0-35.9, adult: Secondary | ICD-10-CM | POA: Diagnosis not present

## 2015-11-18 DIAGNOSIS — M4722 Other spondylosis with radiculopathy, cervical region: Secondary | ICD-10-CM | POA: Diagnosis not present

## 2016-01-19 ENCOUNTER — Encounter: Payer: Self-pay | Admitting: Physician Assistant

## 2016-01-19 ENCOUNTER — Ambulatory Visit (INDEPENDENT_AMBULATORY_CARE_PROVIDER_SITE_OTHER): Payer: BLUE CROSS/BLUE SHIELD | Admitting: Physician Assistant

## 2016-01-19 VITALS — BP 116/70 | HR 97 | Temp 97.7°F | Resp 16 | Ht 64.0 in | Wt 178.6 lb

## 2016-01-19 DIAGNOSIS — E6609 Other obesity due to excess calories: Secondary | ICD-10-CM | POA: Diagnosis not present

## 2016-01-19 DIAGNOSIS — K602 Anal fissure, unspecified: Secondary | ICD-10-CM | POA: Diagnosis not present

## 2016-01-19 DIAGNOSIS — J339 Nasal polyp, unspecified: Secondary | ICD-10-CM

## 2016-01-19 DIAGNOSIS — Z6835 Body mass index (BMI) 35.0-35.9, adult: Secondary | ICD-10-CM | POA: Diagnosis not present

## 2016-01-19 MED ORDER — LIDOCAINE 5 % EX OINT: 1.0000 "application " | TOPICAL_OINTMENT | Freq: Three times a day (TID) | CUTANEOUS | 1 refills | Status: DC | PRN

## 2016-01-19 MED ORDER — PHENTERMINE HCL 37.5 MG PO TABS: 37.5000 mg | ORAL_TABLET | Freq: Every day | ORAL | 2 refills | Status: DC

## 2016-01-19 NOTE — Patient Instructions (Addendum)
Do steroid nasal spray Saline nasal spray  Can do a steroid nasal spary 1-2 sparys at night each nostril. Remember to spray each nostril twice towards the outer part of your eye.  Do not sniff but instead pinch your nose and tilt your head back to help the medicine get into your sinuses.  The best time to do this is at bedtime. Stop if you get blurred vision or nose bleeds.     Nasal Polyps Nasal polyps are growths that form in the nose. Irritation and swelling (inflammation) in the nose or sinus openings can lead to changes in the tissue (mucosa) that lines these areas. Long-term inflammation causes the mucosa to balloon out or grow into a polyp. The polyp fills with watery mucus. Nasal polyps look like moist, gray grapes in the nose.Nasal polyps are not cancer. They do not increase your risk of cancer. You may have one nasal polyp or more than one. They can be small or large. In most cases, they form in both sides of the nose. Polyps can make it hard to breathe through your nose (nasal obstruction). What are the causes? The exact cause of nasal polyps is not known. What increases the risk? You are more likely to develop nasal polyps if you:  Have a family history of the condition.  Have a disease that causes inflammation in your nose or sinuses.  Have another condition that affects your nose or sinuses, such as:  Nasal allergies (allergic rhinitis).  Long-term nasal obstruction (nonallergic rhinitis).  Asthma.  Nasal or sinus infection, especially fungal infection.  Are female.  Are older than 57 years of age.  Have a sensitivity to aspirin or alcohol.  Smoke.  Have a disease passed down through families that causes increased production of thick mucus (cystic fibrosis). What are the signs or symptoms? Symptoms depend on the size of the polyps. Small polyps may cause few symptoms. When symptoms develop, they may include:  Nasal obstruction.  Decreased senses of smell and  taste.  Runny nose.  The feeling of mucus going down the back of the throat (postnasal drip).  Headache, face pain, or sinus pressure.  Snoring.  Frequent nasal or sinus infections.  Itchy eyes. How is this diagnosed?   Nasal polyps may be diagnosed based on your symptoms, your medical history, and a physical exam of the inside of your nose. You may also have tests, such as:  Imaging studies such as a CT scan or MRI to see how large your polyps are and if any are in your sinuses.  Skin or blood tests to find out if your polyps may be caused by allergies.  Washings or swabs taken from your nose to test for inflammation or infection. How is this treated? Small nasal polyps that are not causing symptoms may not need treatment. For large polyps that are causing symptoms, the goal of treatment is to reduce nasal obstruction and improve sinus drainage. Treatment may include:  A medicine to reduce inflammation (steroid). This is usually the first treatment. You may have to take steroids for a short or long period of time. Short-term steroids are usually taken as pills. Long-term steroid treatment is usually in the form of nose drops or spray.  Medicines to treat an underlying condition, such as allergies, asthma, or infection.  Surgery. This may be needed to remove nasal polyps if medicine does not help. Follow these instructions at home:  Take or use over-the-counter and prescription medicines only as told  by your health care provider. Do not stop using your medicine even if you start to feel better.  Use solutions to wash or rinse out the inside of your nose (nasal washes or irrigations) as told by your health care provider.  Do not take medicines that contain aspirin if they make your symptoms worse.  Do not drink alcohol if it makes your symptoms worse.  Do not use any tobacco products, such as cigarettes, chewing tobacco, and e-cigarettes. If you need help quitting, ask your  health care provider.  Keep all follow-up visits as told by your health care provider. This is important. Contact a health care provider if:  Your condition does not get better or it gets worse at home after treatment.  You have a fever.  You have headaches or pain in your face that is new or is getting worse.  You have a bloody nose. This information is not intended to replace advice given to you by your health care provider. Make sure you discuss any questions you have with your health care provider. Document Released: 04/25/2015 Document Revised: 06/09/2015 Document Reviewed: 03/24/2015 Elsevier Interactive Patient Education  2017 [place], Adult An anal fissure is a small tear or crack in the skin around the anus. Bleeding from a fissure usually stops on its own within a few minutes. However, bleeding will often occur again with each bowel movement until the crack heals. CAUSES This condition may be caused by:  Passing large, hard stool (feces).  Frequent diarrhea.  Constipation.  Inflammatory bowel disease (Crohn disease or ulcerative colitis).  Infections.  Anal sex. SYMPTOMS Symptoms of this condition include:  Bleeding from the rectum.  Small amounts of blood seen on your stool, on toilet paper, or in the toilet after a bowel movement.  Painful bowel movements.  Itching or irritation around the anus. DIAGNOSIS A health care provider may diagnose this condition by closely examining the anal area. An anal fissure can usually be seen with careful inspection. In some cases, a rectal exam may be performed, or a short tube (anoscope) may be used to examine the anal canal. TREATMENT Treatment for this condition may include:  Taking steps to avoid constipation. This may include making changes to your diet, such as increasing your intake of fiber or fluid.  Taking fiber supplements. These supplements can soften your stool to help make bowel  movements easier. Your health care provider may also prescribe a stool softener if your stool is often hard.  Taking sitz baths. This may help to heal the tear.  Using medicated creams or ointments. These may be prescribed to lessen discomfort. HOME CARE INSTRUCTIONS Eating and Drinking  Avoid foods that may be constipating, such as bananas and dairy products.  Drink enough fluid to keep your urine clear or pale yellow.  Maintain a diet that is high in fruits, whole grains, and vegetables. General Instructions  Keep the anal area as clean and dry as possible.  Take sitz baths as told by your health care provider. Do not use soap in the sitz baths.  Take over-the-counter and prescription medicines only as told by your health care provider.  Use creams or ointments only as told by your health care provider.  Keep all follow-up visits as told by your health care provider. This is important. SEEK MEDICAL CARE IF:  You have more bleeding.  You have a fever.  You have diarrhea that is mixed with blood.  You  continue to have pain.  Your problem is getting worse rather than better.   This information is not intended to replace advice given to you by your health care provider. Make sure you discuss any questions you have with your health care provider.   Document Released: 01/01/2005 Document Revised: 09/22/2014 Document Reviewed: 03/29/2014 Elsevier Interactive Patient Education [employer].

## 2016-01-19 NOTE — Progress Notes (Signed)
Assessment and Plan:   Anal fissure -     Ambulatory referral to Gastroenterology -     lidocaine (XYLOCAINE) 5 % ointment; Apply 1 application topically 3 (three) times daily as needed. Has been seeing GYN for anal fissure, given lidocaine and diltiazem without help Refer gastro  Class 2 obesity due to excess calories without serious comorbidity with body mass index (BMI) of 35.0 to 35.9 in adult -     phentermine (ADIPEX-P) 37.5 MG tablet; Take 1 tablet (37.5 mg total) by mouth daily before breakfast. - long discussion about weight loss, diet, and exercise  Nasal polyp -     Ambulatory referral to ENT - nasal steroids   Continue diet and meds as discussed. Further disposition pending results of labs. Over 30 minutes of exam, counseling, chart review, and critical decision making was performed  Future Appointments Date Time Provider [place]  05/03/2016 4:30 PM Vicie Mutters, PA-C [id number] None     HPI 56 y.o. female  presents for 3 month follow up on hypertension, cholesterol, prediabetes, and vitamin D deficiency and obesity, she was given phentermine last OV.   Her blood pressure has been controlled at home, today their BP is BP: 116/70  Has had anal fissure x may being treated by GYN, and was given meds last visit, will refer to GI since not better.   She does not workout. She denies chest pain, shortness of breath, dizziness.  She is not on cholesterol medication and denies myalgias. Her cholesterol is at goal. The cholesterol last visit was:   Lab Results  Component Value Date   CHOL 158 10/26/2015   HDL 48 10/26/2015   LDLCALC 74 10/26/2015   TRIG 179 (H) 10/26/2015   CHOLHDL 3.3 10/26/2015   Last A1C in the office was:  Lab Results  Component Value Date   HGBA1C 5.5 06/19/2013   Patient is on Vitamin D supplement.   Lab Results  Component Value Date   VD25OH 30 11/24/2013     BMI is Body mass index is 30.66 kg/m., she is working on diet and  exercise, and she is down 10 lbs from oct with that and phentermine.  Wt Readings from Last 3 Encounters:  01/19/16 178 lb 9.6 oz (81 kg)  10/26/15 189 lb 9.6 oz (86 kg)  06/15/15 189 lb (85.7 kg)    Current Medications:  Current Outpatient Prescriptions on File Prior to Visit  Medication Sig Dispense Refill  . acyclovir (ZOVIRAX) 800 MG tablet TAKE 1 TABLET DAILY 90 tablet 2  . ALPRAZolam (XANAX) 0.5 MG tablet Take 1 tablet (0.5 mg total) by mouth 3 (three) times daily as needed for anxiety. 90 tablet 1  . clobetasol (TEMOVATE) 0.05 % external solution Apply 1 application topically 2 (two) times daily. 50 mL 3  . diltiazem 2 % GEL Apply 1 application topically 2 (two) times daily. 30 g 3  . HYDROcodone-acetaminophen (NORCO) 5-325 MG per tablet Take 1 tablet by mouth every 6 (six) hours as needed for moderate pain. 60 tablet 0  . lidocaine (XYLOCAINE) 5 % ointment Apply 1 application topically 3 (three) times daily as needed. 50 g 1  . meclizine (ANTIVERT) 25 MG tablet Take 25 mg by mouth 3 (three) times daily as needed for dizziness.    . phentermine (ADIPEX-P) 37.5 MG tablet Take 1 tablet (37.5 mg total) by mouth daily before breakfast. 30 tablet 2   No current facility-administered medications on file prior to visit.  Medical History:  Past Medical History:  Diagnosis Date  . DDD (degenerative disc disease), cervical   . DDD (degenerative disc disease), lumbar   . Hyperlipidemia   . Hypertension   . IBS (irritable bowel syndrome)   . Lower back pain   . Neck pain   . Plantar fasciitis of left foot   . Plantar fasciitis of right foot   . Unspecified vitamin D deficiency    Allergies:  Allergies  Allergen Reactions  . Aspirin Other (See Comments)    GI upset  . Mobic [Meloxicam] Other (See Comments)    GI upset  . Wellbutrin [Bupropion] Other (See Comments)    headache  . Z-Pak [Azithromycin] Other (See Comments)    thrush  . Penicillins Rash     Review of Systems:   Review of Systems  Constitutional: Negative for chills, fever and malaise/fatigue.  HENT: Negative for congestion, ear pain and sore throat.   Eyes: Negative.   Respiratory: Negative for cough, shortness of breath and wheezing.   Cardiovascular: Negative for chest pain, palpitations and leg swelling.  Gastrointestinal: Negative for abdominal pain, blood in stool, constipation, diarrhea, heartburn and melena.  Genitourinary: Negative.   Skin: Negative for itching and rash.  Neurological: Negative for dizziness, sensory change, loss of consciousness and headaches.  Psychiatric/Behavioral: Negative for depression. The patient is nervous/anxious. The patient does not have insomnia.     Family history- Review and unchanged Social history- Review and unchanged Physical Exam: BP 116/70   Pulse 97   Temp 97.7 F (36.5 C)   Resp 16   Ht 5\' 4"  (1.626 m)   Wt 178 lb 9.6 oz (81 kg)   LMP 05/15/2010   SpO2 97%   BMI 30.66 kg/m  Wt Readings from Last 3 Encounters:  01/19/16 178 lb 9.6 oz (81 kg)  10/26/15 189 lb 9.6 oz (86 kg)  06/15/15 189 lb (85.7 kg)   General Appearance: Well nourished, in no apparent distress. Eyes: PERRLA, EOMs, conjunctiva no swelling or erythema Sinuses: left nasal cavity with possible polyp, nontender, 4x5 mm. No Frontal/maxillary tenderness ENT/Mouth: Ext aud canals clear, TMs without erythema, bulging. No erythema, swelling, or exudate on post pharynx.  Tonsils not swollen or erythematous. Hearing normal.  Neck: Supple, thyroid normal.  Respiratory: Respiratory effort normal, BS equal bilaterally without rales, rhonchi, wheezing or stridor.  Cardio: RRR with no MRGs. Brisk peripheral pulses without edema.  Abdomen: Soft, + BS,  Non tender, no guarding, rebound, hernias, masses. Lymphatics: Non tender without lymphadenopathy.  Musculoskeletal: Full ROM, 5/5 strength, Normal gait Skin: Warm, dry without rashes, lesions, ecchymosis.  Neuro: Cranial nerves  intact. Normal muscle tone, no cerebellar symptoms. Psych: Awake and oriented X 3, normal affect, Insight and Judgment appropriate.    Vicie Mutters, PA-C 5:19 PM [facility]

## 2016-01-24 ENCOUNTER — Encounter: Payer: Self-pay | Admitting: Physician Assistant

## 2016-01-30 DIAGNOSIS — R040 Epistaxis: Secondary | ICD-10-CM | POA: Diagnosis not present

## 2016-02-01 ENCOUNTER — Ambulatory Visit: Payer: Self-pay | Admitting: Physician Assistant

## 2016-02-08 ENCOUNTER — Encounter: Payer: Self-pay | Admitting: Physician Assistant

## 2016-02-08 ENCOUNTER — Ambulatory Visit (INDEPENDENT_AMBULATORY_CARE_PROVIDER_SITE_OTHER): Payer: BLUE CROSS/BLUE SHIELD | Admitting: Physician Assistant

## 2016-02-08 VITALS — BP 120/70 | HR 76 | Ht 64.0 in | Wt 176.0 lb

## 2016-02-08 DIAGNOSIS — Z1212 Encounter for screening for malignant neoplasm of rectum: Secondary | ICD-10-CM | POA: Diagnosis not present

## 2016-02-08 DIAGNOSIS — Z1211 Encounter for screening for malignant neoplasm of colon: Secondary | ICD-10-CM

## 2016-02-08 DIAGNOSIS — K602 Anal fissure, unspecified: Secondary | ICD-10-CM | POA: Diagnosis not present

## 2016-02-08 MED ORDER — NA SULFATE-K SULFATE-MG SULF 17.5-3.13-1.6 GM/177ML PO SOLN: 1.0000 | ORAL | 0 refills | Status: DC

## 2016-02-08 MED ORDER — LIDOCAINE 5 % EX OINT: 1.0000 "application " | TOPICAL_OINTMENT | Freq: Three times a day (TID) | CUTANEOUS | 1 refills | Status: DC | PRN

## 2016-02-08 MED ORDER — AMBULATORY NON FORMULARY MEDICATION: 0.1250 mg | Freq: Three times a day (TID) | 3 refills | Status: DC

## 2016-02-08 NOTE — Progress Notes (Signed)
Chief Complaint: Anal Fissure  HPI:  Monica [name] is a 56 year old Caucasian female with a past medical history of degenerative disc disease, hyperlipidemia, hypertension and IBS, who previously followed with Dr. Velora Heckler and was referred to me by Vicie Mutters, PA-C for a complaint of anal fissure .     Per review of records patient's last colonoscopy was completed by Dr. Velora Heckler on 06/12/01 with a finding of hemorrhoids internal and external and an otherwise normal. Patient was to return in 10 years for screening.    Today, the patient presents to clinic and tells me that in May of last year she was diagnosed with a anal fissure during time of her gynecology appointment. At that time she was given suppositories which she took for 2 rounds with no help. She then went to see her primary care provider and was placed on diltiazem gel and lidocaine. She has been using diltiazem twice a day and lidocaine whenever she feels itching over the past 4 months or so. She tells me that it will seems to go on a cycle of "getting raw and then feeling better and then itching and then she will scratch it and will get raw again". The patient describes that at first she did see some bright red blood, but now she only sees blood if she itches in this area. It is towards the "front of my rectum". Patient also takes Sitz baths at least twice a day as these "feels so good". Patient tells me that this all started after a week of diarrhea which was "nonstop". This is somewhat normal for her , the patient will have diarrhea frequently if she "eats the wrong thing". This has also been getting better over the past few months because she has changed her diet to more healthy eating "baked meats and vegetables". Overall the patient feels much better than she did before but does continue with some itching in this area and occasionally this area will get more irritated if she has an episode of diarrhea.   Patient denies fever, chills,  weight loss, fatigue, anorexia, nausea, vomiting, heartburn, reflux or abdominal pain  Past Medical History:  Diagnosis Date  . DDD (degenerative disc disease), cervical   . DDD (degenerative disc disease), lumbar   . Hyperlipidemia   . Hypertension   . IBS (irritable bowel syndrome)   . Lower back pain   . Neck pain   . Plantar fasciitis of left foot   . Plantar fasciitis of right foot   . Unspecified vitamin D deficiency     Past Surgical History:  Procedure Laterality Date  . CESAREAN SECTION    . lower back     L4 and L5   . NECK SURGERY     metal plate and pins in neck  . PLANTAR FASCIA SURGERY     both feet    Current Outpatient Prescriptions  Medication Sig Dispense Refill  . acyclovir (ZOVIRAX) 800 MG tablet TAKE 1 TABLET DAILY 90 tablet 2  . ALPRAZolam (XANAX) 0.5 MG tablet Take 1 tablet (0.5 mg total) by mouth 3 (three) times daily as needed for anxiety. 90 tablet 1  . clobetasol (TEMOVATE) 0.05 % external solution Apply 1 application topically 2 (two) times daily. 50 mL 3  . diltiazem 2 % GEL Apply 1 application topically 2 (two) times daily. 30 g 3  . HYDROcodone-acetaminophen (NORCO) 5-325 MG per tablet Take 1 tablet by mouth every 6 (six) hours as needed for moderate pain.  60 tablet 0  . lidocaine (XYLOCAINE) 5 % ointment Apply 1 application topically 3 (three) times daily as needed. 50 g 1  . meclizine (ANTIVERT) 25 MG tablet Take 25 mg by mouth 3 (three) times daily as needed for dizziness.    . phentermine (ADIPEX-P) 37.5 MG tablet Take 1 tablet (37.5 mg total) by mouth daily before breakfast. 30 tablet 2   No current facility-administered medications for this visit.     Allergies as of 02/08/2016 - Review Complete 02/08/2016  Allergen Reaction Noted  . Aspirin Other (See Comments) 01/06/2013  . Mobic [meloxicam] Other (See Comments) 01/06/2013  . Wellbutrin [bupropion] Other (See Comments) 01/06/2013  . Z-pak [azithromycin] Other (See Comments)  01/06/2013  . Penicillins Rash 01/06/2013    Family History  Problem Relation Age of Onset  . Heart disease Father   . Diabetes Father   . Hyperlipidemia Father   . Hypertension Father   . Cervical cancer Sister   . Hyperlipidemia Mother   . Hypertension Mother   . Stroke Maternal Aunt   . Cancer Maternal Aunt     breast x 2 different aunts  . Heart disease Maternal Grandmother   . Heart disease Maternal Grandfather     Social History   Social History  . Marital status: Married    Spouse name: N/A  . Number of children: N/A  . Years of education: N/A   Occupational History  . Not on file.   Social History Main Topics  . Smoking status: Former Smoker    Quit date: 09/16/2014  . Smokeless tobacco: Not on file  . Alcohol use No  . Drug use: No  . Sexual activity: Not on file   Other Topics Concern  . Not on file   Social History Narrative  . No narrative on file    Review of Systems:     Constitutional: No weight loss, fever, chills, weakness or fatigue HEENT: Eyes: No change in vision               Ears, Nose, Throat:  Positive for nose bleeds Skin: No rash  Cardiovascular: No chest pain Respiratory: No SOB or cough Gastrointestinal: See HPI and otherwise negative Genitourinary: No dysuria or change in urinary frequency Neurological: No headache, dizziness or syncope Musculoskeletal: No new muscle or joint pain Hematologic: No bruising   Physical Exam:  Vital signs: BP 120/70   Pulse 76   Ht 5\' 4"  (1.626 m)   Wt 176 lb (79.8 kg)   LMP 05/15/2010   BMI 30.21 kg/m   Constitutional:   Pleasant Caucasian female appears to be in NAD, Well developed, Well nourished, alert and cooperative Head:  Normocephalic and atraumatic. Eyes:   PEERL, EOMI. No icterus. Conjunctiva pink. Ears:  Normal auditory acuity. Neck:  Supple Throat: Oral cavity and pharynx without inflammation, swelling or lesion.  Respiratory: Respirations even and unlabored. Lungs clear to  auscultation bilaterally.   No wheezes, crackles, or rhonchi.  Cardiovascular: Normal S1, S2. No MRG. Regular rate and rhythm. No peripheral edema, cyanosis or pallor.  Gastrointestinal:  Soft, nondistended, nontender. No rebound or guarding. Normal bowel sounds. No appreciable masses or hepatomegaly. Rectal: External exam: Healing anal fissure or towards the interior of rectum, patient is tender to palpation in this area, no visible tear at this time, no hemorrhoids; internal exam: Mild TTP towards anterior rectum, no hemorrhoids, no discharge Msk:  Symmetrical without gross deformities. Without edema, no deformity or joint abnormality.  Neurologic:  Alert and  oriented x4;  grossly normal neurologically.  Skin:   Dry and intact without significant lesions or rashes. Psychiatric:  Demonstrates good judgement and reason without abnormal affect or behaviors.  MOST RECENT LABS: CBC    Component Value Date/Time   WBC 9.1 10/26/2015 1707   RBC 4.68 10/26/2015 1707   HGB 12.2 10/26/2015 1707   HCT 38.1 10/26/2015 1707   PLT 241 10/26/2015 1707   MCV 81.4 10/26/2015 1707   MCH 26.1 (L) 10/26/2015 1707   MCHC 32.0 10/26/2015 1707   RDW 13.3 10/26/2015 1707   LYMPHSABS 3,913 (H) 10/26/2015 1707   MONOABS 637 10/26/2015 1707   EOSABS 182 10/26/2015 1707   BASOSABS 0 10/26/2015 1707    CMP     Component Value Date/Time   NA 140 10/26/2015 1707   K 4.3 10/26/2015 1707   CL 106 10/26/2015 1707   CO2 24 10/26/2015 1707   GLUCOSE 84 10/26/2015 1707   BUN 10 10/26/2015 1707   CREATININE 0.60 10/26/2015 1707   CALCIUM 9.1 10/26/2015 1707   PROT 6.4 10/26/2015 1707   ALBUMIN 4.0 10/26/2015 1707   AST 12 10/26/2015 1707   ALT 4 (L) 10/26/2015 1707   ALKPHOS 79 10/26/2015 1707   BILITOT 0.3 10/26/2015 1707   GFRNONAA >89 10/26/2015 1707   GFRAA >89 10/26/2015 1707    Assessment: 1.Anal fissure: Patient has been treated since May of last year, on exam today it does appear to be healing,  patient is likely feeling itching due to this healing process, encouraged her to stop itching this area and to continue with treatment as below 2. Screening colonoscopy: Patient's last colonoscopy was in 2003, completely normal, patient is overdue for screening colonoscopy at this time  Plan: 1. Changed patient to nitroglycerin ointment applied 3 times daily to area of anal fissure with a gloved fingertip 2. Refilled patient's Lidocaine ointment 3. Encouraged her to continue sitz baths at least twice a day 4. Encouraged patient to continue healthy diet and decrease amount of diarrhea/variance in stools 5. Scheduled patient for a screening colonoscopy at the end of March, did talk with her that if she feels like her anal fissure is not well healed at that time, we can delay this as it is just for screening. This was scheduled with Dr. Havery Moros in [facility] as he is a supervising physician this morning. Discussed risks, benefits, limitations alternatives and the patient agrees to proceed 6. Patient to follow in clinic per Dr. Doyne Keel recommendations after time of procedure or sooner if necessary  Ellouise Newer, PA-C [facility] 02/08/2016, 8:43 AM  Cc: Vicie Mutters, PA-C

## 2016-02-08 NOTE — Patient Instructions (Signed)
We have sent a prescription for nitroglycerin 0.125% gel to [facility]. You should apply a pea size amount to your rectum three times daily x 6-8 weeks.  [facility]'s information is below: Address: [address], [place], [place] [address]  [phone]  We have sent the following medications to your pharmacy for you to pick up at your convenience: Lidocaine ointment refill  You have been scheduled for a colonoscopy. Please follow written instructions given to you at your visit today.  Please pick up your prep supplies at the pharmacy within the next 1-3 days. If you use inhalers (even only as needed), please bring them with you on the day of your procedure. Your physician has requested that you go to www.startemmi.com and enter the access code given to you at your visit today. This web site gives a general overview about your procedure. However, you should still follow specific instructions given to you by our office regarding your preparation for the procedure.

## 2016-02-08 NOTE — Progress Notes (Signed)
Agree with assessment and plan as outlined.  

## 2016-02-16 DIAGNOSIS — M5412 Radiculopathy, cervical region: Secondary | ICD-10-CM | POA: Diagnosis not present

## 2016-02-16 DIAGNOSIS — Z6834 Body mass index (BMI) 34.0-34.9, adult: Secondary | ICD-10-CM | POA: Diagnosis not present

## 2016-02-16 DIAGNOSIS — M4722 Other spondylosis with radiculopathy, cervical region: Secondary | ICD-10-CM | POA: Diagnosis not present

## 2016-02-16 DIAGNOSIS — R030 Elevated blood-pressure reading, without diagnosis of hypertension: Secondary | ICD-10-CM | POA: Diagnosis not present

## 2016-03-15 ENCOUNTER — Telehealth: Payer: Self-pay | Admitting: Physician Assistant

## 2016-03-15 NOTE — Telephone Encounter (Signed)
Pt was advised that the itching is a part of the healing process as told by Monica [name] at her last office visit.  She will call back if she does not continue to get better or if she worsens.  She will continue nitroglycerin and lidocaine, as well as sitz baths.

## 2016-03-19 ENCOUNTER — Other Ambulatory Visit: Payer: Self-pay | Admitting: Otolaryngology

## 2016-03-19 DIAGNOSIS — L980 Pyogenic granuloma: Secondary | ICD-10-CM | POA: Diagnosis not present

## 2016-03-19 DIAGNOSIS — R040 Epistaxis: Secondary | ICD-10-CM | POA: Diagnosis not present

## 2016-03-28 ENCOUNTER — Telehealth: Payer: Self-pay | Admitting: Physician Assistant

## 2016-03-28 NOTE — Telephone Encounter (Signed)
No answer will call later  

## 2016-03-28 NOTE — Telephone Encounter (Signed)
The pt has continued pain and red "raw" area at her rectum.  She was treated with diltiazem gel and nitroglycerin for anal fissure.  Some days it feels better but the itching is intense.  She states it is very painful and raw.  The past 3 days the pain is worse.  She is also doing sitz baths.  She states even when she urinates it burns.  No available appts until 04/04/16 and she says she cant deal with it that long.  Please advise

## 2016-03-28 NOTE — Telephone Encounter (Signed)
She could try Recta-Care with Lidocaine prn over the area. Please have her let us know if it gets any worse, she would need to be seen, could possibly be something else at this point.  Thanks-JLL

## 2016-03-29 NOTE — Telephone Encounter (Signed)
The pt has been given the recommendations and also will follow up next week with Anderson Malta.  Appt has been made.  She will call back and cancel if better.

## 2016-04-04 ENCOUNTER — Ambulatory Visit (INDEPENDENT_AMBULATORY_CARE_PROVIDER_SITE_OTHER): Payer: BLUE CROSS/BLUE SHIELD | Admitting: Physician Assistant

## 2016-04-04 ENCOUNTER — Encounter: Payer: Self-pay | Admitting: Physician Assistant

## 2016-04-04 VITALS — BP 120/70 | HR 72 | Ht 64.0 in | Wt 178.0 lb

## 2016-04-04 DIAGNOSIS — K602 Anal fissure, unspecified: Secondary | ICD-10-CM

## 2016-04-04 DIAGNOSIS — Z1211 Encounter for screening for malignant neoplasm of colon: Secondary | ICD-10-CM

## 2016-04-04 DIAGNOSIS — L309 Dermatitis, unspecified: Secondary | ICD-10-CM

## 2016-04-04 MED ORDER — HYDROCORTISONE 2.5 % RE CREA: 1.0000 "application " | TOPICAL_CREAM | Freq: Two times a day (BID) | RECTAL | 1 refills | Status: DC

## 2016-04-04 NOTE — Progress Notes (Signed)
Chief Complaint: Persistent Anal Fissure  HPI:  Monica [name] is a 56 year old Caucasian female who was last seen in clinic on 02/08/16 by myself for an anal fissure, she returns today for continued rectal pain and suspicion of anal fissure.   At time of last visit patient described that in May 2017 she been diagnosed with a fissure during time of a gynecology appointment. She was given suppositories at that time and then placed on diltiazem gel and lidocaine. She had used the diltiazem gel twice a day and lidocaine whenever she felt itching over the past 4 months. At that time the patient had a healing anal fissure towards the interior of the rectum, tender to palpation, she was changed to nitroglycerin ointment 3 times a day. She was scheduled for a screening colonoscopy as she had not had one since 2003.   Today, the patient tells me that she was doing her Nitroglycerin ointment 3 times a day for the past couple of months as well as sitz baths multiple times a day and this seemed to be helping her symptoms until a week ago when she had a recurrence of "terrible rectal pain". She stopped using her nitroglycerin ointment for fear that this was causing her symptoms and started rectacare and lidocaine which did relieve her symptoms temporarily. Over the past few days this pain has gotten some better and the patient has restarted her nitroglycerin ointment yesterday. She does continue with itching surrounding her rectum. She continues to tell me that she varies between diarrhea and constipation but lately she has been having somewhat "regular solid" stools on a daily basis. She did cancel her recent colonoscopy for fear that this would worsen her fissure symptoms.   Patient denies fever, chills, blood in her stool, melena, weight loss, anorexia, abdominal pain or symptoms that awaken her at night.  Past Medical History:  Diagnosis Date  . Anal fissure   . DDD (degenerative disc disease), cervical   .  DDD (degenerative disc disease), lumbar   . Hyperlipidemia   . Hypertension   . IBS (irritable bowel syndrome)   . Lower back pain   . Neck pain   . Plantar fasciitis of left foot   . Plantar fasciitis of right foot   . Unspecified vitamin D deficiency     Past Surgical History:  Procedure Laterality Date  . CESAREAN SECTION    . lower back     L4 and L5   . NECK SURGERY     metal plate and pins in neck  . PLANTAR FASCIA SURGERY     both feet    Current Outpatient Prescriptions  Medication Sig Dispense Refill  . acyclovir (ZOVIRAX) 800 MG tablet Take 800 mg by mouth daily as needed.    . ALPRAZolam (XANAX) 0.5 MG tablet Take 1 tablet (0.5 mg total) by mouth 3 (three) times daily as needed for anxiety. 90 tablet 1  . AMBULATORY NON FORMULARY MEDICATION Place 0.125 mg rectally 3 (three) times daily. Medication Name: Nitroglycerin 1 Tube 3  . clobetasol ointment (TEMOVATE) 9.76 % Apply 1 application topically 2 (two) times daily as needed.    . diltiazem 2 % GEL Apply 1 application topically 2 (two) times daily. 30 g 3  . HYDROcodone-acetaminophen (NORCO) 5-325 MG per tablet Take 1 tablet by mouth every 6 (six) hours as needed for moderate pain. 60 tablet 0  . lidocaine (XYLOCAINE) 5 % ointment Apply 1 application topically 3 (three) times daily as needed.  50 g 1  . meclizine (ANTIVERT) 25 MG tablet Take 25 mg by mouth 3 (three) times daily as needed for dizziness.    . phentermine (ADIPEX-P) 37.5 MG tablet Take 1 tablet (37.5 mg total) by mouth daily before breakfast. 30 tablet 2  . hydrocortisone (ANUSOL-HC) 2.5 % rectal cream Place 1 application rectally 2 (two) times daily. 30 g 1  . Na Sulfate-K Sulfate-Mg Sulf (SUPREP BOWEL PREP KIT) 17.5-3.13-1.6 GM/180ML SOLN Take 1 kit by mouth as directed. (Patient not taking: Reported on 04/04/2016) 324 mL 0   No current facility-administered medications for this visit.     Allergies as of 04/04/2016 - Review Complete 04/04/2016    Allergen Reaction Noted  . Aspirin Other (See Comments) 01/06/2013  . Mobic [meloxicam] Other (See Comments) 01/06/2013  . Wellbutrin [bupropion] Other (See Comments) 01/06/2013  . Z-pak [azithromycin] Other (See Comments) 01/06/2013  . Penicillins Rash 01/06/2013    Family History  Problem Relation Age of Onset  . Heart disease Father   . Diabetes Father   . Hyperlipidemia Father   . Hypertension Father   . Cervical cancer Sister   . Hyperlipidemia Mother   . Hypertension Mother   . Stroke Maternal Aunt   . Cancer Maternal Aunt     breast x 2 different aunts  . Heart disease Maternal Grandmother   . Heart disease Maternal Grandfather     Social History   Social History  . Marital status: Married    Spouse name: N/A  . Number of children: 1  . Years of education: N/A   Occupational History  . Web designer    Social History Main Topics  . Smoking status: Former Smoker    Years: 22.00    Types: Cigarettes    Quit date: 09/16/2014  . Smokeless tobacco: Never Used  . Alcohol use No  . Drug use: No  . Sexual activity: Not on file   Other Topics Concern  . Not on file   Social History Narrative  . No narrative on file    Review of Systems:    Constitutional: No weight loss, fever, chills, weakness or fatigue Cardiovascular: No chest pain Respiratory: No SOB  Gastrointestinal: See HPI and otherwise negative   Physical Exam:  Vital signs: BP 120/70   Pulse 72   Ht 5' 4" (1.626 m)   Wt 178 lb (80.7 kg)   LMP 05/15/2010   BMI 30.55 kg/m   Constitutional:   Pleasant Caucasian female appears to be in NAD, Well developed, Well nourished, alert and cooperative Respiratory: Respirations even and unlabored. Lungs clear to auscultation bilaterally.   No wheezes, crackles, or rhonchi.  Cardiovascular: Normal S1, S2. No MRG. Regular rate and rhythm. No peripheral edema, cyanosis or pallor.  Gastrointestinal:  Soft, nondistended, nontender. No rebound or  guarding. Normal bowel sounds. No appreciable masses or hepatomegaly. Rectal:  By myself and Dr. Havery Moros: External exam: Dermatitis and excoriations, thickening of the skin, visible fissure posteriorly in the rectum; internal exam: Deferred due to fissure Psychiatric:Demonstrates good judgement and reason without abnormal affect or behaviors.  RELEVANT LABS AND IMAGING: CBC    Component Value Date/Time   WBC 9.1 10/26/2015 1707   RBC 4.68 10/26/2015 1707   HGB 12.2 10/26/2015 1707   HCT 38.1 10/26/2015 1707   PLT 241 10/26/2015 1707   MCV 81.4 10/26/2015 1707   MCH 26.1 (L) 10/26/2015 1707   MCHC 32.0 10/26/2015 1707   RDW 13.3 10/26/2015 1707  LYMPHSABS 3,913 (H) 10/26/2015 1707   MONOABS 637 10/26/2015 1707   EOSABS 182 10/26/2015 1707   BASOSABS 0 10/26/2015 1707    CMP     Component Value Date/Time   NA 140 10/26/2015 1707   K 4.3 10/26/2015 1707   CL 106 10/26/2015 1707   CO2 24 10/26/2015 1707   GLUCOSE 84 10/26/2015 1707   BUN 10 10/26/2015 1707   CREATININE 0.60 10/26/2015 1707   CALCIUM 9.1 10/26/2015 1707   PROT 6.4 10/26/2015 1707   ALBUMIN 4.0 10/26/2015 1707   AST 12 10/26/2015 1707   ALT 4 (L) 10/26/2015 1707   ALKPHOS 79 10/26/2015 1707   BILITOT 0.3 10/26/2015 1707   GFRNONAA >89 10/26/2015 1707   GFRAA >89 10/26/2015 1707    Assessment: 1. Anal Fissure:Persisting, patient has been treated since last May for this fissure, some improvement recently until about a week ago when patient had increase in pain again, does report variation in bowel habits between diarrhea and constipation, though any bowel movement hurts at this point, has been using nitroglycerin ointment 3 times daily and doing sitz baths 2. Anal Dermatitis: Seen at time of exam today, patient does report a lot of itching in this area 3. Screening colonoscopy: Patient has not had a colonoscopy since 2003, she had canceled the one previously scheduled due to persistent fissure and fear of  worsening of this during prep for the colonoscopy, this was rescheduled today  Plan: 1. Recommend patient continue her nitroglycerin ointment 3 times daily to the inside of the rectum of the gloved finger 2. Continue sitz baths past 2-3 times daily 3. Continue lidocaine for when necessary pain relief 4. Scheduled patient for a colonoscopy in 1-2 months with Dr. Havery Moros for screening purposes. Reviewed risks, benefits, limitations and alternatives and the patient agrees to proceed. 5. Prescribed hydrocortisone cream 1% to be applied twice daily to the area surrounding the anal opening for irritation and itching, patient was advised to apply this for 1-2 weeks, if this irritation is not gone she should call our clinic and would likely need a dermatology referral 6. Patient to return to clinic per Dr. Doyne Keel recommendations after time of colonoscopy  Monica Newer, PA-C [facility] 04/04/2016, 10:33 AM  Cc: Unk Pinto, MD

## 2016-04-04 NOTE — Patient Instructions (Signed)
We have sent the following medications to your pharmacy for you to pick up at your convenience: Hydrocortisone Cream applied twice a day around the outside of rectum.  Continue Nitroglycerin ointment three times a day

## 2016-04-04 NOTE — Progress Notes (Signed)
Agree with assessment and plan as outlined. I examined the patient as well. Anal fissure appreciated and causing pain. She also has a dermatitis in the perianal area. Unclear if some of the wet wipes she is using may be related, I asked her to stop using these and will try hydrocortizone cream for a short trial. If no improvement she will contact us. Otherwise await colonoscopy result.

## 2016-04-11 ENCOUNTER — Encounter: Payer: BLUE CROSS/BLUE SHIELD | Admitting: Gastroenterology

## 2016-05-03 ENCOUNTER — Encounter: Payer: Self-pay | Admitting: Physician Assistant

## 2016-05-03 ENCOUNTER — Ambulatory Visit (INDEPENDENT_AMBULATORY_CARE_PROVIDER_SITE_OTHER): Payer: BLUE CROSS/BLUE SHIELD | Admitting: Physician Assistant

## 2016-05-03 ENCOUNTER — Telehealth: Payer: Self-pay

## 2016-05-03 VITALS — BP 124/82 | HR 88 | Temp 98.1°F | Resp 14 | Ht 64.0 in | Wt 181.6 lb

## 2016-05-03 DIAGNOSIS — E6609 Other obesity due to excess calories: Secondary | ICD-10-CM

## 2016-05-03 DIAGNOSIS — Z6835 Body mass index (BMI) 35.0-35.9, adult: Secondary | ICD-10-CM | POA: Diagnosis not present

## 2016-05-03 DIAGNOSIS — R030 Elevated blood-pressure reading, without diagnosis of hypertension: Secondary | ICD-10-CM | POA: Diagnosis not present

## 2016-05-03 DIAGNOSIS — Z79899 Other long term (current) drug therapy: Secondary | ICD-10-CM

## 2016-05-03 DIAGNOSIS — E785 Hyperlipidemia, unspecified: Secondary | ICD-10-CM | POA: Diagnosis not present

## 2016-05-03 DIAGNOSIS — E559 Vitamin D deficiency, unspecified: Secondary | ICD-10-CM

## 2016-05-03 MED ORDER — PHENTERMINE HCL 37.5 MG PO TABS: 37.5000 mg | ORAL_TABLET | Freq: Every day | ORAL | 2 refills | Status: DC

## 2016-05-03 NOTE — Patient Instructions (Addendum)
Who Qualifies for Obesity Medications? Although everyone is hopeful for a fast and easy way to lose weight, nothing has been shown to replace a prudent, calorie-controlled diet along with behavior modification as a cornerstone for all obesity treatments.  The next tool that can be used to achieve weight-loss and health improvement is medication. Pharmacotherapy may be offered to individuals affected by obesity who have failed to achieve weight-loss through diet and exercise alone. Currently there are several drugs that are approved by the FDA for weight-loss: phentermine products (Adipex-P or Suprenza)  lorcaserin HCI (Belviq) phentermine- topiramate ER (Qsymia)  Bupropion; Naltrexone ER (Contrave)  Let's take a closer look at each of these medications and learn how they work:  Phentermine (Adipex-P or Suprenza) How does it work? Phentermine is a medication available by prescription that works on chemicals in the brain to decrease your appetite. It also has a mild stimulant component that adds extra energy. Phentermine is a pill that is taken once a day in the morning time. Tolerance to this medication can develop, so it can only be used for several months at a time. Common side effects are dry mouth, sleeplessness, constipation. Weight-loss: The average weight-loss is 4-5 percent of your weight after one-year. In a 200 pound person, this means about 10 pounds of weight-loss. Patients who receive phentermine can usually expect to see greater weight-loss than those who receive non-pharmacologic care, on average about 13 pounds difference over 12 weeks as reported in one study. Concerns: Due to its stimulant effect, a person's blood pressure and heart rate may increase when on this medication; therefore, you must be monitored closely by a physician who is experienced in prescribing this medication. It cannot be used in patients with some heart conditions (such as poorly controlled blood pressure),  glaucoma (increased pressure in your eye), stroke or overactive thyroid. There is some concern for abuse, but this is minimal if the medication is appropriately used as directed by a healthcare professional.  Lorcaserin (Belviq) How does it work? Lorcaserin was approved in June 2012 by the FDA and became commercially available in June 2013. It works by helping you feel full while eating less, and it works on the chemicals in your brain to help decrease your appetite. Weight-loss: In individuals who took the medication for one-year, it has been shown to have an average of 7 percent weight-loss. In a 200 pound person, this would mean a 14 pound weight-loss. Blood sugar, cholesterol and blood pressure levels have also been shown to improve. Concerns: The most common side effects are headache, dizziness, fatigue, dry mouth, upper JGG:EZMOQHUT tract infection and nausea.  Response to therapy should be evaluated by week 12.  If a patient has not lost at least 5% of baseline body weigh  Phentermine-Topiramate ER (Qsymia) How does it work? This combination medication was approved by the FDA in July 2012. Topiramate is a medication used to treat seizures. It was found that a common side effect of this medication was weight-loss. Phentermine, as described in this brochure, helps to increase your energy and decrease your appetite. Weight-loss: Among individuals who took the highest does of Qsymia (15 mg phentermine and 92 mg of topiramate ER) for one-year, they achieved an average of 14.4 percent weight-loss. In a 200 pound person, a 14.4 percent weight-loss would mean a loss of 29 pounds. Cholesterol levels have also been shown to improve. Concerns: The most common side effects were dry mouth, constipation and pins-and-needle feeling in extremities. Qsymia should NOT  be taken during pregnancy since Topiramate ER, a component of Qsymia, has been associated with an increased risk of birth  defects.  Bupropion; Naltrexone ER (Contrave) How does it work? Works in two areas of your brain, hunger center and reward center to reduce hunger and cravings.  Weight loss In a 56 week trial patients lost more than 5% of their body weight.  Concerns Most common side effects are dry mouth, constipation or diarrhea, headache.  Please take it with a full glass of water and low fat meal.    Follow-up Visits: Patients are given the opportunity to revisit a topic or obtain more information on an area of interest during follow-up visits. The frequency of and interval between follow-up visits is determined on a patient-by-patient basis. Frequent visits (every 3 to 4 weeks) are encouraged until initial weight-loss goals (5 to 10 percent of body weight) are achieved. At that point, less frequent visits are typically scheduled as needed for individual patients. However, since obesity is considered a chronic life-long problem for many individuals, periodic continual follow up is recommended.   Research has shown that weight-loss as low as 5 percent of initial body weight can lead to favorable improvements in blood pressure, cholesterol, glucose levels and insulin sensitivity. The risk of developing heart disease is reduced the most in patients who have impaired glucose tolerance, type 2 diabetes or high blood pressure.    Simple math prevails.    1st - exercise does not produce significant weight loss - at best one converts fat into muscle , "bulks up", loses inches, but usually stays "weight neutral"     2nd - think of your body weightas a check book: If you eat more calories than you burn up - you save money or gain weight .... Or if you spend more money than you put in the check book, ie burn up more calories than you eat, then you lose weight     3rd - if you walk or run 1 mile, you burn up 100 calories - you have to burn up 3,500 calories to lose 1 pound, ie you have to walk/run 35 miles to lose  1 measly pound. So if you want to lose 10 #, then you have to walk/run 350 miles, so.... clearly exercise is not the solution.     4. So if you consume 1,500 calories, then you have to burn up the equivalent of 15 miles to stay weight neutral - It also stands to reason that if you consume 1,500 cal/day and don't lose weight, then you must be burning up about 1,500 cals/day to stay weight neutral.     5. If you really want to lose weight, you must cut your calorie intake 300 calories /day and at that rate you should lose about 1 # every 3 days.   6. Please purchase Dr Fara Olden Fuhrman's book(s) "The End of Dieting" & "Eat to Live" . It has some great concepts and recipes.        Before you even begin to attack a weight-loss plan, it pays to remember this: You are not fat. You have fat. Losing weight isn't about blame or shame; it's simply another achievement to accomplish. Dieting is like any other skill-you have to buckle down and work at it. As long as you act in a smart, reasonable way, you'll ultimately get where you want to be. Here are some weight loss pearls for you.  1. It's Not a Diet. It's a  Lifestyle Thinking of a diet as something you're on and suffering through only for the short term doesn't work. To shed weight and keep it off, you need to make permanent changes to the way you eat. It's OK to indulge occasionally, of course, but if you cut calories temporarily and then revert to your old way of eating, you'll gain back the weight quicker than you can say yo-yo. Use it to lose it. Research shows that one of the best predictors of long-term weight loss is how many pounds you drop in the first month. For that reason, nutritionists often suggest being stricter for the first two weeks of your new eating strategy to build momentum. Cut out added sugar and alcohol and avoid unrefined carbs. After that, figure out how you can reincorporate them in a way that's healthy and maintainable.  2. There's  a Right Way to Exercise Working out burns calories and fat and boosts your metabolism by building muscle. But those trying to lose weight are notorious for overestimating the number of calories they burn and underestimating the amount they take in. Unfortunately, your system is biologically programmed to hold on to extra pounds and that means when you start exercising, your body senses the deficit and ramps up its hunger signals. If you're not diligent, you'll eat everything you burn and then some. Use it to lose it. Cardio gets all the exercise glory, but strength and interval training are the real heroes. They help you build lean muscle, which in turn increases your metabolism and calorie-burning ability 3. Don't Overreact to Mild Hunger Some people have a hard time losing weight because of hunger anxiety. To them, being hungry is bad-something to be avoided at all costs-so they carry snacks with them and eat when they don't need to. Others eat because they're stressed out or bored. While you never want to get to the point of being ravenous (that's when bingeing is likely to happen), a hunger pang, a craving, or the fact that it's 3:00 p.m. should not send you racing for the vending machine or obsessing about the energy bar in your purse. Ideally, you should put off eating until your stomach is growling and it's difficult to concentrate.  Use it to lose it. When you feel the urge to eat, use the HALT method. Ask yourself, Am I really hungry? Or am I angry or anxious, lonely or bored, or tired? If you're still not certain, try the apple test. If you're truly hungry, an apple should seem delicious; if it doesn't, something else is going on. Or you can try drinking water and making yourself busy, if you are still hungry try a healthy snack.  4. Not All Calories Are Created Equal The mechanics of weight loss are pretty simple: Take in fewer calories than you use for energy. But the kind of food you eat makes  all the difference. Processed food that's high in saturated fat and refined starch or sugar can cause inflammation that disrupts the hormone signals that tell your brain you're full. The result: You eat a lot more.  Use it to lose it. Clean up your diet. Swap in whole, unprocessed foods, including vegetables, lean protein, and healthy fats that will fill you up and give you the biggest nutritional bang for your calorie buck. In a few weeks, as your brain starts receiving regular hunger and fullness signals once again, you'll notice that you feel less hungry overall and naturally start cutting back on the amount  you eat.  5. Protein, Produce, and Plant-Based Fats Are Your Weight-Loss Trinity Here's why eating the three Ps regularly will help you drop pounds. Protein fills you up. You need it to build lean muscle, which keeps your metabolism humming so that you can torch more fat. People in a weight-loss program who ate double the recommended daily allowance for protein (about 110 grams for a 150-pound woman) lost 70 percent of their weight from fat, while people who ate the RDA lost only about 40 percent, one study found. Produce is packed with filling fiber. "It's very difficult to consume too many calories if you're eating a lot of vegetables. Example: Three cups of broccoli is a lot of food, yet only 93 calories. (Fruit is another story. It can be easy to overeat and can contain a lot of calories from sugar, so be sure to monitor your intake.) Plant-based fats like olive oil and those in avocados and nuts are healthy and extra satiating.  Use it to lose it. Aim to incorporate each of the three Ps into every meal and snack. People who eat protein throughout the day are able to keep weight off, according to a study in the [place] of Clinical Nutrition. In addition to meat, poultry and seafood, good sources are beans, lentils, eggs, tofu, and yogurt. As for fat, keep portion sizes in check by  measuring out salad dressing, oil, and nut butters (shoot for one to two tablespoons). Finally, eat veggies or a little fruit at every meal. People who did that consumed 308 fewer calories but didn't feel any hungrier than when they didn't eat more produce.  7. How You Eat Is As Important As What You Eat In order for your brain to register that you're full, you need to focus on what you're eating. Sit down whenever you eat, preferably at a table. Turn off the TV or computer, put down your phone, and look at your food. Smell it. Chew slowly, and don't put another bite on your fork until you swallow. When women ate lunch this attentively, they consumed 30 percent less when snacking later than those who listened to an audiobook at lunchtime, according to a study in the [place] of Nutrition. 8. Weighing Yourself Really Works The scale provides the best evidence about whether your efforts are paying off. Seeing the numbers tick up or down or stagnate is motivation to keep going-or to rethink your approach. A 2015 study at [facility] found that daily weigh-ins helped people lose more weight, keep it off, and maintain that loss, even after two years. Use it to lose it. Step on the scale at the same time every day for the best results. If your weight shoots up several pounds from one weigh-in to the next, don't freak out. Eating a lot of salt the night before or having your period is the likely culprit. The number should return to normal in a day or two. It's a steady climb that you need to do something about. 9. Too Much Stress and Too Little Sleep Are Your Enemies When you're tired and frazzled, your body cranks up the production of cortisol, the stress hormone that can cause carb cravings. Not getting enough sleep also boosts your levels of ghrelin, a hormone associated with hunger, while suppressing leptin, a hormone that signals fullness and satiety. People on a diet who slept only five and a  half hours a night for two weeks lost 55 percent less fat and were  hungrier than those who slept eight and a half hours, according to a study in the [place]. Use it to lose it. Prioritize sleep, aiming for seven hours or more a night, which research shows helps lower stress. And make sure you're getting quality zzz's. If a snoring spouse or a fidgety cat wakes you up frequently throughout the night, you may end up getting the equivalent of just four hours of sleep, according to a study from [facility]. Keep pets out of the bedroom, and use a white-noise app to drown out snoring. 10. You Will Hit a plateau-And You Can Bust Through It As you slim down, your body releases much less leptin, the fullness hormone.  If you're not strength training, start right now. Building muscle can raise your metabolism to help you overcome a plateau. To keep your body challenged and burning calories, incorporate new moves and more intense intervals into your workouts or add another sweat session to your weekly routine. Alternatively, cut an extra 100 calories or so a day from your diet. Now that you've lost weight, your body simply doesn't need as much fuel.

## 2016-05-03 NOTE — Telephone Encounter (Signed)
Entered into the wrong chart to do refill/ ERROR

## 2016-05-03 NOTE — Progress Notes (Signed)
Assessment and Plan:    Elevated blood pressure reading without diagnosis of hypertension -     CBC with Differential/Platelet -     BASIC METABOLIC PANEL WITH GFR -     Hepatic function panel -     TSH  Class 2 obesity due to excess calories without serious comorbidity with body mass index (BMI) of 35.0 to 35.9 in adult -     phentermine (ADIPEX-P) 37.5 MG tablet; Take 1 tablet (37.5 mg total) by mouth daily before breakfast  Hyperlipidemia, unspecified hyperlipidemia type -     Lipid panel - -     phentermine (ADIPEX-P) 37.5 MG tablet; Take 1 tablet (37.5 mg total) by mouth daily before breakfast.  Elevated blood pressure reading without diagnosis of hypertension - continue medications, DASH diet, exercise and monitor at home. Call if greater than 130/80.  -     CBC with Differential/Platelet -     BASIC METABOLIC PANEL WITH GFR -     Hepatic function panel -     TSH   Continue diet and meds as discussed. Further disposition pending results of labs. Over 30 minutes of exam, counseling, chart review, and critical decision making was performed  No future appointments.   HPI 56 y.o. female  presents for 3 month follow up on hypertension, cholesterol, prediabetes, and vitamin D deficiency.   Her blood pressure has been controlled at home, today their BP is BP: 124/82   She does not workout. She denies chest pain, shortness of breath, dizziness.  She is not on cholesterol medication and denies myalgias. Her cholesterol is at goal. The cholesterol last visit was:   Lab Results  Component Value Date   CHOL 158 10/26/2015   HDL 48 10/26/2015   LDLCALC 74 10/26/2015   TRIG 179 (H) 10/26/2015   CHOLHDL 3.3 10/26/2015   Last A1C in the office was:  Lab Results  Component Value Date   HGBA1C 5.5 06/19/2013   Patient is on Vitamin D supplement.   Lab Results  Component Value Date   VD25OH 30 11/24/2013     BMI is Body mass index is 31.17 kg/m., she is working on diet and  exercise. She has been audited at work, has been having stress with his job, has been on phentermine at 5am.  Wt Readings from Last 3 Encounters:  05/03/16 181 lb 9.6 oz (82.4 kg)  04/04/16 178 lb (80.7 kg)  02/08/16 176 lb (79.8 kg)    Current Medications:  Current Outpatient Prescriptions on File Prior to Visit  Medication Sig Dispense Refill  . acyclovir (ZOVIRAX) 800 MG tablet Take 800 mg by mouth daily as needed.    . ALPRAZolam (XANAX) 0.5 MG tablet Take 1 tablet (0.5 mg total) by mouth 3 (three) times daily as needed for anxiety. 90 tablet 1  . AMBULATORY NON FORMULARY MEDICATION Place 0.125 mg rectally 3 (three) times daily. Medication Name: Nitroglycerin 1 Tube 3  . clobetasol ointment (TEMOVATE) 8.24 % Apply 1 application topically 2 (two) times daily as needed.    . diltiazem 2 % GEL Apply 1 application topically 2 (two) times daily. 30 g 3  . HYDROcodone-acetaminophen (NORCO) 5-325 MG per tablet Take 1 tablet by mouth every 6 (six) hours as needed for moderate pain. 60 tablet 0  . hydrocortisone (ANUSOL-HC) 2.5 % rectal cream Place 1 application rectally 2 (two) times daily. 30 g 1  . lidocaine (XYLOCAINE) 5 % ointment Apply 1 application topically 3 (three) times  daily as needed. 50 g 1  . meclizine (ANTIVERT) 25 MG tablet Take 25 mg by mouth 3 (three) times daily as needed for dizziness.    . Na Sulfate-K Sulfate-Mg Sulf (SUPREP BOWEL PREP KIT) 17.5-3.13-1.6 GM/180ML SOLN Take 1 kit by mouth as directed. 324 mL 0  . phentermine (ADIPEX-P) 37.5 MG tablet Take 1 tablet (37.5 mg total) by mouth daily before breakfast. 30 tablet 2   No current facility-administered medications on file prior to visit.    Medical History:  Past Medical History:  Diagnosis Date  . Anal fissure   . DDD (degenerative disc disease), cervical   . DDD (degenerative disc disease), lumbar   . Hyperlipidemia   . Hypertension   . IBS (irritable bowel syndrome)   . Lower back pain   . Neck pain   .  Plantar fasciitis of left foot   . Plantar fasciitis of right foot   . Unspecified vitamin D deficiency    Allergies:  Allergies  Allergen Reactions  . Aspirin Other (See Comments)    GI upset  . Mobic [Meloxicam] Other (See Comments)    GI upset  . Wellbutrin [Bupropion] Other (See Comments)    headache  . Z-Pak [Azithromycin] Other (See Comments)    thrush  . Penicillins Rash     Review of Systems:  Review of Systems  Constitutional: Negative.   HENT: Negative.   Eyes: Negative.   Respiratory: Negative.   Cardiovascular: Negative.   Gastrointestinal: Negative.   Genitourinary: Negative.   Musculoskeletal: Negative.   Skin: Negative.   Neurological: Negative.   Endo/Heme/Allergies: Negative.   Psychiatric/Behavioral: Negative.     Family history- Review and unchanged Social history- Review and unchanged Physical Exam: BP 124/82   Pulse 88   Temp 98.1 F (36.7 C)   Resp 14   Ht 5' 4" (1.626 m)   Wt 181 lb 9.6 oz (82.4 kg)   LMP 05/15/2010   SpO2 97%   BMI 31.17 kg/m  Wt Readings from Last 3 Encounters:  05/03/16 181 lb 9.6 oz (82.4 kg)  04/04/16 178 lb (80.7 kg)  02/08/16 176 lb (79.8 kg)   General Appearance: Well nourished, in no apparent distress. Eyes: PERRLA, EOMs, conjunctiva no swelling or erythema Sinuses: No Frontal/maxillary tenderness ENT/Mouth: Ext aud canals clear, TMs without erythema, bulging. No erythema, swelling, or exudate on post pharynx.  Tonsils not swollen or erythematous. Hearing normal.  Neck: Supple, thyroid normal.  Respiratory: Respiratory effort normal, BS equal bilaterally without rales, rhonchi, wheezing or stridor.  Cardio: RRR with no MRGs. Brisk peripheral pulses without edema.  Abdomen: Soft, + BS,  Non tender, no guarding, rebound, hernias, masses. Lymphatics: Non tender without lymphadenopathy.  Musculoskeletal: Full ROM, 5/5 strength, Normal gait Skin: Warm, dry without rashes, lesions, ecchymosis.  Neuro: Cranial  nerves intact. Normal muscle tone, no cerebellar symptoms. Psych: Awake and oriented X 3, normal affect, Insight and Judgment appropriate.    Amanda Collier, PA-C 5:39 PM [facility]   

## 2016-05-15 DIAGNOSIS — M4722 Other spondylosis with radiculopathy, cervical region: Secondary | ICD-10-CM | POA: Diagnosis not present

## 2016-05-15 DIAGNOSIS — M5412 Radiculopathy, cervical region: Secondary | ICD-10-CM | POA: Diagnosis not present

## 2016-05-20 ENCOUNTER — Other Ambulatory Visit: Payer: Self-pay | Admitting: Physician Assistant

## 2016-05-20 DIAGNOSIS — K602 Anal fissure, unspecified: Secondary | ICD-10-CM

## 2016-05-22 DIAGNOSIS — R040 Epistaxis: Secondary | ICD-10-CM | POA: Diagnosis not present

## 2016-05-31 ENCOUNTER — Encounter: Payer: BLUE CROSS/BLUE SHIELD | Admitting: Gastroenterology

## 2016-06-12 ENCOUNTER — Other Ambulatory Visit: Payer: Self-pay

## 2016-06-12 MED ORDER — ACYCLOVIR 800 MG PO TABS: 800.0000 mg | ORAL_TABLET | Freq: Every day | ORAL | 0 refills | Status: DC | PRN

## 2016-06-19 DIAGNOSIS — Z130 Encounter for screening for diseases of the blood and blood-forming organs and certain disorders involving the immune mechanism: Secondary | ICD-10-CM | POA: Diagnosis not present

## 2016-06-19 DIAGNOSIS — Z1389 Encounter for screening for other disorder: Secondary | ICD-10-CM | POA: Diagnosis not present

## 2016-06-19 DIAGNOSIS — Z1231 Encounter for screening mammogram for malignant neoplasm of breast: Secondary | ICD-10-CM | POA: Diagnosis not present

## 2016-06-19 DIAGNOSIS — Z01419 Encounter for gynecological examination (general) (routine) without abnormal findings: Secondary | ICD-10-CM | POA: Diagnosis not present

## 2016-06-19 DIAGNOSIS — Z6831 Body mass index (BMI) 31.0-31.9, adult: Secondary | ICD-10-CM | POA: Diagnosis not present

## 2016-06-19 DIAGNOSIS — G4709 Other insomnia: Secondary | ICD-10-CM | POA: Diagnosis not present

## 2016-07-04 ENCOUNTER — Ambulatory Visit (INDEPENDENT_AMBULATORY_CARE_PROVIDER_SITE_OTHER): Payer: BLUE CROSS/BLUE SHIELD | Admitting: Physician Assistant

## 2016-07-04 ENCOUNTER — Encounter: Payer: Self-pay | Admitting: Physician Assistant

## 2016-07-04 VITALS — BP 126/76 | HR 93 | Temp 97.7°F | Resp 16 | Ht 64.0 in | Wt 185.8 lb

## 2016-07-04 DIAGNOSIS — M791 Myalgia, unspecified site: Secondary | ICD-10-CM

## 2016-07-04 DIAGNOSIS — E6609 Other obesity due to excess calories: Secondary | ICD-10-CM

## 2016-07-04 DIAGNOSIS — R030 Elevated blood-pressure reading, without diagnosis of hypertension: Secondary | ICD-10-CM | POA: Diagnosis not present

## 2016-07-04 DIAGNOSIS — Z6835 Body mass index (BMI) 35.0-35.9, adult: Secondary | ICD-10-CM

## 2016-07-04 DIAGNOSIS — R1011 Right upper quadrant pain: Secondary | ICD-10-CM

## 2016-07-04 DIAGNOSIS — E785 Hyperlipidemia, unspecified: Secondary | ICD-10-CM | POA: Diagnosis not present

## 2016-07-04 DIAGNOSIS — D649 Anemia, unspecified: Secondary | ICD-10-CM | POA: Diagnosis not present

## 2016-07-04 DIAGNOSIS — Z79899 Other long term (current) drug therapy: Secondary | ICD-10-CM

## 2016-07-04 DIAGNOSIS — R5383 Other fatigue: Secondary | ICD-10-CM

## 2016-07-04 LAB — CBC WITH DIFFERENTIAL/PLATELET
BASOS ABS: 0 {cells}/uL (ref 0–200)
BASOS PCT: 0 %
EOS PCT: 2 %
Eosinophils Absolute: 196 cells/uL (ref 15–500)
HCT: 40.7 % (ref 35.0–45.0)
HEMOGLOBIN: 12.9 g/dL (ref 11.7–15.5)
LYMPHS ABS: 3626 {cells}/uL (ref 850–3900)
Lymphocytes Relative: 37 %
MCH: 25.5 pg — ABNORMAL LOW (ref 27.0–33.0)
MCHC: 31.7 g/dL — ABNORMAL LOW (ref 32.0–36.0)
MCV: 80.6 fL (ref 80.0–100.0)
MONOS PCT: 6 %
MPV: 9.6 fL (ref 7.5–12.5)
Monocytes Absolute: 588 cells/uL (ref 200–950)
NEUTROS ABS: 5390 {cells}/uL (ref 1500–7800)
Neutrophils Relative %: 55 %
Platelets: 290 10*3/uL (ref 140–400)
RBC: 5.05 MIL/uL (ref 3.80–5.10)
RDW: 13.9 % (ref 11.0–15.0)
WBC: 9.8 10*3/uL (ref 3.8–10.8)

## 2016-07-04 NOTE — Progress Notes (Signed)
Subjective:    Patient ID: Monica [name], female    DOB: [birth year], 56 y.o.   MRN: [id number]  HPI 56 y.o. obese WF with history of HTN, chol, HTN presents with fatigue, muscle pain/stiffness, and weight gain.   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 Having itching head, legs, has history of psoriasis. She has feet, ankle, leg soreness, last about 1 hour and feels better with movement. No known tick bite. Never had sleep study, husband states snores some, no gasping awake. Will rarely take hydrocodone.   She has has had GERD/RUQ pain, worse after food, some nausea, some radiation to RU back. No vomiting.   BMI is Body mass index is 31.89 kg/m., she is working on diet and exercise. Wt Readings from Last 10 Encounters:  07/04/16 185 lb 12.8 oz (84.3 kg)  05/03/16 181 lb 9.6 oz (82.4 kg)  04/04/16 178 lb (80.7 kg)  02/08/16 176 lb (79.8 kg)  01/19/16 178 lb 9.6 oz (81 kg)  10/26/15 189 lb 9.6 oz (86 kg)  06/15/15 189 lb (85.7 kg)  11/17/14 186 lb (84.4 kg)  09/02/14 176 lb (79.8 kg)  02/22/14 165 lb (74.8 kg)    Blood pressure 126/76, pulse 93, temperature 97.7 F (36.5 C), resp. rate 16, height 5' 4"  (1.626 m), weight 185 lb 12.8 oz (84.3 kg), last menstrual period 05/15/2010, SpO2 98 %.  Medications Current Outpatient Prescriptions on File Prior to Visit  Medication Sig  . acyclovir (ZOVIRAX) 800 MG tablet Take 1 tablet (800 mg total) by mouth daily as needed.  . ALPRAZolam (XANAX) 0.5 MG tablet Take 1 tablet (0.5 mg total) by mouth 3 (three) times daily as needed for anxiety.  . AMBULATORY NON FORMULARY MEDICATION Place 0.125 mg rectally 3 (three) times daily. Medication Name: Nitroglycerin  . clobetasol ointment (TEMOVATE) 6.94 % Apply 1 application topically 2 (two)  times daily as needed.  . diltiazem 2 % GEL Apply 1 application topically 2 (two) times daily.  Marland Kitchen HYDROcodone-acetaminophen (NORCO) 5-325 MG per tablet Take 1 tablet by mouth every 6 (six) hours as needed for moderate pain.  . hydrocortisone (ANUSOL-HC) 2.5 % rectal cream Place 1 application rectally 2 (two) times daily.  Marland Kitchen lidocaine (XYLOCAINE) 5 % ointment APPLY 1 APPLICATION TOPICALLY 3 (THREE) TIMES DAILY AS NEEDED.  Marland Kitchen meclizine (ANTIVERT) 25 MG tablet Take 25 mg by mouth 3 (three) times daily as needed for dizziness.  . Na Sulfate-K Sulfate-Mg Sulf (SUPREP BOWEL PREP KIT) 17.5-3.13-1.6 GM/180ML SOLN Take 1 kit by mouth as directed.   No current facility-administered medications on file prior to visit.     Problem list She has Family history of ischemic heart disease; History of tobacco abuse; Vitamin D deficiency; Elevated blood pressure reading without diagnosis of hypertension; Hyperlipidemia; DDD (degenerative disc disease), cervical; and DDD (degenerative disc disease), lumbar on her problem list.   Review of Systems  Constitutional: Positive for fatigue. Negative for activity change, appetite change, chills, diaphoresis, fever and unexpected weight change.  HENT: Negative.   Respiratory: Negative.   Cardiovascular: Negative.   Gastrointestinal: Positive for constipation. Negative for abdominal distention, abdominal pain, anal bleeding, blood in stool, diarrhea, nausea, rectal pain and vomiting.  Genitourinary: Negative.   Musculoskeletal: Positive for arthralgias, joint swelling and  myalgias. Negative for back pain, gait problem, neck pain and neck stiffness.  Neurological: Negative.   Psychiatric/Behavioral: Positive for sleep disturbance.      Objective:   Physical Exam  Constitutional: She is oriented to person, place, and time. She appears well-developed and well-nourished. No distress.  HENT:  Head: Normocephalic and atraumatic.  Eyes: Conjunctivae and EOM are normal.  Pupils are equal, round, and reactive to light.  Neck: Normal range of motion. Neck supple.  Cardiovascular: Normal rate, regular rhythm and normal heart sounds.   No murmur heard. Pulmonary/Chest: Effort normal and breath sounds normal. She has no wheezes.  Abdominal: Soft. Bowel sounds are normal. She exhibits no mass. There is tenderness (RUQ). There is no rebound and no guarding.  Musculoskeletal: Normal range of motion.  Lymphadenopathy:    She has no cervical adenopathy.  Neurological: She is alert and oriented to person, place, and time.  Skin: Skin is warm and dry. Rash (bilateral legs, erytehmatous papular rash on anterior shins only. ) noted.       Assessment & Plan:   Elevated blood pressure reading without diagnosis of hypertension - continue medications, DASH diet, exercise and monitor at home. Call if greater than 130/80.  -     CBC with Differential/Platelet -     BASIC METABOLIC PANEL WITH GFR -     Hepatic function panel -     TSH -     Urinalysis, Routine w reflex microscopic  Hyperlipidemia, unspecified hyperlipidemia type -continue medications, check lipids, decrease fatty foods, increase activity.   Class 2 obesity due to excess calories without serious comorbidity with body mass index (BMI) of 35.0 to 35.9 in adult Get back on phentermine for now Discussed diet -     Hemoglobin A1c  Medication management -     Magnesium  Fatigue, unspecified type No caffeine after lunch, no water 1-2 hours before bed ? Sleep apnea, will discuss with husband, may need study/referral Check labs, ? Psoriatic arthritis, declines tick labs at this time - observation for worsening or other new symptoms such as pain, fever or weight loss, running a few tests, as ordered, reducing weight with a more healthy, balanced diet, reducing stress where possible, getting more rest and sleep and starting an exercise program -     Sedimentation rate -     ANA -     Anti-DNA antibody,  double-stranded -     Rheumatoid factor -     CK -     HLA-B27 antigen  Anemia, unspecified type -     Iron and TIBC -     Vitamin B12  RUQ pain -     US Abdomen Complete; Future - get on zantac, rule out gallbladder

## 2016-07-04 NOTE — Patient Instructions (Addendum)
No caffeine or tea after lunch No water 1-2 hours before bed   Fatigue Fatigue is feeling tired all of the time, a lack of energy, or a lack of motivation. Occasional or mild fatigue is often a normal response to activity or life in general. However, long-lasting (chronic) or extreme fatigue may indicate an underlying medical condition. Follow these instructions at home: Watch your fatigue for any changes. The following actions may help to lessen any discomfort you are feeling:  Talk to your health care provider about how much sleep you need each night. Try to get the required amount every night.  Take medicines only as directed by your health care provider.  Eat a healthy and nutritious diet. Ask your health care provider if you need help changing your diet.  Drink enough fluid to keep your urine clear or pale yellow.  Practice ways of relaxing, such as yoga, meditation, massage therapy, or acupuncture.  Exercise regularly.  Change situations that cause you stress. Try to keep your work and personal routine reasonable.  Do not abuse illegal drugs.  Limit alcohol intake to no more than 1 drink per day for nonpregnant women and 2 drinks per day for men. One drink equals 12 ounces of beer, 5 ounces of wine, or 1 ounces of hard liquor.  Take a multivitamin, if directed by your health care provider.  Contact a health care provider if:  Your fatigue does not get better.  You have a fever.  You have unintentional weight loss or gain.  You have headaches.  You have difficulty: ? Falling asleep. ? Sleeping throughout the night.  You feel angry, guilty, anxious, or sad.  You are unable to have a bowel movement (constipation).  You skin is dry.  Your legs or another part of your body is swollen. Get help right away if:  You feel confused.  Your vision is blurry.  You feel faint or pass out.  You have a severe headache.  You have severe abdominal, pelvic, or back  pain.  You have chest pain, shortness of breath, or an irregular or fast heartbeat.  You are unable to urinate or you urinate less than normal.  You develop abnormal bleeding, such as bleeding from the rectum, vagina, nose, lungs, or nipples.  You vomit blood.  You have thoughts about harming yourself or committing suicide.  You are worried that you might harm someone else. This information is not intended to replace advice given to you by your health care provider. Make sure you discuss any questions you have with your health care provider. Document Released: 10/29/2006 Document Revised: 06/09/2015 Document Reviewed: 05/05/2013 Elsevier Interactive Patient Education  [employer].

## 2016-07-05 LAB — HEPATIC FUNCTION PANEL
ALT: 7 U/L (ref 6–29)
AST: 15 U/L (ref 10–35)
Albumin: 4.5 g/dL (ref 3.6–5.1)
Alkaline Phosphatase: 85 U/L (ref 33–130)
BILIRUBIN DIRECT: 0.1 mg/dL (ref <=0.2)
BILIRUBIN INDIRECT: 0.3 mg/dL (ref 0.2–1.2)
BILIRUBIN TOTAL: 0.4 mg/dL (ref 0.2–1.2)
Total Protein: 7 g/dL (ref 6.1–8.1)

## 2016-07-05 LAB — HEMOGLOBIN A1C
HEMOGLOBIN A1C: 5.4 % (ref <5.7)
Mean Plasma Glucose: 108 mg/dL

## 2016-07-05 LAB — BASIC METABOLIC PANEL WITH GFR
BUN: 8 mg/dL (ref 7–25)
CHLORIDE: 104 mmol/L (ref 98–110)
CO2: 21 mmol/L (ref 20–31)
Calcium: 9.5 mg/dL (ref 8.6–10.4)
Creat: 0.62 mg/dL (ref 0.50–1.05)
GFR, Est African American: >89 mL/min (ref >=60)
Glucose, Bld: 90 mg/dL (ref 65–99)
POTASSIUM: 4.6 mmol/L (ref 3.5–5.3)
SODIUM: 140 mmol/L (ref 135–146)

## 2016-07-05 LAB — URINALYSIS, ROUTINE W REFLEX MICROSCOPIC

## 2016-07-05 LAB — IRON AND TIBC
%SAT: 22 % (ref 11–50)
IRON: 81 ug/dL (ref 45–160)
TIBC: 371 ug/dL (ref 250–450)
UIBC: 290 ug/dL

## 2016-07-05 LAB — RHEUMATOID FACTOR

## 2016-07-05 LAB — TSH: TSH: 1.51 m[IU]/L

## 2016-07-05 LAB — SEDIMENTATION RATE: Sed Rate: 4 mm/hr (ref 0–30)

## 2016-07-05 LAB — MAGNESIUM: MAGNESIUM: 2.2 mg/dL (ref 1.5–2.5)

## 2016-07-05 LAB — CK: Total CK: 100 U/L (ref 29–143)

## 2016-07-05 LAB — VITAMIN B12: VITAMIN B 12: 285 pg/mL (ref 200–1100)

## 2016-07-05 LAB — ANTI-DNA ANTIBODY, DOUBLE-STRANDED

## 2016-07-05 LAB — ANA: ANA: NEGATIVE

## 2016-07-10 LAB — HLA-B27 ANTIGEN: DNA Result:: POSITIVE — AB

## 2016-07-10 NOTE — Addendum Note (Signed)
Addended by: Vicie Mutters R on: 07/10/2016 05:52 PM   Modules accepted: Orders

## 2016-07-16 ENCOUNTER — Other Ambulatory Visit: Payer: Self-pay | Admitting: Physician Assistant

## 2016-07-16 ENCOUNTER — Ambulatory Visit
Admission: RE | Admit: 2016-07-16 | Discharge: 2016-07-16 | Disposition: A | Discharge status: Home / Self Care | Payer: BLUE CROSS/BLUE SHIELD | Source: Ambulatory Visit | Attending: Physician Assistant | Admitting: Physician Assistant

## 2016-07-16 DIAGNOSIS — R1011 Right upper quadrant pain: Secondary | ICD-10-CM

## 2016-07-25 ENCOUNTER — Encounter (HOSPITAL_COMMUNITY)
Admission: RE | Admit: 2016-07-25 | Discharge: 2016-07-25 | Disposition: A | Discharge status: Home / Self Care | Payer: BLUE CROSS/BLUE SHIELD | Source: Ambulatory Visit | Attending: Physician Assistant | Admitting: Physician Assistant

## 2016-07-25 DIAGNOSIS — R1011 Right upper quadrant pain: Secondary | ICD-10-CM | POA: Diagnosis not present

## 2016-07-25 MED ORDER — FLUDEOXYGLUCOSE F - 18 (FDG) INJECTION: 5.0000 | Freq: Once | INTRAVENOUS | Status: DC | PRN

## 2016-07-25 MED ORDER — TECHNETIUM TC 99M MEBROFENIN IV KIT
5.0000 | PACK | Freq: Once | INTRAVENOUS | Status: AC | PRN
  Administered 2016-07-25: 5 via INTRAVENOUS

## 2016-08-01 ENCOUNTER — Encounter: Payer: Self-pay | Admitting: Physician Assistant

## 2016-08-13 DIAGNOSIS — M5412 Radiculopathy, cervical region: Secondary | ICD-10-CM | POA: Diagnosis not present

## 2016-08-13 DIAGNOSIS — M4722 Other spondylosis with radiculopathy, cervical region: Secondary | ICD-10-CM | POA: Diagnosis not present

## 2016-08-24 ENCOUNTER — Other Ambulatory Visit: Payer: Self-pay | Admitting: Physician Assistant

## 2016-08-24 DIAGNOSIS — K602 Anal fissure, unspecified: Secondary | ICD-10-CM

## 2016-09-05 ENCOUNTER — Encounter: Payer: Self-pay | Admitting: Gastroenterology

## 2016-10-04 DIAGNOSIS — Z1589 Genetic susceptibility to other disease: Secondary | ICD-10-CM | POA: Insufficient documentation

## 2016-10-04 NOTE — Progress Notes (Signed)
Office Visit Note  Patient: Monica [name]             Date of Birth: 04-29-1960           MRN: [id number]             PCP: Unk Pinto, MD Referring: Vicie Mutters, PA-C Visit Date: 10/18/2016 Occupation: secretary    Subjective:  Joint stiffness.   History of Present Illness: Monica [name] is a 56 y.o. female seen in consultation per request of her PCP. According to patient her symptoms started about a year ago with increasing stiffness in the morning. She states she also gets stiff after prolonged sitting. 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 She has had chronic discomfort in her C-spine and lumbar spine due to underlying disc disease. She also reports some discomfort in her left hip. She denies any joint swelling.  Activities of Daily Living:  Patient reports morning stiffness for 10-15 minutes.   Patient Denies nocturnal pain.  Difficulty dressing/grooming: Denies Difficulty climbing stairs: Reports Difficulty getting out of chair: Reports Difficulty using hands for taps, buttons, cutlery, and/or writing: Reports   Review of Systems  Constitutional: Positive for fatigue. Negative for night sweats, weight gain, weight loss and weakness.  HENT: Positive for mouth dryness. Negative for mouth sores, trouble swallowing, trouble swallowing and nose dryness.   Eyes: Positive for dryness. Negative for pain, redness and visual disturbance.  Respiratory: Negative.  Negative for cough, shortness of breath and difficulty breathing.   Cardiovascular: Negative for chest pain, palpitations, hypertension, irregular heartbeat and swelling in legs/feet.  Gastrointestinal: Positive for constipation and diarrhea. Negative for blood in stool.       History of IBS  Endocrine: Negative for increased urination.  Genitourinary: Negative for  vaginal dryness.  Musculoskeletal: Positive for arthralgias, joint pain, morning stiffness and muscle tenderness. Negative for joint swelling, myalgias, muscle weakness and myalgias.  Skin: Negative.  Negative for color change, rash, hair loss, skin tightness, ulcers and sensitivity to sunlight.  Allergic/Immunologic: Negative for susceptible to infections.  Neurological: Negative for dizziness, numbness, headaches, memory loss and night sweats.  Hematological: Negative for swollen glands.  Psychiatric/Behavioral: Negative.  Negative for depressed mood and sleep disturbance. The patient is not nervous/anxious.     PMFS History:  Patient Active Problem List   Diagnosis Date Noted  . History of IBS 10/18/2016  . HLA B27 (HLA B27 positive) 10/04/2016  . Vitamin D deficiency   . Elevated blood pressure reading without diagnosis of hypertension   . Hyperlipidemia   . DDD (degenerative disc disease), cervical   . DDD (degenerative disc disease), lumbar   . Family history of ischemic heart disease 11/28/2012  . History of tobacco abuse 11/28/2012    Past Medical History:  Diagnosis Date  . Anal fissure   . DDD (degenerative disc disease), cervical   . DDD (degenerative disc disease), lumbar   . Hyperlipidemia   . Hypertension    Patient denies.  . IBS (irritable bowel syndrome)   . Lower back pain   . Neck pain   . Plantar fasciitis of left foot   . Plantar fasciitis of right foot   . Unspecified vitamin D deficiency     Family History  Problem Relation Age of Onset  . Heart disease Father   .  Diabetes Father   . Hyperlipidemia Father   . Hypertension Father   . Cervical cancer Sister   . Hyperlipidemia Mother   . Hypertension Mother   . Stroke Maternal Aunt   . Cancer Maternal Aunt        breast x 2 different aunts  . Heart disease Maternal Grandmother   . Heart disease Maternal Grandfather   . Colon cancer Neg Hx   . Esophageal cancer Neg Hx   . Pancreatic cancer Neg  Hx   . Rectal cancer Neg Hx   . Stomach cancer Neg Hx    Past Surgical History:  Procedure Laterality Date  . CESAREAN SECTION    . lower back     L4 and L5   . NECK SURGERY     metal plate and pins in neck  . PLANTAR FASCIA SURGERY     both feet   Social History   Social History Narrative  . No narrative on file     Objective: Vital Signs: BP 121/66 (BP Location: Left Arm, Patient Position: Sitting, Cuff Size: Normal)   Pulse 67   Ht 5' 4"  (1.626 m)   Wt 186 lb (84.4 kg)   LMP 05/15/2010   BMI 31.93 kg/m    Physical Exam  Constitutional: She is oriented to person, place, and time. She appears well-developed and well-nourished.  HENT:  Head: Normocephalic and atraumatic.  Eyes: Conjunctivae and EOM are normal.  Neck: Normal range of motion.  Cardiovascular: Normal rate, regular rhythm, normal heart sounds and intact distal pulses.   Pulmonary/Chest: Effort normal and breath sounds normal.  Abdominal: Soft. Bowel sounds are normal.  Lymphadenopathy:    She has no cervical adenopathy.  Neurological: She is alert and oriented to person, place, and time.  Skin: Skin is warm and dry. Capillary refill takes less than 2 seconds.  Psychiatric: She has a normal mood and affect. Her behavior is normal.  Nursing note and vitals reviewed.    Musculoskeletal Exam: C-spine limitation with range of motion without discomfort. Thoracic spine good range of motion. Lumbar spine good range of motion. No SI joint tenderness was noted. Shoulder joints, elbow joints, wrist joints, MCPs with good range of motion. She has PIP/DIP thickening without any synovitis consistent with osteoarthritis. She is some discomfort range of motion of her left hip joint and tenderness on palpation of her left trochanteric bursa consistent with trochanteric bursitis. Knee joints ankles MTPs PIPs DIPs with good range of motion with no synovitis. She has some generalized hyperalgesia.  CDAI Exam: No CDAI exam  completed.    Investigation: Findings:  07/04/16 HLA-B27 antigen positive, ANA negative, Anti-DNA antibody, double-stranded negative, CK normal, Hemoglobin A1C normal, Hepatic function panel normal, Iron and TIBC normal, Magnesium normal, Rheumatoid factor normal,Sedimentation rate normal, TSH normal.,Urinalysis, Routine w reflex microscopic normal, and Vitamin B12 normal.      Imaging: Xr Hip Unilat W Or W/o Pelvis 2-3 Views Left  Result Date: 10/18/2016 No hip joint narrowing was noted. No sclerosis and spurring was noted. No chondrocalcinosis was noted. SI joints did not show any joint space narrowing or sclerosis.   Speciality Comments: No specialty comments available.    Procedures:  Large Joint Inj Date/Time: 10/18/2016 9:30 AM Performed by: Bo Merino Authorized by: Bo Merino   Consent Given by:  Patient Site marked: the procedure site was marked   Timeout: prior to procedure the correct patient, procedure, and site was verified   Indications:  Pain Location:  Hip Site:  L greater trochanter Prep: patient was prepped and draped in usual sterile fashion   Needle Size:  27 G Needle Length:  1.5 inches Approach:  Lateral Ultrasound Guidance: No   Fluoroscopic Guidance: No   Arthrogram: No   Medications:  40 mg triamcinolone acetonide 40 MG/ML; 1.5 mL lidocaine 1 % Aspiration Attempted: No   Aspirate amount (mL):  0 Patient tolerance:  Patient tolerated the procedure well with no immediate complications   Allergies: Aspirin; Mobic [meloxicam]; Wellbutrin [bupropion]; Z-pak [azithromycin]; and Penicillins   Assessment / Plan:     Visit Diagnoses: Other fatigue: She complains of some ongoing fatigue for multiple years.  Myalgia - she has hyperalgesia and tenderness to touch. All her labs have been normal. I believe she may have a component of myofascial pain syndrome. CK normal, ANA-, RF-, ESR normal  HLA B27 (HLA B27 positive): She has no evidence of  any synovitis on examination. I reviewed her old x-rays of C-spine and lumbar spine which showed only degenerative changes. She has good mobility in her lumbar spine and no SI joint tenderness.  Primary osteoarthritis of both hands: She has DIP PIP thickening in her hands but she is not very symptomatic.  Pain of left hip joint - Plan: XR HIP UNILAT W OR W/O PELVIS 2-3 VIEWS LEFT. Her hip joint x-ray was unremarkable.  Trochanteric bursitis of left hip: She had left trochanteric bursitis. After different treatment options were discussed and informed consent was obtained left trochanteric bursa was injected with cortisone. I've also given her a handout on IT band exercises.  DDD (degenerative disc disease), cervical: She had history of fusion. She is doing pretty well with minimal discomfort.  DDD (degenerative disc disease), lumbar: She has occasional off-and-on discomfort but has good mobility. Weight loss diet and exercise was discussed.    Vitamin D deficiency: She occasionally takes  supplement.  History of IBS: She has chronic problems with diarrhea alternating with constipation.  History of tobacco abuse : She quit smoking about 4 years ago.   Orders: Orders Placed This Encounter  Procedures  . Large Joint Injection/Arthrocentesis  . XR HIP UNILAT W OR W/O PELVIS 2-3 VIEWS LEFT   No orders of the defined types were placed in this encounter.   Face-to-face time spent with patient was 50 minutes. Greater than 50% of time was spent in counseling and coordination of care.  Follow-Up Instructions: Return if symptoms worsen or fail to improve, for DDD OA.   Bo Merino, MD  Note - This record has been created using Editor, commissioning.  Chart creation errors have been sought, but may not always  have been located. Such creation errors do not reflect on  the standard of medical care.

## 2016-10-15 NOTE — Progress Notes (Signed)
Complete Physical  Assessment and Plan:  Elevated blood pressure reading without diagnosis of hypertension - continue medications, DASH diet, exercise and monitor at home. Call if greater than 130/80.  -     CBC with Differential/Platelet -     BASIC METABOLIC PANEL WITH GFR -     Hepatic function panel -     TSH -     Urinalysis, Routine w reflex microscopic -     Microalbumin / creatinine urine ratio  Hyperlipidemia, unspecified hyperlipidemia type -continue medications, check lipids, decrease fatty foods, increase activity.  -     Lipid panel  Vitamin D deficiency -     VITAMIN D 25 Hydroxy (Vit-D Deficiency, Fractures)  HLA B27 (HLA B27 positive) Monitor  Family history of ischemic heart disease -     EKG 12-Lead  History of tobacco abuse -     EKG 12-Lead  DDD (degenerative disc disease), cervical Monitor  DDD (degenerative disc disease), lumbar Monitor  Medication management -     Magnesium  Screening, anemia, deficiency, iron -     Iron,Total/Total Iron Binding Cap -     Vitamin B12  Class 2 obesity due to excess calories without serious comorbidity with body mass index (BMI) of 35.0 to 35.9 in adult - long discussion about weight loss, diet, and exercise  Encounter for general adult medical examination with abnormal findings 1 year  Viral screen HIV//Hep C  Discussed med's effects and SE's. Screening labs and tests as requested with regular follow-up as recommended. Over 40 minutes of exam, counseling, chart review, and complex, high level critical decision making was performed this visit.   HPI  56 y.o. female  presents for a complete physical and follow up for has Family history of ischemic heart disease; History of tobacco abuse; Vitamin D deficiency; Elevated blood pressure reading without diagnosis of hypertension; Hyperlipidemia; DDD (degenerative disc disease), cervical; DDD (degenerative disc disease), lumbar; and HLA B27 (HLA B27 positive) on  her problem list..  Her blood pressure has been controlled at home, today their BP is BP: 130/86 She does not workout. She denies chest pain, shortness of breath, dizziness.  Having anal fissure, treated by GI, getting colonoscopy next week.  She had a normal AB [place] and HIDA  She is not on cholesterol medication and denies myalgias. Her cholesterol is at goal. The cholesterol last visit was:   Lab Results  Component Value Date   CHOL 158 10/26/2015   HDL 48 10/26/2015   LDLCALC 74 10/26/2015   TRIG 179 (H) 10/26/2015   CHOLHDL 3.3 10/26/2015    Last A1C in the office was:  Lab Results  Component Value Date   HGBA1C 5.4 07/04/2016   Last GFR: Lab Results  Component Value Date   GFRNONAA >89 07/04/2016   Patient is on Vitamin D supplement.   Lab Results  Component Value Date   VD25OH 30 11/24/2013     BMI is Body mass index is 31.21 kg/m., she is working on diet and exercise. She is drinking sweet tea.  Wt Readings from Last 3 Encounters:  10/17/16 181 lb 12.8 oz (82.5 kg)  10/16/16 185 lb 3.2 oz (84 kg)  07/04/16 185 lb 12.8 oz (84.3 kg)    Current Medications:  Current Outpatient Prescriptions on File Prior to Visit  Medication Sig Dispense Refill  . acyclovir (ZOVIRAX) 800 MG tablet Take 1 tablet (800 mg total) by mouth daily as needed. 90 tablet 0  . AMBULATORY NON  FORMULARY MEDICATION Place 0.125 mg rectally 3 (three) times daily. Medication Name: Nitroglycerin 1 Tube 3  . clobetasol ointment (TEMOVATE) 7.61 % Apply 1 application topically 2 (two) times daily as needed.    . diltiazem 2 % GEL Apply 1 application topically 2 (two) times daily. 30 g 3  . HYDROcodone-acetaminophen (NORCO) 5-325 MG per tablet Take 1 tablet by mouth every 6 (six) hours as needed for moderate pain. 60 tablet 0  . lidocaine (XYLOCAINE) 5 % ointment APPLY 1 APPLICATION TOPICALLY 3 (THREE) TIMES DAILY AS NEEDED. 35.44 g 1  . meclizine (ANTIVERT) 25 MG tablet Take 25 mg by mouth 3 (three) times  daily as needed for dizziness.    . phentermine 37.5 MG capsule Take 37.5 mg by mouth every morning. Phentermine was discontinued temporarily prior to colonoscopy. Last dose was 10/16/16. Patient will resume after her procedure.    Marland Kitchen PROCTOSOL HC 2.5 % rectal cream PLACE 1 APPLICATION RECTALLY 2 (TWO) TIMES DAILY. 30 g 1   No current facility-administered medications on file prior to visit.    Allergies:  Allergies  Allergen Reactions  . Aspirin Other (See Comments)    GI upset  . Mobic [Meloxicam] Other (See Comments)    GI upset  . Wellbutrin [Bupropion] Other (See Comments)    headache  . Z-Pak [Azithromycin] Other (See Comments)    thrush  . Penicillins Rash   Medical History:  She has Family history of ischemic heart disease; History of tobacco abuse; Vitamin D deficiency; Elevated blood pressure reading without diagnosis of hypertension; Hyperlipidemia; DDD (degenerative disc disease), cervical; DDD (degenerative disc disease), lumbar; and HLA B27 (HLA B27 positive) on her problem list. Health Maintenance:   Immunization History  Administered Date(s) Administered  . Hepatitis B 01/16/2000  . PPD Test 02/18/2013  . Pneumococcal Polysaccharide-23 01/15/1993, 02/18/2013  . Tdap 09/16/2006   Tetanus: 2008 DUE Pneumovax: 2015 Prevnar 13: age 42 Flu vaccine: declines Zostavax: declines  Pap: gets GYN MGM: 2017 gets at GYN DEXA: defer age 36 Colonoscopy: 2003, getting next week EGD: Ct spine 2015  Patient Care Team: Unk Pinto, MD as PCP - General (Internal Medicine)  Surgical History:  She has a past surgical history that includes lower back; Neck surgery; Plantar fascia surgery; and Cesarean section. Family History:  Herfamily history includes Cancer in her maternal aunt; Cervical cancer in her sister; Diabetes in her father; Heart disease in her father, maternal grandfather, and maternal grandmother; Hyperlipidemia in her father and mother; Hypertension in her  father and mother; Stroke in her maternal aunt. Social History:  She reports that she quit smoking about 2 years ago. Her smoking use included Cigarettes. She quit after 22.00 years of use. She has never used smokeless tobacco. She reports that she does not drink alcohol or use drugs.  Review of Systems: ROS  Physical Exam: Estimated body mass index is 31.21 kg/m as calculated from the following:   Height as of this encounter: 5' 4"  (1.626 m).   Weight as of this encounter: 181 lb 12.8 oz (82.5 kg). BP 130/86   Pulse 71   Temp (!) 97.3 F (36.3 C)   Resp 14   Ht 5' 4"  (1.626 m)   Wt 181 lb 12.8 oz (82.5 kg)   LMP 05/15/2010   SpO2 98%   BMI 31.21 kg/m  General Appearance: Well nourished, in no apparent distress.  Eyes: PERRLA, EOMs, conjunctiva no swelling or erythema, normal fundi and vessels.  Sinuses: No Frontal/maxillary tenderness  ENT/Mouth: Ext aud canals clear, normal light reflex with TMs without erythema, bulging. Good dentition. No erythema, swelling, or exudate on post pharynx. Tonsils not swollen or erythematous. Hearing normal.  Neck: Supple, thyroid normal. No bruits  Respiratory: Respiratory effort normal, BS equal bilaterally without rales, rhonchi, wheezing or stridor.  Cardio: RRR without murmurs, rubs or gallops. Brisk peripheral pulses without edema.  Chest: symmetric, with normal excursions and percussion.  Breasts: Symmetric, without lumps, nipple discharge, retractions.  Abdomen: Soft, nontender, no guarding, rebound, hernias, masses, or organomegaly.  Lymphatics: Non tender without lymphadenopathy.  Genitourinary:  Musculoskeletal: Full ROM all peripheral extremities,5/5 strength, and normal gait.  Skin: Warm, dry without rashes, lesions, ecchymosis. Neuro: Cranial nerves intact, reflexes equal bilaterally. Normal muscle tone, no cerebellar symptoms. Sensation intact.  Psych: Awake and oriented X 3, normal affect, Insight and Judgment appropriate.    EKG: WNL no ST changes. AORTA SCAN: WNL   Vicie Mutters 9:20 AM [facility]

## 2016-10-16 ENCOUNTER — Ambulatory Visit (AMBULATORY_SURGERY_CENTER): Payer: Self-pay

## 2016-10-16 VITALS — Ht 64.0 in | Wt 185.2 lb

## 2016-10-16 DIAGNOSIS — Z1211 Encounter for screening for malignant neoplasm of colon: Secondary | ICD-10-CM

## 2016-10-16 MED ORDER — NA SULFATE-K SULFATE-MG SULF 17.5-3.13-1.6 GM/177ML PO SOLN: 1.0000 | Freq: Once | ORAL | 0 refills | Status: AC

## 2016-10-16 NOTE — Progress Notes (Signed)
Denies allergies to eggs or soy products. Denies complication of anesthesia or sedation. Denies use of weight loss medication. Denies use of O2.   Emmi instructions declined.    Phentermine was discontinued 10 days prior to colonoscopy scheduled on 10/30/16. Last dose was taken on 10/16/16. Patient could take it a couple more days, however she verbalizes that she will not take any more until after her procedure on 1016/18.

## 2016-10-17 ENCOUNTER — Ambulatory Visit (INDEPENDENT_AMBULATORY_CARE_PROVIDER_SITE_OTHER): Payer: BLUE CROSS/BLUE SHIELD | Admitting: Physician Assistant

## 2016-10-17 ENCOUNTER — Encounter: Payer: Self-pay | Admitting: Physician Assistant

## 2016-10-17 VITALS — BP 130/86 | HR 71 | Temp 97.3°F | Resp 14 | Ht 64.0 in | Wt 181.8 lb

## 2016-10-17 DIAGNOSIS — E559 Vitamin D deficiency, unspecified: Secondary | ICD-10-CM | POA: Diagnosis not present

## 2016-10-17 DIAGNOSIS — I10 Essential (primary) hypertension: Secondary | ICD-10-CM | POA: Diagnosis not present

## 2016-10-17 DIAGNOSIS — Z0000 Encounter for general adult medical examination without abnormal findings: Secondary | ICD-10-CM

## 2016-10-17 DIAGNOSIS — M5030 Other cervical disc degeneration, unspecified cervical region: Secondary | ICD-10-CM

## 2016-10-17 DIAGNOSIS — Z1589 Genetic susceptibility to other disease: Secondary | ICD-10-CM

## 2016-10-17 DIAGNOSIS — R030 Elevated blood-pressure reading, without diagnosis of hypertension: Secondary | ICD-10-CM

## 2016-10-17 DIAGNOSIS — Z0001 Encounter for general adult medical examination with abnormal findings: Secondary | ICD-10-CM

## 2016-10-17 DIAGNOSIS — M5136 Other intervertebral disc degeneration, lumbar region: Secondary | ICD-10-CM

## 2016-10-17 DIAGNOSIS — Z79899 Other long term (current) drug therapy: Secondary | ICD-10-CM

## 2016-10-17 DIAGNOSIS — E6609 Other obesity due to excess calories: Secondary | ICD-10-CM

## 2016-10-17 DIAGNOSIS — Z87891 Personal history of nicotine dependence: Secondary | ICD-10-CM

## 2016-10-17 DIAGNOSIS — Z8249 Family history of ischemic heart disease and other diseases of the circulatory system: Secondary | ICD-10-CM

## 2016-10-17 DIAGNOSIS — Z1159 Encounter for screening for other viral diseases: Secondary | ICD-10-CM

## 2016-10-17 DIAGNOSIS — Z6835 Body mass index (BMI) 35.0-35.9, adult: Secondary | ICD-10-CM

## 2016-10-17 DIAGNOSIS — Z130 Encounter for screening for diseases of the blood and blood-forming organs and certain disorders involving the immune mechanism: Secondary | ICD-10-CM

## 2016-10-17 DIAGNOSIS — E785 Hyperlipidemia, unspecified: Secondary | ICD-10-CM

## 2016-10-17 NOTE — Patient Instructions (Addendum)
Check out intermittent fasting online  Drink at least 80-100 oz a day  STOP SWEET TEA  We want weight loss that will last so you should lose 1-2 pounds a week.  THAT IS IT! Please pick THREE things a month to change. Once it is a habit check off the item. Then pick another three items off the list to become habits.  If you are already doing a habit on the list GREAT!  Cross that item off! o Don't drink your calories. Ie, alcohol, soda, fruit juice, and sweet tea.  o Drink more water. Drink a glass when you feel hungry or before each meal.  o Eat breakfast - Complex carb and protein (likeDannon light and fit yogurt, oatmeal, fruit, eggs, [place] bacon). o Measure your cereal.  Eat no more than one cup a day. (ie [place]) o Eat an apple a day. o Add a vegetable a day. o Try a new vegetable a month. o Use Pam! Stop using oil or butter to cook. o Don't finish your plate or use smaller plates. o Share your dessert. o Eat sugar free Jello for dessert or frozen grapes. o Don't eat 2-3 hours before bed. o Switch to whole wheat bread, pasta, and brown rice. o Make healthier choices when you eat out. No fries! o Pick baked chicken, NOT fried. o Don't forget to SLOW DOWN when you eat. It is not going anywhere.  o Take the stairs. o Park far away in the parking lot o [employer] (or weights) for 10 minutes while watching TV. o Walk at work for 10 minutes during break. o Walk outside 1 time a week with your friend, kids, dog, or significant other. o Start a walking group at [place] the mall as much as you can tolerate.  o Keep a food diary. o Weigh yourself daily. o Walk for 15 minutes 3 days per week. o Cook at home more often and eat out less.  If life happens and you go back to old habits, it is okay.  Just start over. You can do it!   If you experience chest pain, get short of breath, or tired during the exercise, please stop immediately and inform your doctor.      Bad carbs  also include fruit juice, alcohol, and sweet tea. These are empty calories that do not signal to your brain that you are full.   Please remember the good carbs are still carbs which convert into sugar. So please measure them out no more than 1/2-1 cup of rice, oatmeal, pasta, and beans  Veggies are however free foods! Pile them on.   Not all fruit is created equal. Please see the list below, the fruit at the bottom is higher in sugars than the fruit at the top. Please avoid all dried fruits.

## 2016-10-18 ENCOUNTER — Ambulatory Visit (INDEPENDENT_AMBULATORY_CARE_PROVIDER_SITE_OTHER): Payer: BLUE CROSS/BLUE SHIELD | Admitting: Rheumatology

## 2016-10-18 ENCOUNTER — Encounter: Payer: Self-pay | Admitting: Rheumatology

## 2016-10-18 ENCOUNTER — Ambulatory Visit (INDEPENDENT_AMBULATORY_CARE_PROVIDER_SITE_OTHER): Payer: BLUE CROSS/BLUE SHIELD

## 2016-10-18 VITALS — BP 121/66 | HR 67 | Ht 64.0 in | Wt 186.0 lb

## 2016-10-18 DIAGNOSIS — M25552 Pain in left hip: Secondary | ICD-10-CM

## 2016-10-18 DIAGNOSIS — Z1589 Genetic susceptibility to other disease: Secondary | ICD-10-CM | POA: Diagnosis not present

## 2016-10-18 DIAGNOSIS — M791 Myalgia, unspecified site: Secondary | ICD-10-CM | POA: Diagnosis not present

## 2016-10-18 DIAGNOSIS — M5136 Other intervertebral disc degeneration, lumbar region: Secondary | ICD-10-CM

## 2016-10-18 DIAGNOSIS — M19041 Primary osteoarthritis, right hand: Secondary | ICD-10-CM | POA: Diagnosis not present

## 2016-10-18 DIAGNOSIS — M19042 Primary osteoarthritis, left hand: Secondary | ICD-10-CM | POA: Diagnosis not present

## 2016-10-18 DIAGNOSIS — E559 Vitamin D deficiency, unspecified: Secondary | ICD-10-CM

## 2016-10-18 DIAGNOSIS — R5383 Other fatigue: Secondary | ICD-10-CM | POA: Diagnosis not present

## 2016-10-18 DIAGNOSIS — M7062 Trochanteric bursitis, left hip: Secondary | ICD-10-CM

## 2016-10-18 DIAGNOSIS — M5030 Other cervical disc degeneration, unspecified cervical region: Secondary | ICD-10-CM | POA: Diagnosis not present

## 2016-10-18 DIAGNOSIS — Z87891 Personal history of nicotine dependence: Secondary | ICD-10-CM

## 2016-10-18 DIAGNOSIS — M51369 Other intervertebral disc degeneration, lumbar region without mention of lumbar back pain or lower extremity pain: Secondary | ICD-10-CM

## 2016-10-18 DIAGNOSIS — Z8719 Personal history of other diseases of the digestive system: Secondary | ICD-10-CM | POA: Diagnosis not present

## 2016-10-18 LAB — TSH: TSH: 1.57 m[IU]/L (ref 0.40–4.50)

## 2016-10-18 LAB — IRON, TOTAL/TOTAL IRON BINDING CAP
%SAT: 24 % (calc) (ref 11–50)
IRON: 85 ug/dL (ref 45–160)
TIBC: 350 ug/dL (ref 250–450)

## 2016-10-18 LAB — CBC WITH DIFFERENTIAL/PLATELET
BASOS ABS: 47 {cells}/uL (ref 0–200)
Basophils Relative: 0.5 %
Eosinophils Absolute: 102 cells/uL (ref 15–500)
Eosinophils Relative: 1.1 %
HEMATOCRIT: 40.3 % (ref 35.0–45.0)
Hemoglobin: 12.9 g/dL (ref 11.7–15.5)
LYMPHS ABS: 2706 {cells}/uL (ref 850–3900)
MCH: 25.4 pg — AB (ref 27.0–33.0)
MCHC: 32 g/dL (ref 32.0–36.0)
MCV: 79.3 fL — AB (ref 80.0–100.0)
MPV: 10.2 fL (ref 7.5–12.5)
Monocytes Relative: 5.7 %
NEUTROS ABS: 5915 {cells}/uL (ref 1500–7800)
Neutrophils Relative %: 63.6 %
Platelets: 297 10*3/uL (ref 140–400)
RBC: 5.08 10*6/uL (ref 3.80–5.10)
RDW: 12.2 % (ref 11.0–15.0)
Total Lymphocyte: 29.1 %
WBC mixed population: 530 cells/uL (ref 200–950)
WBC: 9.3 10*3/uL (ref 3.8–10.8)

## 2016-10-18 LAB — HEPATIC FUNCTION PANEL
AG RATIO: 1.8 (calc) (ref 1.0–2.5)
ALKALINE PHOSPHATASE (APISO): 85 U/L (ref 33–130)
ALT: 7 U/L (ref 6–29)
AST: 15 U/L (ref 10–35)
Albumin: 4.4 g/dL (ref 3.6–5.1)
BILIRUBIN INDIRECT: 0.5 mg/dL (ref 0.2–1.2)
Bilirubin, Direct: 0.1 mg/dL (ref 0.0–0.2)
Globulin: 2.5 g/dL (calc) (ref 1.9–3.7)
Total Bilirubin: 0.6 mg/dL (ref 0.2–1.2)
Total Protein: 6.9 g/dL (ref 6.1–8.1)

## 2016-10-18 LAB — BASIC METABOLIC PANEL WITH GFR
BUN: 11 mg/dL (ref 7–25)
CHLORIDE: 102 mmol/L (ref 98–110)
CO2: 27 mmol/L (ref 20–32)
Calcium: 9.7 mg/dL (ref 8.6–10.4)
Creat: 0.67 mg/dL (ref 0.50–1.05)
GFR, EST AFRICAN AMERICAN: 114 mL/min/{1.73_m2} (ref >=60)
GFR, Est Non African American: 98 mL/min/{1.73_m2} (ref >=60)
GLUCOSE: 94 mg/dL (ref 65–99)
Potassium: 4.6 mmol/L (ref 3.5–5.3)
SODIUM: 137 mmol/L (ref 135–146)

## 2016-10-18 LAB — URINALYSIS, ROUTINE W REFLEX MICROSCOPIC
Bilirubin Urine: NEGATIVE
GLUCOSE, UA: NEGATIVE
Hgb urine dipstick: NEGATIVE
KETONES UR: NEGATIVE
Leukocytes, UA: NEGATIVE
NITRITE: NEGATIVE
PH: 7 (ref 5.0–8.0)
Protein, ur: NEGATIVE
SPECIFIC GRAVITY, URINE: 1.017 (ref 1.001–1.03)

## 2016-10-18 LAB — LIPID PANEL
CHOL/HDL RATIO: 3.2 (calc) (ref <5.0)
CHOLESTEROL: 213 mg/dL — AB (ref <200)
HDL: 66 mg/dL (ref >50)
LDL Cholesterol (Calc): 122 mg/dL (calc) — ABNORMAL HIGH
Non-HDL Cholesterol (Calc): 147 mg/dL (calc) — ABNORMAL HIGH (ref <130)
Triglycerides: 135 mg/dL (ref <150)

## 2016-10-18 LAB — MICROALBUMIN / CREATININE URINE RATIO
Creatinine, Urine: 72 mg/dL (ref 20–275)
MICROALB/CREAT RATIO: 4 ug/mg{creat} (ref <30)
Microalb, Ur: 0.3 mg/dL

## 2016-10-18 LAB — VITAMIN D 25 HYDROXY (VIT D DEFICIENCY, FRACTURES): Vit D, 25-Hydroxy: 27 ng/mL — ABNORMAL LOW (ref 30–100)

## 2016-10-18 LAB — MAGNESIUM: MAGNESIUM: 2.2 mg/dL (ref 1.5–2.5)

## 2016-10-18 LAB — VITAMIN B12: Vitamin B-12: 227 pg/mL (ref 200–1100)

## 2016-10-18 MED ORDER — LIDOCAINE HCL 1 % IJ SOLN
1.5000 mL | INTRAMUSCULAR | Status: AC | PRN
  Administered 2016-10-18: 1.5 mL

## 2016-10-18 MED ORDER — TRIAMCINOLONE ACETONIDE 40 MG/ML IJ SUSP
40.0000 mg | INTRAMUSCULAR | Status: AC | PRN
Start: 2016-10-18 — End: 2016-10-18
  Administered 2016-10-18: 40 mg via INTRA_ARTICULAR

## 2016-10-18 NOTE — Patient Instructions (Signed)
Iliotibial Bursitis Rehab Ask your health care provider which exercises are safe for you. Do exercises exactly as told by your health care provider and adjust them as directed. It is normal to feel mild stretching, pulling, tightness, or discomfort as you do these exercises, but you should stop right away if you feel sudden pain or your pain gets worse.Do not begin these exercises until told by your health care provider. Stretching and range of motion exercises These exercises warm up your muscles and joints and improve the movement and flexibility of your leg. These exercises also help to relieve pain and stiffness. Exercise A: Quadriceps stretch, prone  1. Lie on your abdomen on a firm surface, such as a bed or padded floor. 2. Bend your left / right knee and hold your ankle. If you cannot reach your ankle or pant leg, loop a belt around your foot and grab the belt instead. 3. Gently pull your heel toward your buttocks. Your knee should not slide out to the side. You should feel a stretch in the front of your thigh and knee. 4. Hold this position for __________ seconds. Repeat __________ times. Complete this exercise __________ times a day. Exercise B: Lunge ( adductor stretch) 1. Stand and spread your legs about 3 feet (about 1 m) apart. Put your left / right leg slightly back for balance. 2. Lean away from your left / right leg by bending your other knee and shifting your weight toward your bent knee. You may rest your hands on your thigh for balance. You should feel a stretch in your left / right inner thigh. 3. Hold for __________ seconds. Repeat __________ times. Complete this exercise __________ times a day. Exercise C: Hamstring stretch, supine  1. Lie on your back. 2. Hold both ends of a belt or towel as you loop it over the ball of your left / right foot. The ball of your foot is on the walking surface, right under your toes. 3. Straighten your left / right knee and slowly pull on  the belt to raise your leg. Stop when you feel a gentle stretch in the back of your left / right knee or thigh. ? Do not let your left / right knee bend. ? Keep your other leg flat on the floor. 4. Hold this position for __________ seconds. Repeat __________ times. Complete this exercise __________ times a day. Strengthening exercises These exercises build strength and endurance in your leg. Endurance is the ability to use your muscles for a long time, even after they get tired. Exercise D: Quadriceps wall slides  1. Lean your back against a smooth wall or door while you walk your feet out 18-24 inches (46-61 cm) from it. 2. Place your feet hip-width apart. 3. Slowly slide down the wall or door until your knees bend as far as told by your health care provider. Keep your knees over your heels, not your toes. Keep your knees in line with your hips. 4. Hold for __________ seconds. 5. Push through your heels to stand up to rest for __________ seconds after each repetition. Repeat __________ times. Complete this exercise __________ times a day. Exercise E: Straight leg raises ( hip abductors) 1. Lie on your side, with your left / right leg in the top position. Lie so your head, shoulder, knee, and hip line up with each other. You may bend your bottom knee to help you balance. 2. Lift your top leg 4-6 inches (10-15 cm) while keeping your   toes pointed straight ahead. 3. Hold this position for __________ seconds. 4. Slowly lower your leg to the starting position. Allow your muscles to relax completely after each repetition. Repeat __________ times. Complete this exercise __________ times a day. Exercise F: Straight leg raises ( hip extensors) 1. Lie on your abdomen on a firm surface. You can put a pillow under your hips if that is more comfortable. 2. Tense the muscles in your buttocks and lift your left / right leg about 4-6 inches (10-15 cm). Keep your knee straight as you lift your leg. 3. Hold  this position for __________ seconds. 4. Slowly lower your leg to the starting position. 5. Let your leg relax completely after each repetition. Repeat __________ times. Complete this exercise __________ times a day. Exercise G: Bridge ( hip extensors) 1. Lie on your back on a firm surface with your knees bent and your feet flat on the floor. 2. Tighten your buttocks muscles and lift your bottom off the floor until your trunk is level with your thighs. ? Do not arch your back. ? You should feel the muscles working in your buttocks and the back of your thighs. If you do not feel these muscles, slide your feet 1-2 inches (2.5-5 cm) farther away from your buttocks. 3. Hold this position for __________ seconds. 4. Slowly lower your hips to the starting position. 5. Let your buttocks muscles relax completely between repetitions. 6. If this exercise is too easy, try doing it with your arms crossed over your chest. Repeat __________ times. Complete this exercise __________ times a day. This information is not intended to replace advice given to you by your health care provider. Make sure you discuss any questions you have with your health care provider. Document Released: 01/01/2005 Document Revised: 09/08/2015 Document Reviewed: 12/14/2014 Elsevier Interactive Patient Education  2018 [employer]  

## 2016-10-29 ENCOUNTER — Encounter: Payer: Self-pay | Admitting: Physician Assistant

## 2016-10-29 ENCOUNTER — Telehealth: Payer: Self-pay | Admitting: Gastroenterology

## 2016-10-30 ENCOUNTER — Encounter: Payer: BLUE CROSS/BLUE SHIELD | Admitting: Gastroenterology

## 2016-11-06 DIAGNOSIS — R030 Elevated blood-pressure reading, without diagnosis of hypertension: Secondary | ICD-10-CM | POA: Diagnosis not present

## 2016-11-06 DIAGNOSIS — Z6832 Body mass index (BMI) 32.0-32.9, adult: Secondary | ICD-10-CM | POA: Diagnosis not present

## 2016-11-06 DIAGNOSIS — M4722 Other spondylosis with radiculopathy, cervical region: Secondary | ICD-10-CM | POA: Diagnosis not present

## 2016-11-06 DIAGNOSIS — M5412 Radiculopathy, cervical region: Secondary | ICD-10-CM | POA: Diagnosis not present

## 2016-11-26 ENCOUNTER — Ambulatory Visit: Payer: BLUE CROSS/BLUE SHIELD | Admitting: Rheumatology

## 2016-12-14 ENCOUNTER — Ambulatory Visit (AMBULATORY_SURGERY_CENTER): Payer: BLUE CROSS/BLUE SHIELD | Admitting: Gastroenterology

## 2016-12-14 ENCOUNTER — Encounter: Payer: Self-pay | Admitting: Gastroenterology

## 2016-12-14 VITALS — BP 123/87 | HR 87 | Temp 97.8°F | Resp 57 | Ht 64.0 in | Wt 185.0 lb

## 2016-12-14 DIAGNOSIS — K635 Polyp of colon: Secondary | ICD-10-CM | POA: Diagnosis not present

## 2016-12-14 DIAGNOSIS — Z1211 Encounter for screening for malignant neoplasm of colon: Secondary | ICD-10-CM | POA: Diagnosis not present

## 2016-12-14 DIAGNOSIS — K621 Rectal polyp: Secondary | ICD-10-CM

## 2016-12-14 DIAGNOSIS — R197 Diarrhea, unspecified: Secondary | ICD-10-CM | POA: Diagnosis not present

## 2016-12-14 DIAGNOSIS — K529 Noninfective gastroenteritis and colitis, unspecified: Secondary | ICD-10-CM | POA: Diagnosis not present

## 2016-12-14 DIAGNOSIS — D123 Benign neoplasm of transverse colon: Secondary | ICD-10-CM | POA: Diagnosis not present

## 2016-12-14 DIAGNOSIS — D128 Benign neoplasm of rectum: Secondary | ICD-10-CM

## 2016-12-14 MED ORDER — SODIUM CHLORIDE 0.9 % IV SOLN: 500.0000 mL | INTRAVENOUS | Status: DC

## 2016-12-14 NOTE — Patient Instructions (Signed)
YOU HAD AN ENDOSCOPIC PROCEDURE TODAY AT [facility]:   Refer to the procedure report that was given to you for any specific questions about what was found during the examination.  If the procedure report does not answer your questions, please call your gastroenterologist to clarify.  If you requested that your care partner not be given the details of your procedure findings, then the procedure report has been included in a sealed envelope for you to review at your convenience later.  YOU SHOULD EXPECT: Some feelings of bloating in the abdomen. Passage of more gas than usual.  Walking can help get rid of the air that was put into your GI tract during the procedure and reduce the bloating. If you had a lower endoscopy (such as a colonoscopy or flexible sigmoidoscopy) you may notice spotting of blood in your stool or on the toilet paper. If you underwent a bowel prep for your procedure, you may not have a normal bowel movement for a few days.  Please Note:  You might notice some irritation and congestion in your nose or some drainage.  This is from the oxygen used during your procedure.  There is no need for concern and it should clear up in a day or so.  SYMPTOMS TO REPORT IMMEDIATELY:   Following lower endoscopy (colonoscopy or flexible sigmoidoscopy):  Excessive amounts of blood in the stool  Significant tenderness or worsening of abdominal pains  Swelling of the abdomen that is new, acute  Fever of 100F or higher   For urgent or emergent issues, a gastroenterologist can be reached at any hour by calling [phone].   DIET:  We do recommend a small meal at first, but then you may proceed to your regular diet.  Drink plenty of fluids but you should avoid alcoholic beverages for 24 hours.  ACTIVITY:  You should plan to take it easy for the rest of today and you should NOT DRIVE or use heavy machinery until tomorrow (because of the sedation medicines used during the test).     FOLLOW UP: Our staff will call the number listed on your records the next business day following your procedure to check on you and address any questions or concerns that you may have regarding the information given to you following your procedure. If we do not reach you, we will leave a message.  However, if you are feeling well and you are not experiencing any problems, there is no need to return our call.  We will assume that you have returned to your regular daily activities without incident.  If any biopsies were taken you will be contacted by phone or by letter within the next 1-3 weeks.  Please call us at [phone] if you have not heard about the biopsies in 3 weeks.    SIGNATURES/CONFIDENTIALITY: You and/or your care partner have signed paperwork which will be entered into your electronic medical record.  These signatures attest to the fact that that the information above on your After Visit Summary has been reviewed and is understood.  Full responsibility of the confidentiality of this discharge information lies with you and/or your care-partner.  No NSAIDS: aspirin, aleve, ibuprofen for two weeks per Dr. Havery Moros.

## 2016-12-14 NOTE — Progress Notes (Signed)
A/ox3, pleased with MAC, report to  [employer]

## 2016-12-14 NOTE — Progress Notes (Signed)
Called to room to assist during endoscopic procedure.  Patient ID and intended procedure confirmed with present staff. Received instructions for my participation in the procedure from the performing physician.  

## 2016-12-14 NOTE — Op Note (Signed)
[facility] Patient Name: Monica [name] Procedure Date: 12/14/2016 7:51 AM MRN: [id number] Endoscopist: Remo Lipps P. Gabby Rackers MD, MD Age: 56 Referring MD:  Date of Birth: [birth year] Gender: Female Account #: [id number] Procedure:                Colonoscopy Indications:              Chronic diarrhea, history of recurrent anal                            fissure, also for screening purposes Medicines:                Monitored Anesthesia Care Procedure:                Pre-Anesthesia Assessment:                           - Prior to the procedure, a History and Physical                            was performed, and patient medications and                            allergies were reviewed. The patient's tolerance of                            previous anesthesia was also reviewed. The risks                            and benefits of the procedure and the sedation                            options and risks were discussed with the patient.                            All questions were answered, and informed consent                            was obtained. Prior Anticoagulants: The patient has                            taken no previous anticoagulant or antiplatelet                            agents. ASA Grade Assessment: II - A patient with                            mild systemic disease. After reviewing the risks                            and benefits, the patient was deemed in                            satisfactory condition to undergo the procedure.  After obtaining informed consent, the colonoscope                            was passed under direct vision. Throughout the                            procedure, the patient's blood pressure, pulse, and                            oxygen saturations were monitored continuously. The                            Colonoscope was introduced through the anus and                            advanced to the  the terminal ileum, with                            identification of the appendiceal orifice and IC                            valve. The colonoscopy was performed without                            difficulty. The patient tolerated the procedure                            well. The quality of the bowel preparation was                            good. The terminal ileum, ileocecal valve,                            appendiceal orifice, and rectum were photographed. Scope In: 1:95:09 AM Scope Out: 8:21:37 AM Scope Withdrawal Time: 0 hours 13 minutes 14 seconds  Total Procedure Duration: 0 hours 15 minutes 19 seconds  Findings:                 The perianal and digital rectal examinations were                            normal. Interval healing of anal fissure.                           The terminal ileum appeared normal.                           A 5 mm polyp was found in the transverse colon. The                            polyp was sessile. The polyp was removed with a                            cold snare. Resection and retrieval were complete.  A 3 mm polyp was found in the rectum. The polyp was                            flat. The polyp was removed with a cold biopsy                            forceps. Resection and retrieval were complete.                           Internal hemorrhoids were found during retroflexion.                           The exam was otherwise without abnormality.                           Biopsies for histology were taken with a cold                            forceps from the right colon, left colon and                            transverse colon for evaluation of microscopic                            colitis. Complications:            No immediate complications. Estimated blood loss:                            Minimal. Estimated Blood Loss:     Estimated blood loss was minimal. Impression:               - The examined portion of the  ileum was normal.                           - One 5 mm polyp in the transverse colon, removed                            with a cold snare. Resected and retrieved.                           - One 3 mm polyp in the rectum, removed with a cold                            biopsy forceps. Resected and retrieved.                           - Internal hemorrhoids.                           - The examination was otherwise normal.                           - Biopsies were taken with a cold forceps from the  right colon, left colon and transverse colon for                            evaluation of microscopic colitis.                           - Anal fissure appeared healed on exam today. Recommendation:           - Patient has a contact number available for                            emergencies. The signs and symptoms of potential                            delayed complications were discussed with the                            patient. Return to normal activities tomorrow.                            Written discharge instructions were provided to the                            patient.                           - Resume previous diet.                           - Continue present medications.                           - Immodium as needed for diarrhea                           - Await pathology results.                           - Repeat colonoscopy is recommended for                            surveillance. The colonoscopy date will be                            determined after pathology results from today's                            exam become available for review.                           - No ibuprofen, naproxen, or other non-steroidal                            anti-inflammatory drugs for 2 weeks after polyp  removal. Remo Lipps P. Colton Tassin MD, MD 12/14/2016 8:26:47 AM This report has been signed electronically.

## 2016-12-17 ENCOUNTER — Telehealth: Payer: Self-pay | Admitting: *Deleted

## 2016-12-17 NOTE — Telephone Encounter (Signed)
No answer for second follow up call. Left message for patient to call with questions or concerns. SM

## 2016-12-17 NOTE — Telephone Encounter (Signed)
  Follow up Call-  Call back number 12/14/2016  Post procedure Call Back phone  # [phone]  Permission to leave phone message Yes  Some recent data might be hidden     Patient questions:  Message left to call us if necessary.

## 2016-12-19 ENCOUNTER — Other Ambulatory Visit: Payer: Self-pay | Admitting: Physician Assistant

## 2016-12-19 NOTE — Telephone Encounter (Signed)
Rx called in 

## 2016-12-22 ENCOUNTER — Encounter: Payer: Self-pay | Admitting: Gastroenterology

## 2016-12-26 ENCOUNTER — Telehealth: Payer: Self-pay

## 2016-12-26 NOTE — Telephone Encounter (Signed)
Call came into the recovery room from pt.  She had received pathology letter.  Pt asked what screening for malignancy ment on her report and also asked if she needed to do anything for her hemorrhoids that are present since the diarrhea.  First I explained that "screening" means we are doing a colonoscopy to check for polyps and removed them if they are found.  Also I asked pt if she was having any sx with her hemorrhoids such as bleeding, pain or swelling.  Pt said no.  She really did not know they were there.  I advised her to call us back and see Dr. Enis Gash if they become an issue.  Pt said she would.  maw

## 2017-01-18 ENCOUNTER — Other Ambulatory Visit: Payer: Self-pay | Admitting: Physician Assistant

## 2017-01-18 ENCOUNTER — Encounter: Payer: Self-pay | Admitting: Physician Assistant

## 2017-01-18 MED ORDER — METRONIDAZOLE 0.75 % EX CREA: TOPICAL_CREAM | Freq: Two times a day (BID) | CUTANEOUS | 1 refills | Status: DC

## 2017-02-04 DIAGNOSIS — M5412 Radiculopathy, cervical region: Secondary | ICD-10-CM | POA: Diagnosis not present

## 2017-02-04 DIAGNOSIS — M4722 Other spondylosis with radiculopathy, cervical region: Secondary | ICD-10-CM | POA: Diagnosis not present

## 2017-02-04 DIAGNOSIS — Z6832 Body mass index (BMI) 32.0-32.9, adult: Secondary | ICD-10-CM | POA: Diagnosis not present

## 2017-02-15 ENCOUNTER — Other Ambulatory Visit: Payer: Self-pay | Admitting: Physician Assistant

## 2017-03-11 NOTE — Progress Notes (Signed)
Assessment and Plan:   Elevated blood pressure reading without diagnosis of hypertension - continue medications, DASH diet, exercise and monitor at home. Call if greater than 130/80.  -     CBC with Differential/Platelet -     BASIC METABOLIC PANEL WITH GFR -     Hepatic function panel -     TSH  Hyperlipidemia, unspecified hyperlipidemia type -continue medications, check lipids, decrease fatty foods, increase activity.  -     Lipid panel  Fatigue, unspecified type - Discussed lifestyle modification as means of resolving problem, Training modifications discussed, See orders for lab evaluation, Discussed how depression can be a cause of fatigue if all labs are negative will discuss depression treatment.  -     Insulin, random -     Iron,Total/Total Iron Binding Cap -     Vitamin B12  Rash -     nystatin ointment (MYCOSTATIN); Apply 1 application topically 2 (two) times daily.  BMI 33.0-33.9,adult Has failed phentermine, will try topamax for sleep/weight loss Check labs Add on vitamin D Follow up 6 weeks -     topiramate (TOPAMAX) 25 MG tablet; Take 1 tablet (25 mg total) by mouth at bedtime.    Continue diet and meds as discussed. Further disposition pending results of labs. Over 30 minutes of exam, counseling, chart review, and critical decision making was performed  Future Appointments  Date Time Provider [place]  10/24/2017  9:00 AM Vicie Mutters, PA-C [id number] None     HPI 56 y.o. female  presents for 3 month follow up on hypertension, cholesterol, prediabetes, and vitamin D deficiency.   She states when she gets up she feels her hands and feet are swollen and stiff, worse in the AM, better after 30 mins in the morning. She has had negative ANA, AntiDNA, RF, sed rate,  + HLAB27, has seen a rheumatologist in the past. No rash.   Has rash on left abdominal inguinal fold.   Her blood pressure has been controlled at home, today their BP is BP: 112/76    She does not workout. She denies chest pain, shortness of breath, dizziness.  She is not on cholesterol medication and denies myalgias. Her cholesterol is at goal. The cholesterol last visit was:   Lab Results  Component Value Date   CHOL 213 (H) 10/17/2016   HDL 66 10/17/2016   LDLCALC 74 10/26/2015   TRIG 135 10/17/2016   CHOLHDL 3.2 10/17/2016   Last A1C in the office was:  Lab Results  Component Value Date   HGBA1C 5.4 07/04/2016   Patient is on Vitamin D supplement.   Lab Results  Component Value Date   VD25OH 27 (L) 10/17/2016     BMI is Body mass index is 33.47 kg/m., she is working on diet and exercise. She has stopped the phentermine and her weight is up 10 lbs. She has stopped sweet tea and doing just water. 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 She states she has severe fatigue, was falling asleep yesterday. She is not sleeping well, she will sleep 4 hours and then she is up all night. She falls asleep quickly, she does snore. She has tried wellbutrin before. Has never had sleep study.  Wt Readings from Last 3 Encounters:  03/13/17 195 lb (88.5 kg)  12/14/16 185 lb (83.9 kg)  10/18/16 186 lb (84.4  kg)    Current Medications:  Current Outpatient Medications on File Prior to Visit  Medication Sig Dispense Refill  . acyclovir (ZOVIRAX) 800 MG tablet Take 1 tablet (800 mg total) by mouth daily as needed. 90 tablet 0  . clobetasol ointment (TEMOVATE) 7.42 % Apply 1 application topically 2 (two) times daily as needed.    Marland Kitchen HYDROcodone-acetaminophen (NORCO) 5-325 MG per tablet Take 1 tablet by mouth every 6 (six) hours as needed for moderate pain. 60 tablet 0  . meclizine (ANTIVERT) 25 MG tablet Take 25 mg by mouth 3 (three) times daily as needed for dizziness.    . metroNIDAZOLE (METROCREAM) 0.75 % cream Apply topically 2 (two) times daily. 60 g 1  . AMBULATORY NON FORMULARY MEDICATION  Place 0.125 mg rectally 3 (three) times daily. Medication Name: Nitroglycerin (Patient not taking: Reported on 03/13/2017) 1 Tube 3  . diltiazem 2 % GEL Apply 1 application topically 2 (two) times daily. 30 g 3  . lidocaine (XYLOCAINE) 5 % ointment APPLY 1 APPLICATION TOPICALLY 3 (THREE) TIMES DAILY AS NEEDED. (Patient not taking: Reported on 03/13/2017) 35.44 g 1  . phentermine (ADIPEX-P) 37.5 MG tablet TAKE 1 TABLET BY MOUTH EVERY DAY BEFORE BREAKFAST (Patient not taking: Reported on 03/13/2017) 30 tablet 1  . PROCTOSOL HC 2.5 % rectal cream PLACE 1 APPLICATION RECTALLY 2 (TWO) TIMES DAILY. (Patient not taking: Reported on 12/14/2016) 30 g 1   No current facility-administered medications on file prior to visit.    Medical History:  Past Medical History:  Diagnosis Date  . Anal fissure   . DDD (degenerative disc disease), cervical   . DDD (degenerative disc disease), lumbar   . Hyperlipidemia   . Hypertension    Patient denies.  . IBS (irritable bowel syndrome)   . Lower back pain   . Neck pain   . Plantar fasciitis of left foot   . Plantar fasciitis of right foot   . Unspecified vitamin D deficiency    Allergies:  Allergies  Allergen Reactions  . Aspirin Other (See Comments)    GI upset  . Mobic [Meloxicam] Other (See Comments)    GI upset  . Wellbutrin [Bupropion] Other (See Comments)    headache  . Z-Pak [Azithromycin] Other (See Comments)    thrush  . Penicillins Rash     Review of Systems:  Review of Systems  Constitutional: Positive for malaise/fatigue. Negative for chills, diaphoresis, fever and weight loss.  HENT: Negative.   Eyes: Negative.   Respiratory: Negative.   Cardiovascular: Negative.   Gastrointestinal: Negative.   Genitourinary: Negative.   Musculoskeletal: Negative.   Skin: Negative.   Neurological: Negative.  Negative for weakness.  Endo/Heme/Allergies: Negative.   Psychiatric/Behavioral: Negative.     Family history- Review and  unchanged Social history- Review and unchanged Physical Exam: BP 112/76   Pulse 68   Temp 97.7 F (36.5 C)   Ht 5\' 4"  (1.626 m)   Wt 195 lb (88.5 kg)   LMP 05/15/2010   SpO2 97%   BMI 33.47 kg/m  Wt Readings from Last 3 Encounters:  03/13/17 195 lb (88.5 kg)  12/14/16 185 lb (83.9 kg)  10/18/16 186 lb (84.4 kg)   General Appearance: Well nourished, in no apparent distress. Eyes: PERRLA, EOMs, conjunctiva no swelling or erythema Sinuses: No Frontal/maxillary tenderness ENT/Mouth: Ext aud canals clear, TMs without erythema, bulging. No erythema, swelling, or exudate on post pharynx.  Tonsils not swollen or erythematous. Hearing normal.  Neck: Supple, thyroid  normal.  Respiratory: Respiratory effort normal, BS equal bilaterally without rales, rhonchi, wheezing or stridor.  Cardio: RRR with no MRGs. Brisk peripheral pulses without edema.  Abdomen: Soft, + BS,  Non tender, no guarding, rebound, hernias, masses. Lymphatics: Non tender without lymphadenopathy.  Musculoskeletal: Full ROM, 5/5 strength, Normal gait Skin: Warm, dry without rashes, lesions, ecchymosis.  Neuro: Cranial nerves intact. Normal muscle tone, no cerebellar symptoms. Psych: Awake and oriented X 3, normal affect, Insight and Judgment appropriate.    Vicie Mutters, PA-C 8:50 AM [facility]

## 2017-03-13 ENCOUNTER — Ambulatory Visit: Payer: BLUE CROSS/BLUE SHIELD | Admitting: Physician Assistant

## 2017-03-13 ENCOUNTER — Encounter: Payer: Self-pay | Admitting: Physician Assistant

## 2017-03-13 VITALS — BP 112/76 | HR 68 | Temp 97.7°F | Ht 64.0 in | Wt 195.0 lb

## 2017-03-13 DIAGNOSIS — E785 Hyperlipidemia, unspecified: Secondary | ICD-10-CM

## 2017-03-13 DIAGNOSIS — R030 Elevated blood-pressure reading, without diagnosis of hypertension: Secondary | ICD-10-CM | POA: Diagnosis not present

## 2017-03-13 DIAGNOSIS — R5383 Other fatigue: Secondary | ICD-10-CM

## 2017-03-13 DIAGNOSIS — R21 Rash and other nonspecific skin eruption: Secondary | ICD-10-CM

## 2017-03-13 DIAGNOSIS — Z6833 Body mass index (BMI) 33.0-33.9, adult: Secondary | ICD-10-CM | POA: Diagnosis not present

## 2017-03-13 MED ORDER — NYSTATIN 100000 UNIT/GM EX OINT: 1.0000 "application " | TOPICAL_OINTMENT | Freq: Two times a day (BID) | CUTANEOUS | 0 refills | Status: DC

## 2017-03-13 MED ORDER — TOPIRAMATE 25 MG PO TABS: 25.0000 mg | ORAL_TABLET | Freq: Every day | ORAL | 2 refills | Status: DC

## 2017-03-13 NOTE — Patient Instructions (Addendum)
Going to start you on topamax, start on 1/2 pill for 3-5 nights, can increase to a whole pill for 1-2 weeks, and can go up to 50mg  at night (2 pills). Let me know how you are doing with this.   Vitamin D goal is between 60-80  Please make sure that you are taking your Vitamin D as directed.   It is very important as a natural anti-inflammatory   helping hair, skin, and nails, as well as reducing stroke and heart attack risk.   It helps your bones and helps with mood.  We want you on at least 5000 IU daily  It also decreases numerous cancer risks so please take it as directed.   Low Vit D is associated with a 200-300% higher risk for CANCER   and 200-300% higher risk for HEART   ATTACK  &  STROKE.    .....................................Marland Kitchen  It is also associated with higher death rate at younger ages,   autoimmune diseases like Rheumatoid arthritis, Lupus, Multiple Sclerosis.     Also many other serious conditions, like depression, Alzheimer's  Dementia, infertility, muscle aches, fatigue, fibromyalgia - just to name a few.  +++++++++++++++++++  Can get liquid vitamin D from [facility] here in [place] at  [facility] alternatives [address], [place], [place] [address] Or you can try earth fare   Intermittent fasting is more about strategy than starvation. It's meant to reset your body in different ways, hopefully with fitness and nutrition changes as a result.  Like any big switchover, though, results may vary when it comes down to the individual level. What works for your friends may not work for you, or vice versa. That's why it's helpful to play around with variations on intermittent fasting and healthy habits and find what works best for you.  WHAT IS INTERMITTENT FASTING AND WHY DO IT?  Intermittent fasting doesn't involve specific foods, but rather, a strict schedule regarding when you eat. Also called "time-restricted eating," the tactic has been praised for its  contribution to weight loss, improved body composition, and decreased cravings. Preliminary research also suggests it may be beneficial for glucose tolerance, hormone regulation, better muscle mass and lower body fat.  Part of its appeal is the simplicity of the effort. Unlike some other trends, there's no calculations to intermittent fasting.  You simply eat within a certain block of time, usually a window of 8-10 hours. In the other big block of time - about 14-16 hours, including when you're asleep - you don't eat anything, not even snacks. You can drink water, coffee, tea or any other beverage that doesn't have calories.  For example, if you like having a late dinner, you might skip breakfast and have your first meal at noon and your last meal of the day at 8 p.m., and then not eat until noon again the next day.  IDEAS FOR GETTING STARTED  If you're new to the strategy, it may be helpful to eat within the typical circadian rhythm and keep eating within daylight hours. This can be especially beneficial if you're looking at intermittent fasting for weight-loss goals.  So first try only eating between 12pm to 8pm.  Outside of this time you may have water, black coffee, and hot tea. You may not eat it drink anything that has carbs, sugars, OR artificial sugars like diet soda.   Like any major eating and fitness shift, it can take time to find the perfect fit, so don't be afraid  to experiment with different options - including ditching intermittent fasting altogether if it's simply not for you. But if it is, you may be surprised by some of the benefits that come along with the strategy.  Are you an emotional eater? Do you eat more when you're feeling stressed? Do you eat when you're not hungry or when you're full? Do you eat to feel better (to calm and soothe yourself when you're sad, mad, bored, anxious, etc.)? Do you reward yourself with food? Do you regularly eat until you've stuffed  yourself? Does food make you feel safe? Do you feel like food is a friend? Do you feel powerless or out of control around food?  If you answered yes to some of these questions than it is likely that you are an emotional eater. This is normally a learned behavior and can take time to first recognize the signs and second BREAK THE HABIT. But here is more information and tips to help.   The difference between emotional hunger and physical hunger Emotional hunger can be powerful, so it's easy to mistake it for physical hunger. But there are clues you can look for to help you tell physical and emotional hunger apart.  Emotional hunger comes on suddenly. It hits you in an instant and feels overwhelming and urgent. Physical hunger, on the other hand, comes on more gradually. The urge to eat doesn't feel as dire or demand instant satisfaction (unless you haven't eaten for a very long time).  Emotional hunger craves specific comfort foods. When you're physically hungry, almost anything sounds good-including healthy stuff like vegetables. But emotional hunger craves junk food or sugary snacks that provide an instant rush. You feel like you need cheesecake or pizza, and nothing else will do.  Emotional hunger often leads to mindless eating. Before you know it, you've eaten a whole bag of chips or an entire pint of ice cream without really paying attention or fully enjoying it. When you're eating in response to physical hunger, you're typically more aware of what you're doing.  Emotional hunger isn't satisfied once you're full. You keep wanting more and more, often eating until you're uncomfortably stuffed. Physical hunger, on the other hand, doesn't need to be stuffed. You feel satisfied when your stomach is full.  Emotional hunger isn't located in the stomach. Rather than a growling belly or a pang in your stomach, you feel your hunger as a craving you can't get out of your head. You're focused on specific  textures, tastes, and smells.  Emotional hunger often leads to regret, guilt, or shame. When you eat to satisfy physical hunger, you're unlikely to feel guilty or ashamed because you're simply giving your body what it needs. If you feel guilty after you eat, it's likely because you know deep down that you're not eating for nutritional reasons.  Identify your emotional eating triggers What situations, places, or feelings make you reach for the comfort of food? Most emotional eating is linked to unpleasant feelings, but it can also be triggered by positive emotions, such as rewarding yourself for achieving a goal or celebrating a holiday or happy event. Common causes of emotional eating include:  Stuffing emotions - Eating can be a way to temporarily silence or "stuff down" uncomfortable emotions, including anger, fear, sadness, anxiety, loneliness, resentment, and shame. While you're numbing yourself with food, you can avoid the difficult emotions you'd rather not feel.  Boredom or feelings of emptiness - Do you ever eat simply to give  yourself something to do, to relieve boredom, or as a way to fill a void in your life? You feel unfulfilled and empty, and food is a way to occupy your mouth and your time. In the moment, it fills you up and distracts you from underlying feelings of purposelessness and dissatisfaction with your life.  Childhood habits - Think back to your childhood memories of food. Did your parents reward good behavior with ice cream, take you out for pizza when you got a good report card, or serve you sweets when you were feeling sad? These habits can often carry over into adulthood. Or your eating may be driven by nostalgia-for cherished memories of grilling burgers in the backyard with your dad or baking and eating cookies with your mom.  Social influences - Getting together with other people for a meal is a great way to relieve stress, but it can also lead to overeating. It's easy to  overindulge simply because the food is there or because everyone else is eating. You may also overeat in social situations out of nervousness. Or perhaps your family or circle of friends encourages you to overeat, and it's easier to go along with the group.  Stress - Ever notice how stress makes you hungry? It's not just in your mind. When stress is chronic, as it so often is in our chaotic, fast-paced world, your body produces high levels of the stress hormone, cortisol. Cortisol triggers cravings for salty, sweet, and fried foods-foods that give you a burst of energy and pleasure. The more uncontrolled stress in your life, the more likely you are to turn to food for emotional relief.  Find other ways to feed your feelings If you don't know how to manage your emotions in a way that doesn't involve food, you won't be able to control your eating habits for very long. Diets so often fail because they offer logical nutritional advice which only works if you have conscious control over your eating habits. It doesn't work when emotions hijack the process, demanding an immediate payoff with food.  In order to stop emotional eating, you have to find other ways to fulfill yourself emotionally. It's not enough to understand the cycle of emotional eating or even to understand your triggers, although that's a huge first step. You need alternatives to food that you can turn to for emotional fulfillment.  Alternatives to emotional eating If you're depressed or lonely, call someone who always makes you feel better, play with your dog or cat, or look at a favorite photo or cherished memento.  If you're anxious, expend your nervous energy by dancing to your favorite song, squeezing a stress ball, or taking a brisk walk.  If you're exhausted, treat yourself with a hot cup of tea, take a bath, light some scented candles, or wrap yourself in a warm blanket.  If you're bored, read a good book, watch a comedy show,  explore the outdoors, or turn to an activity you enjoy (woodworking, playing the guitar, shooting hoops, scrapbooking, etc.).  What is mindful eating? Mindful eating is a practice that develops your awareness of eating habits and allows you to pause between your triggers and your actions. Most emotional eaters feel powerless over their food cravings. When the urge to eat hits, you feel an almost unbearable tension that demands to be fed, right now. Because you've tried to resist in the past and failed, you believe that your willpower just isn't up to snuff. But the truth is  that you have more power over your cravings than you think.  Take 5 before you give in to a craving Emotional eating tends to be automatic and virtually mindless. Before you even realize what you're doing, you've reached for a tub of ice cream and polished off half of it. But if you can take a moment to pause and reflect when you're hit with a craving, you give yourself the opportunity to make a different decision.  Can you put off eating for five minutes? Or just start with one minute. Don't tell yourself you can't give in to the craving; remember, the forbidden is extremely tempting. Just tell yourself to wait.  While you're waiting, check in with yourself. How are you feeling? What's going on emotionally? Even if you end up eating, you'll have a better understanding of why you did it. This can help you set yourself up for a different response next time.  How to practice mindful eating Eating while you're also doing other things-such as watching TV, driving, or playing with your phone-can prevent you from fully enjoying your food. Since your mind is elsewhere, you may not feel satisfied or continue eating even though you're no longer hungry. Eating more mindfully can help focus your mind on your food and the pleasure of a meal and curb overeating.   Eat your meals in a calm place with no distractions, aside from any dining  companions.  Try eating with your non-dominant hand or using chopsticks instead of a knife and fork. Eating in such a non-familiar way can slow down how fast you eat and ensure your mind stays focused on your food.  Allow yourself enough time not to have to rush your meal. Set a timer for 20 minutes and pace yourself so you spend at least that much time eating.  Take small bites and chew them well, taking time to notice the different flavors and textures of each mouthful.  Put your utensils down between bites. Take time to consider how you feel-hungry, satiated-before picking up your utensils again.  Try to stop eating before you are full.It takes time for the signal to reach your brain that you've had enough. Don't feel obligated to always clean your plate.  When you've finished your food, take a few moments to assess if you're really still hungry before opting for an extra serving or dessert.  Learn to accept your feelings-even the bad ones  While it may seem that the core problem is that you're powerless over food, emotional eating actually stems from feeling powerless over your emotions. You don't feel capable of dealing with your feelings head on, so you avoid them with food.  Recommended reading  Mini Habits for weight loss  Healthy Eating: A guide to the new nutrition - [place] Report  10 Tips for Mindful Eating - How mindfulness can help you fully enjoy a meal and the experience of eating-with moderation and restraint. ([place])  Weight Loss: Gain Control of Emotional Eating - Tips to regain control of your eating habits. [facility])  Why Stress Causes People to Overeat -Tips on controlling stress eating. ([facility])  Mindful Eating Meditations -Free online mindfulness meditations. ([facility])    Veggies are great because you can eat a ton! They are low in calories, great to fill you up, and  have a ton of vitamins, minerals, and protein.         When it  comes to diets, agreement about the perfect plan isn't easy to find, even among the experts. Experts at the [employer] developed an idea known as the Healthy Eating Plate. Just imagine a plate divided into logical, healthy portions.  The emphasis is on diet quality:  Load up on vegetables and fruits - one-half of your plate: Aim for color and variety, and remember that potatoes don't count.  Go for whole grains - one-quarter of your plate: Whole wheat, barley, wheat berries, quinoa, oats, brown rice, and foods made with them. If you want pasta, go with whole wheat pasta.  Protein power - one-quarter of your plate: Fish, chicken, beans, and nuts are all healthy, versatile protein sources. Limit red meat.  The diet, however, does go beyond the plate, offering a few other suggestions.  Use healthy plant oils, such as olive, canola, soy, corn, sunflower and peanut. Check the labels, and avoid partially hydrogenated oil, which have unhealthy trans fats.  If you're thirsty, drink water. Coffee and tea are good in moderation, but skip sugary drinks and limit milk and dairy products to one or two daily servings.  The type of carbohydrate in the diet is more important than the amount. Some sources of carbohydrates, such as vegetables, fruits, whole grains, and beans-are healthier than others.  Finally, stay active.  Nuts are a healthy fat that can help you feel full HOWEVER they pack a lot of calories in a small amount. For weight loss, I suggest no more than 1 serving of nuts a day. A handful goes a long way. Here are some examples of the different types of nuts and the suggested serving a day.    8 Critical Weight-Loss Tips That Aren't Diet and Exercise  1. STARVE THE DISTRACTIONS  All too often when we eat, we're also multitasking: watching TV, answering emails, scrolling through social media. These  habits are detrimental to having a strong, clear, healthy relationship with food, and they can hinder our ability to make dietary changes.  In order to truly focus on what you're eating, how much you're eating, why you're eating those specific foods and, most importantly, how those foods make you feel, you need to starve the distractions. That means when you eat, just eat. Focus on your food, the process it went through to end up on your plate, where it came from and how it nourishes you. With this technique, you're more likely to finish a meal feeling satiated.  2.  CONSIDER WHAT YOU'RE NOT WILLING TO DO  This might sound counterintuitive, but it can help provide a "why" when motivation is waning. Declare, in writing, what you are unwilling to do, for example "I am unwilling to be the old dad who cannot play sports with my children".  So consider what you're not willing to accept, write it down, and keep it at the ready.  3.  STOP LABELING FOOD "GOOD" AND "BAD"  You've probably heard someone say they ate something "bad." Maybe you've even said it yourself.  The trouble with 'bad' foods isn't that they'll send you to the grave after a bite or two. The trouble comes when we eat excessive portions of really calorie-dense foods meal after meal, day after day.  Instead of labeling foods as good or bad, think about which foods you can eat a lot of, and which ones you should just eat a little of. Then, plan ways to eat the foods you really like in portions that  fit with your overall goals. A good example of this would be having a slice of pizza alongside a club salad with chicken breast, avocado and a bit of dressing. This is vastly different than 3 slices of pizza, 4 breadsticks with cheese sauce and half of a liter of regular soda.  4.  BRUSH YOUR TEETH AFTER YOU EAT  Getting your mindset in order is important, but sometimes small habits can make a big difference. After eating, you still have the  taste of food in their mouth, which often causes people to eat more even if they are full or engage in a nibble or two of dessert.  Brushing your teeth will remove the taste of food from your mouth, and the clean, minty freshness will serve as a cue that mealtime is over.  5.  FOCUS ON CROWDING NOT CUTTING  The most common first step during 'dieting' is to cut. We cut our portion sizes down, we cut out 'bad' foods, we cut out entire food groups. This act of cutting puts [place] and our minds into scarcity mode.  When something is off-limits, even if you're able to avoid it for a while, you could end up bingeing on it later because you've gone so long without it. So, instead of cutting, focus on crowding. If you crowd your plate and fill it up with more foods like veggies and protein, it simply allows less room for the other stuff. In other words, shift your focus away from what you can't eat, and celebrate the foods that will help you reach your goals.  6.  TAKE TRACKING A STEP FURTHER  Track what you eat, when you ate it, how much you ate and how that food made you feel. Being completely honest with yourself and writing down every single thing that passes through your lips will help you start to notice that maybe you actually do snack, possibly take in more sugar than you thought, eat when you're bored rather than just hungry or maybe that you have a habit of snacking before bed while watching TV.  The difference from simply tracking your food intake is you're taking into account how food makes you feel, as well as what you're doing while you're eating. This is about becoming more mindful of what, when and why you eat.  7.  PRIORITIZE GOOD SLEEP  One of the strongest risk factors for being overweight is poor sleep. When you're feeling tired, you're more likely to choose unhealthy comfort foods and to skip your workout. Additionally, sleep deprivation may slow down your metabolism. [place]! Therefore,  sleeping 7-8 hours per night can help with weight loss without having to change your diet or increase your physical activity. And if you feel you snore and still wake up tired, talk with me about sleep apnea.  8.  SET ASIDE TIME TO DISCONNECT  Just get out there. Disconnect from the electronics and connect to the elements. Not only will this help reduce stress (a major factor in weight gain) by giving your mind a break from the constant stimulation we've all become so accustomed to, but it may also reprogram your brain to connect with yourself and what you're feeling.

## 2017-03-14 ENCOUNTER — Other Ambulatory Visit: Payer: Self-pay | Admitting: *Deleted

## 2017-03-14 LAB — LIPID PANEL
CHOLESTEROL: 210 mg/dL — AB (ref <200)
HDL: 67 mg/dL (ref >50)
LDL CHOLESTEROL (CALC): 114 mg/dL — AB
Non-HDL Cholesterol (Calc): 143 mg/dL (calc) — ABNORMAL HIGH (ref <130)
Total CHOL/HDL Ratio: 3.1 (calc) (ref <5.0)
Triglycerides: 170 mg/dL — ABNORMAL HIGH (ref <150)

## 2017-03-14 LAB — CBC WITH DIFFERENTIAL/PLATELET
BASOS PCT: 0.8 %
Basophils Absolute: 82 cells/uL (ref 0–200)
EOS PCT: 1.7 %
Eosinophils Absolute: 175 cells/uL (ref 15–500)
HCT: 40.3 % (ref 35.0–45.0)
HEMOGLOBIN: 13.2 g/dL (ref 11.7–15.5)
Lymphs Abs: 3409 cells/uL (ref 850–3900)
MCH: 25.9 pg — ABNORMAL LOW (ref 27.0–33.0)
MCHC: 32.8 g/dL (ref 32.0–36.0)
MCV: 79 fL — ABNORMAL LOW (ref 80.0–100.0)
MONOS PCT: 6.7 %
MPV: 10.3 fL (ref 7.5–12.5)
NEUTROS ABS: 5943 {cells}/uL (ref 1500–7800)
Neutrophils Relative %: 57.7 %
PLATELETS: 294 10*3/uL (ref 140–400)
RBC: 5.1 10*6/uL (ref 3.80–5.10)
RDW: 12.4 % (ref 11.0–15.0)
TOTAL LYMPHOCYTE: 33.1 %
WBC mixed population: 690 cells/uL (ref 200–950)
WBC: 10.3 10*3/uL (ref 3.8–10.8)

## 2017-03-14 LAB — BASIC METABOLIC PANEL WITH GFR
BUN: 13 mg/dL (ref 7–25)
CALCIUM: 10 mg/dL (ref 8.6–10.4)
CHLORIDE: 104 mmol/L (ref 98–110)
CO2: 27 mmol/L (ref 20–32)
Creat: 0.56 mg/dL (ref 0.50–1.05)
GFR, EST AFRICAN AMERICAN: 120 mL/min/{1.73_m2} (ref >=60)
GFR, EST NON AFRICAN AMERICAN: 104 mL/min/{1.73_m2} (ref >=60)
Glucose, Bld: 95 mg/dL (ref 65–99)
POTASSIUM: 4.7 mmol/L (ref 3.5–5.3)
SODIUM: 138 mmol/L (ref 135–146)

## 2017-03-14 LAB — HEPATIC FUNCTION PANEL
AG Ratio: 1.8 (calc) (ref 1.0–2.5)
ALBUMIN MSPROF: 4.5 g/dL (ref 3.6–5.1)
ALKALINE PHOSPHATASE (APISO): 89 U/L (ref 33–130)
ALT: 10 U/L (ref 6–29)
AST: 17 U/L (ref 10–35)
BILIRUBIN DIRECT: 0.1 mg/dL (ref 0.0–0.2)
BILIRUBIN INDIRECT: 0.2 mg/dL (ref 0.2–1.2)
BILIRUBIN TOTAL: 0.3 mg/dL (ref 0.2–1.2)
Globulin: 2.5 g/dL (calc) (ref 1.9–3.7)
Total Protein: 7 g/dL (ref 6.1–8.1)

## 2017-03-14 LAB — VITAMIN B12: VITAMIN B 12: 268 pg/mL (ref 200–1100)

## 2017-03-14 LAB — TSH: TSH: 2.03 m[IU]/L (ref 0.40–4.50)

## 2017-03-14 LAB — IRON, TOTAL/TOTAL IRON BINDING CAP
%SAT: 13 % (calc) (ref 11–50)
IRON: 50 ug/dL (ref 45–160)
TIBC: 377 mcg/dL (calc) (ref 250–450)

## 2017-03-14 LAB — INSULIN, RANDOM: INSULIN: 6.5 u[IU]/mL (ref 2.0–19.6)

## 2017-03-14 MED ORDER — ACYCLOVIR 800 MG PO TABS: 800.0000 mg | ORAL_TABLET | Freq: Every day | ORAL | 1 refills | Status: DC | PRN

## 2017-03-17 ENCOUNTER — Encounter: Payer: Self-pay | Admitting: Physician Assistant

## 2017-03-21 ENCOUNTER — Encounter: Payer: Self-pay | Admitting: Physician Assistant

## 2017-03-21 ENCOUNTER — Other Ambulatory Visit: Payer: Self-pay | Admitting: Physician Assistant

## 2017-04-16 NOTE — Progress Notes (Signed)
Assessment and Plan:   BMI 33.0-33.9,adult with HTN/Chol/insomnia -     traZODone (DESYREL) 50 MG tablet; 1/2-1 tablet for sleep - she has failed phentermine, topamax, naltrexone and could not afford belviq - she will continue to keep food journal - she will either work on better breakfast not just sugary coffee OR will try time restricted eating- info give to patient.  - We will work on sleep and try to help this to increase weight loss - close follow up 4 weeks.    HPI 57 y.o.female presents for depression and weight gain.  She has failed phentermine in the past, we added B12 and tried topamax but she was complaining of worsening crying spells. She was started on naltrexone but states she is having AM headaches. She has a history of She is here for follow up.   She has not been helpful with the meds, she is not sleeping as well. She is drinking a 300 caloir, 30 gram sugar coffee for breakfast only, does a great lunch but bad snacks. She admits to emotional hunger.   BMI is Body mass index is 33.57 kg/m., she is working on diet and exercise. Wt Readings from Last 3 Encounters:  04/18/17 195 lb 9.6 oz (88.7 kg)  03/13/17 195 lb (88.5 kg)  12/14/16 185 lb (83.9 kg)     Past Medical History:  Diagnosis Date  . Anal fissure   . DDD (degenerative disc disease), cervical   . DDD (degenerative disc disease), lumbar   . Hyperlipidemia   . Hypertension    Patient denies.  . IBS (irritable bowel syndrome)   . Lower back pain   . Neck pain   . Plantar fasciitis of left foot   . Plantar fasciitis of right foot   . Unspecified vitamin D deficiency      Allergies  Allergen Reactions  . Aspirin Other (See Comments)    GI upset  . Mobic [Meloxicam] Other (See Comments)    GI upset  . Wellbutrin [Bupropion] Other (See Comments)    headache  . Z-Pak [Azithromycin] Other (See Comments)    thrush  . Penicillins Rash    Current Outpatient Medications on File Prior to Visit    Medication Sig  . acyclovir (ZOVIRAX) 800 MG tablet Take 1 tablet (800 mg total) by mouth daily as needed.  . clobetasol ointment (TEMOVATE) 5.39 % Apply 1 application topically 2 (two) times daily as needed.  . meclizine (ANTIVERT) 25 MG tablet Take 25 mg by mouth 3 (three) times daily as needed for dizziness.  . metroNIDAZOLE (METROCREAM) 0.75 % cream Apply topically 2 (two) times daily.  Marland Kitchen nystatin ointment (MYCOSTATIN) Apply 1 application topically 2 (two) times daily.  Marland Kitchen PROCTOSOL HC 2.5 % rectal cream PLACE 1 APPLICATION RECTALLY 2 (TWO) TIMES DAILY.  Marland Kitchen diltiazem 2 % GEL Apply 1 application topically 2 (two) times daily.   No current facility-administered medications on file prior to visit.     ROS: all negative except above.   Physical Exam: Filed Weights   04/18/17 0837  Weight: 195 lb 9.6 oz (88.7 kg)   BP 134/70   Pulse 93   Temp 97.7 F (36.5 C)   Resp 16   Ht 5\' 4"  (1.626 m)   Wt 195 lb 9.6 oz (88.7 kg)   LMP 05/15/2010   SpO2 97%   BMI 33.57 kg/m  General Appearance: Well nourished, in no apparent distress. Eyes: PERRLA, EOMs, conjunctiva no swelling or erythema  Sinuses: No Frontal/maxillary tenderness ENT/Mouth: Ext aud canals clear, TMs without erythema, bulging. No erythema, swelling, or exudate on post pharynx.  Tonsils not swollen or erythematous. Hearing normal.  Neck: Supple, thyroid normal.  Respiratory: Respiratory effort normal, BS equal bilaterally without rales, rhonchi, wheezing or stridor.  Cardio: RRR with no MRGs. Brisk peripheral pulses without edema.  Abdomen: Soft, + BS.  Non tender, no guarding, rebound, hernias, masses. Lymphatics: Non tender without lymphadenopathy.  Musculoskeletal: Full ROM, 5/5 strength, normal gait.  Skin: Warm, dry without rashes, lesions, ecchymosis.  Neuro: Cranial nerves intact. Normal muscle tone, no cerebellar symptoms. Sensation intact.  Psych: Awake and oriented X 3, normal affect, Insight and Judgment  appropriate.     Vicie Mutters, PA-C 9:14 AM [facility]

## 2017-04-18 ENCOUNTER — Encounter: Payer: Self-pay | Admitting: Physician Assistant

## 2017-04-18 ENCOUNTER — Ambulatory Visit (INDEPENDENT_AMBULATORY_CARE_PROVIDER_SITE_OTHER): Payer: BLUE CROSS/BLUE SHIELD | Admitting: Physician Assistant

## 2017-04-18 VITALS — BP 134/70 | HR 93 | Temp 97.7°F | Resp 16 | Ht 64.0 in | Wt 195.6 lb

## 2017-04-18 DIAGNOSIS — E785 Hyperlipidemia, unspecified: Secondary | ICD-10-CM | POA: Diagnosis not present

## 2017-04-18 DIAGNOSIS — R030 Elevated blood-pressure reading, without diagnosis of hypertension: Secondary | ICD-10-CM | POA: Diagnosis not present

## 2017-04-18 DIAGNOSIS — Z6833 Body mass index (BMI) 33.0-33.9, adult: Secondary | ICD-10-CM

## 2017-04-18 MED ORDER — TRAZODONE HCL 50 MG PO TABS: ORAL_TABLET | ORAL | 2 refills | Status: DC

## 2017-04-18 NOTE — Patient Instructions (Addendum)
Check out  Mini habits for weight loss book  2 apps for tracking food is myfitness pal  loseit OR can take picture of your food  We need to either fix your breakfast like examples below OR you can try intermittent fasting  Being dehydrated can hurt your kidneys, cause fatigue, headaches, muscle aches, joint pain, and dry skin/nails so please increase your fluids.   Drink 80-100 oz a day of water, measure it out! Eat 3 meals a day, have to do breakfast, eat protein- hard boiled eggs, protein bar like [place] protein bar, greek yogurt like oikos triple zero, chobani 100, or light n fit greek  Try trazodone 50mg  1/2 pill at night, can go up to 1 pill at night total If this does not help we can try requip  Intermittent fasting is more about strategy than starvation. It's meant to reset your body in different ways, hopefully with fitness and nutrition changes as a result.  Like any big switchover, though, results may vary when it comes down to the individual level. What works for your friends may not work for you, or vice versa. That's why it's helpful to play around with variations on intermittent fasting and healthy habits and find what works best for you.  WHAT IS INTERMITTENT FASTING AND WHY DO IT?  Intermittent fasting doesn't involve specific foods, but rather, a strict schedule regarding when you eat. Also called "time-restricted eating," the tactic has been praised for its contribution to weight loss, improved body composition, and decreased cravings. Preliminary research also suggests it may be beneficial for glucose tolerance, hormone regulation, better muscle mass and lower body fat.  Part of its appeal is the simplicity of the effort. Unlike some other trends, there's no calculations to intermittent fasting.  You simply eat within a certain block of time, usually a window of 8-10 hours. In the other big block of time - about 14-16 hours, including when you're asleep - you  don't eat anything, not even snacks. You can drink water, coffee, tea or any other beverage that doesn't have calories.  For example, if you like having a late dinner, you might skip breakfast and have your first meal at noon and your last meal of the day at 8 p.m., and then not eat until noon again the next day.  IDEAS FOR GETTING STARTED  If you're new to the strategy, it may be helpful to eat within the typical circadian rhythm and keep eating within daylight hours. This can be especially beneficial if you're looking at intermittent fasting for weight-loss goals.  So first try only eating between 12pm to 8pm.  Outside of this time you may have water, black coffee, and hot tea. You may not eat it drink anything that has carbs, sugars, OR artificial sugars like diet soda.   Like any major eating and fitness shift, it can take time to find the perfect fit, so don't be afraid to experiment with different options - including ditching intermittent fasting altogether if it's simply not for you. But if it is, you may be surprised by some of the benefits that come along with the strategy.   8 Critical Weight-Loss Tips That Aren't Diet and Exercise  1. STARVE THE DISTRACTIONS  All too often when we eat, we're also multitasking: watching TV, answering emails, scrolling through social media. These habits are detrimental to having a strong, clear, healthy relationship with food, and they can hinder our ability to make dietary changes.  In  order to truly focus on what you're eating, how much you're eating, why you're eating those specific foods and, most importantly, how those foods make you feel, you need to starve the distractions. That means when you eat, just eat. Focus on your food, the process it went through to end up on your plate, where it came from and how it nourishes you. With this technique, you're more likely to finish a meal feeling satiated.  2.  CONSIDER WHAT YOU'RE NOT WILLING TO  DO  This might sound counterintuitive, but it can help provide a "why" when motivation is waning. Declare, in writing, what you are unwilling to do, for example "I am unwilling to be the old dad who cannot play sports with my children".  So consider what you're not willing to accept, write it down, and keep it at the ready.  3.  STOP LABELING FOOD "GOOD" AND "BAD"  You've probably heard someone say they ate something "bad." Maybe you've even said it yourself.  The trouble with 'bad' foods isn't that they'll send you to the grave after a bite or two. The trouble comes when we eat excessive portions of really calorie-dense foods meal after meal, day after day.  Instead of labeling foods as good or bad, think about which foods you can eat a lot of, and which ones you should just eat a little of. Then, plan ways to eat the foods you really like in portions that fit with your overall goals. A good example of this would be having a slice of pizza alongside a club salad with chicken breast, avocado and a bit of dressing. This is vastly different than 3 slices of pizza, 4 breadsticks with cheese sauce and half of a liter of regular soda.  4.  BRUSH YOUR TEETH AFTER YOU EAT  Getting your mindset in order is important, but sometimes small habits can make a big difference. After eating, you still have the taste of food in their mouth, which often causes people to eat more even if they are full or engage in a nibble or two of dessert.  Brushing your teeth will remove the taste of food from your mouth, and the clean, minty freshness will serve as a cue that mealtime is over.  5.  FOCUS ON CROWDING NOT CUTTING  The most common first step during 'dieting' is to cut. We cut our portion sizes down, we cut out 'bad' foods, we cut out entire food groups. This act of cutting puts [place] and our minds into scarcity mode.  When something is off-limits, even if you're able to avoid it for a while, you could end up  bingeing on it later because you've gone so long without it. So, instead of cutting, focus on crowding. If you crowd your plate and fill it up with more foods like veggies and protein, it simply allows less room for the other stuff. In other words, shift your focus away from what you can't eat, and celebrate the foods that will help you reach your goals.  6.  TAKE TRACKING A STEP FURTHER  Track what you eat, when you ate it, how much you ate and how that food made you feel. Being completely honest with yourself and writing down every single thing that passes through your lips will help you start to notice that maybe you actually do snack, possibly take in more sugar than you thought, eat when you're bored rather than just hungry or maybe that you have  a habit of snacking before bed while watching TV.  The difference from simply tracking your food intake is you're taking into account how food makes you feel, as well as what you're doing while you're eating. This is about becoming more mindful of what, when and why you eat.  7.  PRIORITIZE GOOD SLEEP  One of the strongest risk factors for being overweight is poor sleep. When you're feeling tired, you're more likely to choose unhealthy comfort foods and to skip your workout. Additionally, sleep deprivation may slow down your metabolism. [employer]! Therefore, sleeping 7-8 hours per night can help with weight loss without having to change your diet or increase your physical activity. And if you feel you snore and still wake up tired, talk with me about sleep apnea.  8.  SET ASIDE TIME TO DISCONNECT  Just get out there. Disconnect from the electronics and connect to the elements. Not only will this help reduce stress (a major factor in weight gain) by giving your mind a break from the constant stimulation we've all become so accustomed to, but it may also reprogram your brain to connect with yourself and what you're feeling.   Are you an emotional eater? Do  you eat more when you're feeling stressed? Do you eat when you're not hungry or when you're full? Do you eat to feel better (to calm and soothe yourself when you're sad, mad, bored, anxious, etc.)? Do you reward yourself with food? Do you regularly eat until you've stuffed yourself? Does food make you feel safe? Do you feel like food is a friend? Do you feel powerless or out of control around food?  If you answered yes to some of these questions than it is likely that you are an emotional eater. This is normally a learned behavior and can take time to first recognize the signs and second BREAK THE HABIT. But here is more information and tips to help.   The difference between emotional hunger and physical hunger Emotional hunger can be powerful, so it's easy to mistake it for physical hunger. But there are clues you can look for to help you tell physical and emotional hunger apart.  Emotional hunger comes on suddenly. It hits you in an instant and feels overwhelming and urgent. Physical hunger, on the other hand, comes on more gradually. The urge to eat doesn't feel as dire or demand instant satisfaction (unless you haven't eaten for a very long time).  Emotional hunger craves specific comfort foods. When you're physically hungry, almost anything sounds good-including healthy stuff like vegetables. But emotional hunger craves junk food or sugary snacks that provide an instant rush. You feel like you need cheesecake or pizza, and nothing else will do.  Emotional hunger often leads to mindless eating. Before you know it, you've eaten a whole bag of chips or an entire pint of ice cream without really paying attention or fully enjoying it. When you're eating in response to physical hunger, you're typically more aware of what you're doing.  Emotional hunger isn't satisfied once you're full. You keep wanting more and more, often eating until you're uncomfortably stuffed. Physical hunger, on the other  hand, doesn't need to be stuffed. You feel satisfied when your stomach is full.  Emotional hunger isn't located in the stomach. Rather than a growling belly or a pang in your stomach, you feel your hunger as a craving you can't get out of your head. You're focused on specific textures, tastes, and smells.  Emotional  hunger often leads to regret, guilt, or shame. When you eat to satisfy physical hunger, you're unlikely to feel guilty or ashamed because you're simply giving your body what it needs. If you feel guilty after you eat, it's likely because you know deep down that you're not eating for nutritional reasons.  Identify your emotional eating triggers What situations, places, or feelings make you reach for the comfort of food? Most emotional eating is linked to unpleasant feelings, but it can also be triggered by positive emotions, such as rewarding yourself for achieving a goal or celebrating a holiday or happy event. Common causes of emotional eating include:  Stuffing emotions - Eating can be a way to temporarily silence or "stuff down" uncomfortable emotions, including anger, fear, sadness, anxiety, loneliness, resentment, and shame. While you're numbing yourself with food, you can avoid the difficult emotions you'd rather not feel.  Boredom or feelings of emptiness - Do you ever eat simply to give yourself something to do, to relieve boredom, or as a way to fill a void in your life? You feel unfulfilled and empty, and food is a way to occupy your mouth and your time. In the moment, it fills you up and distracts you from underlying feelings of purposelessness and dissatisfaction with your life.  Childhood habits - Think back to your childhood memories of food. Did your parents reward good behavior with ice cream, take you out for pizza when you got a good report card, or serve you sweets when you were feeling sad? These habits can often carry over into adulthood. Or your eating may be driven by  nostalgia-for cherished memories of grilling burgers in the backyard with your dad or baking and eating cookies with your mom.  Social influences - Getting together with other people for a meal is a great way to relieve stress, but it can also lead to overeating. It's easy to overindulge simply because the food is there or because everyone else is eating. You may also overeat in social situations out of nervousness. Or perhaps your family or circle of friends encourages you to overeat, and it's easier to go along with the group.  Stress - Ever notice how stress makes you hungry? It's not just in your mind. When stress is chronic, as it so often is in our chaotic, fast-paced world, your body produces high levels of the stress hormone, cortisol. Cortisol triggers cravings for salty, sweet, and fried foods-foods that give you a burst of energy and pleasure. The more uncontrolled stress in your life, the more likely you are to turn to food for emotional relief.  Find other ways to feed your feelings If you don't know how to manage your emotions in a way that doesn't involve food, you won't be able to control your eating habits for very long. Diets so often fail because they offer logical nutritional advice which only works if you have conscious control over your eating habits. It doesn't work when emotions hijack the process, demanding an immediate payoff with food.  In order to stop emotional eating, you have to find other ways to fulfill yourself emotionally. It's not enough to understand the cycle of emotional eating or even to understand your triggers, although that's a huge first step. You need alternatives to food that you can turn to for emotional fulfillment.  Alternatives to emotional eating If you're depressed or lonely, call someone who always makes you feel better, play with your dog or cat, or look at a  favorite photo or cherished memento.  If you're anxious, expend your nervous energy by  dancing to your favorite song, squeezing a stress ball, or taking a brisk walk.  If you're exhausted, treat yourself with a hot cup of tea, take a bath, light some scented candles, or wrap yourself in a warm blanket.  If you're bored, read a good book, watch a comedy show, explore the outdoors, or turn to an activity you enjoy (woodworking, playing the guitar, shooting hoops, scrapbooking, etc.).  What is mindful eating? Mindful eating is a practice that develops your awareness of eating habits and allows you to pause between your triggers and your actions. Most emotional eaters feel powerless over their food cravings. When the urge to eat hits, you feel an almost unbearable tension that demands to be fed, right now. Because you've tried to resist in the past and failed, you believe that your willpower just isn't up to snuff. But the truth is that you have more power over your cravings than you think.  Take 5 before you give in to a craving Emotional eating tends to be automatic and virtually mindless. Before you even realize what you're doing, you've reached for a tub of ice cream and polished off half of it. But if you can take a moment to pause and reflect when you're hit with a craving, you give yourself the opportunity to make a different decision.  Can you put off eating for five minutes? Or just start with one minute. Don't tell yourself you can't give in to the craving; remember, the forbidden is extremely tempting. Just tell yourself to wait.  While you're waiting, check in with yourself. How are you feeling? What's going on emotionally? Even if you end up eating, you'll have a better understanding of why you did it. This can help you set yourself up for a different response next time.  How to practice mindful eating Eating while you're also doing other things-such as watching TV, driving, or playing with your phone-can prevent you from fully enjoying your food. Since your mind is  elsewhere, you may not feel satisfied or continue eating even though you're no longer hungry. Eating more mindfully can help focus your mind on your food and the pleasure of a meal and curb overeating.   Eat your meals in a calm place with no distractions, aside from any dining companions.  Try eating with your non-dominant hand or using chopsticks instead of a knife and fork. Eating in such a non-familiar way can slow down how fast you eat and ensure your mind stays focused on your food.  Allow yourself enough time not to have to rush your meal. Set a timer for 20 minutes and pace yourself so you spend at least that much time eating.  Take small bites and chew them well, taking time to notice the different flavors and textures of each mouthful.  Put your utensils down between bites. Take time to consider how you feel-hungry, satiated-before picking up your utensils again.  Try to stop eating before you are full.It takes time for the signal to reach your brain that you've had enough. Don't feel obligated to always clean your plate.  When you've finished your food, take a few moments to assess if you're really still hungry before opting for an extra serving or dessert.  Learn to accept your feelings-even the bad ones  While it may seem that the core problem is that you're powerless over food, emotional eating actually  stems from feeling powerless over your emotions. You don't feel capable of dealing with your feelings head on, so you avoid them with food.  Recommended reading  Mini Habits for weight loss  Healthy Eating: A guide to the new nutrition - [place] Report  10 Tips for Mindful Eating - How mindfulness can help you fully enjoy a meal and the experience of eating-with moderation and restraint. ([place])  Weight Loss: Gain Control of Emotional Eating - Tips to regain control of your eating habits. [facility])  Why Stress Causes People  to Overeat -Tips on controlling stress eating. ([place])  Mindful Eating Meditations -Free online mindfulness meditations. ([facility])

## 2017-04-19 ENCOUNTER — Other Ambulatory Visit: Payer: Self-pay | Admitting: Physician Assistant

## 2017-04-19 ENCOUNTER — Encounter: Payer: Self-pay | Admitting: Physician Assistant

## 2017-04-19 MED ORDER — PHENTERMINE HCL 37.5 MG PO TABS: 37.5000 mg | ORAL_TABLET | Freq: Every day | ORAL | 2 refills | Status: DC

## 2017-04-24 DIAGNOSIS — Z6833 Body mass index (BMI) 33.0-33.9, adult: Secondary | ICD-10-CM | POA: Diagnosis not present

## 2017-04-24 DIAGNOSIS — M4722 Other spondylosis with radiculopathy, cervical region: Secondary | ICD-10-CM | POA: Diagnosis not present

## 2017-04-24 DIAGNOSIS — M5412 Radiculopathy, cervical region: Secondary | ICD-10-CM | POA: Diagnosis not present

## 2017-04-25 ENCOUNTER — Ambulatory Visit: Payer: Self-pay | Admitting: Physician Assistant

## 2017-05-20 DIAGNOSIS — Z6831 Body mass index (BMI) 31.0-31.9, adult: Secondary | ICD-10-CM | POA: Insufficient documentation

## 2017-05-20 DIAGNOSIS — E669 Obesity, unspecified: Secondary | ICD-10-CM | POA: Insufficient documentation

## 2017-05-20 DIAGNOSIS — G4700 Insomnia, unspecified: Secondary | ICD-10-CM | POA: Insufficient documentation

## 2017-05-20 NOTE — Progress Notes (Signed)
Assessment and Plan:  Monica [name] was seen today for follow-up.  Diagnoses and all orders for this visit:  BMI 33.0-33.9,adult Long discussion about weight loss, diet, and exercise Recommended diet heavy in fruits and veggies, beans, nuts and low in animal meats, cheeses, and dairy products, appropriate calorie intake Discussed not cutting calories too aggressively, incorporate some non-fasting days - aim to lose 1 lb a week, might be losing weight too fast Follow up as scheduled  Insomnia, unspecified type Insomnia- good sleep hygiene discussed, increase day time activity, try melatonin or benadryl as well Increase trazodone to 50 mg   Further disposition pending results of labs. Discussed med's effects and SE's.   Over 15 minutes of exam, counseling, chart review, and critical decision making was performed.   Future Appointments  Date Time Provider [place]  10/24/2017  9:00 AM Vicie Mutters, PA-C [id number] None    ------------------------------------------------------------------------------------------------------------------   HPI BP 108/64   Pulse 65   Temp 97.9 F (36.6 C)   Ht 5\' 4"  (1.626 m)   Wt 186 lb (84.4 kg)   LMP 05/15/2010   SpO2 97%   BMI 31.93 kg/m   57 y.o.female presents for close 4 week follow up for monitoring of weight loss and insomnia. She has failed phentermine, topamax in the past, and most recently was initiated on trazodone 50 mg -with slow taper up from 25 mg to 50 mg. She reports she has only been taking 25 mg daily, reports this has been helpful about half of the time. She is willing to increase up to 50 mg.  BMI is Body mass index is 31.93 kg/m., she has been working on diet and exercise. She reports she has been working on intermittent fasting,  Eats between noon and 8 PM, typically has a protein and vegetables for lunch, then variable dinner. She reports she drinks 64+ fluid ounces of water daily.  Wt Readings from Last 3  Encounters:  05/21/17 186 lb (84.4 kg)  04/18/17 195 lb 9.6 oz (88.7 kg)  03/13/17 195 lb (88.5 kg)     Past Medical History:  Diagnosis Date  . Anal fissure   . DDD (degenerative disc disease), cervical   . DDD (degenerative disc disease), lumbar   . Hyperlipidemia   . Hypertension    Patient denies.  . IBS (irritable bowel syndrome)   . Lower back pain   . Neck pain   . Plantar fasciitis of left foot   . Plantar fasciitis of right foot   . Unspecified vitamin D deficiency      Allergies  Allergen Reactions  . Aspirin Other (See Comments)    GI upset  . Mobic [Meloxicam] Other (See Comments)    GI upset  . Wellbutrin [Bupropion] Other (See Comments)    headache  . Z-Pak [Azithromycin] Other (See Comments)    thrush  . Penicillins Rash    Current Outpatient Medications on File Prior to Visit  Medication Sig  . acyclovir (ZOVIRAX) 800 MG tablet Take 1 tablet (800 mg total) by mouth daily as needed.  . clobetasol ointment (TEMOVATE) 7.65 % Apply 1 application topically 2 (two) times daily as needed.  . meclizine (ANTIVERT) 25 MG tablet Take 25 mg by mouth 3 (three) times daily as needed for dizziness.  . metroNIDAZOLE (METROCREAM) 0.75 % cream Apply topically 2 (two) times daily.  . phentermine (ADIPEX-P) 37.5 MG tablet Take 1 tablet (37.5 mg total) by mouth daily before breakfast.  . traZODone (DESYREL) 50  MG tablet 1/2-1 tablet for sleep  . diltiazem 2 % GEL Apply 1 application topically 2 (two) times daily.  Marland Kitchen nystatin ointment (MYCOSTATIN) Apply 1 application topically 2 (two) times daily. (Patient not taking: Reported on 05/21/2017)  . PROCTOSOL HC 2.5 % rectal cream PLACE 1 APPLICATION RECTALLY 2 (TWO) TIMES DAILY. (Patient not taking: Reported on 05/21/2017)   No current facility-administered medications on file prior to visit.     ROS: all negative except above.   Physical Exam:  BP 108/64   Pulse 65   Temp 97.9 F (36.6 C)   Ht 5\' 4"  (1.626 m)   Wt 186  lb (84.4 kg)   LMP 05/15/2010   SpO2 97%   BMI 31.93 kg/m   General Appearance: Well nourished, in no apparent distress. Eyes: PERRLA, EOMs, conjunctiva no swelling or erythema Neck: Supple, thyroid normal.  Respiratory: Respiratory effort normal, BS equal bilaterally without rales, rhonchi, wheezing or stridor.  Cardio: RRR with no MRGs. Brisk peripheral pulses without edema.  Abdomen: Soft, + BS.  Non tender Lymphatics: Non tender without lymphadenopathy.  Musculoskeletal: normal gait.  Skin: Warm, dry without rashes, lesions, ecchymosis.  Psych: Awake and oriented X 3, normal affect, Insight and Judgment appropriate.    Izora Ribas, NP 8:56 AM [facility]

## 2017-05-21 ENCOUNTER — Ambulatory Visit (INDEPENDENT_AMBULATORY_CARE_PROVIDER_SITE_OTHER): Payer: BLUE CROSS/BLUE SHIELD | Admitting: Adult Health

## 2017-05-21 ENCOUNTER — Encounter: Payer: Self-pay | Admitting: Adult Health

## 2017-05-21 DIAGNOSIS — G4700 Insomnia, unspecified: Secondary | ICD-10-CM

## 2017-05-21 DIAGNOSIS — Z6833 Body mass index (BMI) 33.0-33.9, adult: Secondary | ICD-10-CM

## 2017-05-21 NOTE — Patient Instructions (Signed)
Aim for 7+ servings of fruits and vegetables daily  At last 1 serving of beans and nuts daily, try to incorporate healthy fats such as avocado and olive oil as well  80+ fluid ounces of water or unsweet tea for healthy kidneys  Try not to cut calories too much - 900 is too low - recommend variable caloric intake, and possibly not even fasting every single day to mix it up and keep your body guessing- if you do end up eating less one day, try to increase the next, etc.  Limit alcohol  Limit animal fats in diet for cholesterol and heart health - choose grass fed whenever available  Aim for low stress - take time to unwind and care for your mental health  Aim for 150 min of moderate intensity exercise weekly for heart health, and weights twice weekly for bone health  Aim for 7-9 hours of sleep daily      When it comes to diets, agreement about the perfect plan isn't easy to find, even among the experts. Experts at the [place] developed an idea known as the Healthy Eating Plate. Just imagine a plate divided into logical, healthy portions.  The emphasis is on diet quality:  Load up on vegetables and fruits - one-half of your plate: Aim for color and variety, and remember that potatoes don't count.  Go for whole grains - one-quarter of your plate: Whole wheat, barley, wheat berries, quinoa, oats, brown rice, and foods made with them. If you want pasta, go with whole wheat pasta.  Protein power - one-quarter of your plate: Fish, chicken, beans, and nuts are all healthy, versatile protein sources. Limit red meat.  The diet, however, does go beyond the plate, offering a few other suggestions.  Use healthy plant oils, such as olive, canola, soy, corn, sunflower and peanut. Check the labels, and avoid partially hydrogenated oil, which have unhealthy trans fats.  If you're thirsty, drink water. Coffee and tea are good in moderation, but skip sugary drinks and limit  milk and dairy products to one or two daily servings.  The type of carbohydrate in the diet is more important than the amount. Some sources of carbohydrates, such as vegetables, fruits, whole grains, and beans-are healthier than others.  Finally, stay active.

## 2017-06-20 DIAGNOSIS — Z1389 Encounter for screening for other disorder: Secondary | ICD-10-CM | POA: Diagnosis not present

## 2017-06-20 DIAGNOSIS — Z1231 Encounter for screening mammogram for malignant neoplasm of breast: Secondary | ICD-10-CM | POA: Diagnosis not present

## 2017-06-20 DIAGNOSIS — Z01419 Encounter for gynecological examination (general) (routine) without abnormal findings: Secondary | ICD-10-CM | POA: Diagnosis not present

## 2017-06-20 DIAGNOSIS — Z6831 Body mass index (BMI) 31.0-31.9, adult: Secondary | ICD-10-CM | POA: Diagnosis not present

## 2017-07-19 ENCOUNTER — Other Ambulatory Visit: Payer: Self-pay | Admitting: Physician Assistant

## 2017-07-22 DIAGNOSIS — M5412 Radiculopathy, cervical region: Secondary | ICD-10-CM | POA: Diagnosis not present

## 2017-10-15 DIAGNOSIS — M4722 Other spondylosis with radiculopathy, cervical region: Secondary | ICD-10-CM | POA: Diagnosis not present

## 2017-10-15 DIAGNOSIS — M5412 Radiculopathy, cervical region: Secondary | ICD-10-CM | POA: Diagnosis not present

## 2017-10-23 NOTE — Progress Notes (Signed)
Complete Physical  Assessment and Plan:  Elevated blood pressure reading without diagnosis of hypertension - continue medications, DASH diet, exercise and monitor at home. Call if greater than 130/80.  -     CBC with Differential/Platelet -     BASIC METABOLIC PANEL WITH GFR -     Hepatic function panel -     TSH -     Urinalysis, Routine w reflex microscopic -     Microalbumin / creatinine urine ratio  Hyperlipidemia, unspecified hyperlipidemia type -continue medications, check lipids, decrease fatty foods, increase activity.  -     Lipid panel  Vitamin D deficiency -     VITAMIN D 25 Hydroxy (Vit-D Deficiency, Fractures)  HLA B27 (HLA B27 positive) Monitor  Family history of ischemic heart disease -     EKG 12-Lead  History of tobacco abuse -     EKG 12-Lead  DDD (degenerative disc disease), cervical Monitor  DDD (degenerative disc disease), lumbar Monitor  Medication management -     Magnesium  Screening, anemia, deficiency, iron -     Iron,Total/Total Iron Binding Cap -     Vitamin B12  Class 2 obesity due to excess calories without serious comorbidity with body mass index (BMI) of 31.0 to 31.9 in adult - long discussion about weight loss, diet, and exercise - will try naltrexone at night for emitional eating at night off label- discussed with patient - if this does not help willing to retry wellbutrin   Encounter for general adult medical examination with abnormal findings 1 year  Discussed med's effects and SE's. Screening labs and tests as requested with regular follow-up as recommended. Over 40 minutes of exam, counseling, chart review, and complex, high level critical decision making was performed this visit.   HPI  58 y.o. female  presents for a complete physical and follow up for has Family history of ischemic heart disease; History of tobacco abuse; Vitamin D deficiency; Elevated blood pressure reading without diagnosis of hypertension;  Hyperlipidemia; DDD (degenerative disc disease), cervical; DDD (degenerative disc disease), lumbar; HLA B27 (HLA B27 positive); History of IBS; BMI 33.0-33.9,adult; and Insomnia on their problem list..  Her blood pressure has been controlled at home, today their BP is BP: 118/74 She does not workout. She denies chest pain, shortness of breath, dizziness.  Having anal fissure, treated by GI, getting colonoscopy next week.  She had a normal AB [place] and HIDA    She is not on cholesterol medication and denies myalgias. Her cholesterol is at goal. The cholesterol last visit was:   Lab Results  Component Value Date   CHOL 210 (H) 03/13/2017   HDL 67 03/13/2017   LDLCALC 114 (H) 03/13/2017   TRIG 170 (H) 03/13/2017   CHOLHDL 3.1 03/13/2017    Last A1C in the office was:  Lab Results  Component Value Date   HGBA1C 5.4 07/04/2016   Last GFR: Lab Results  Component Value Date   GFRNONAA 104 03/13/2017   Patient is on Vitamin D supplement.   Lab Results  Component Value Date   VD25OH 27 (L) 10/17/2016     BMI is Body mass index is 31.74 kg/m., she is working on diet and exercise. She is just drinking water.  She has had a lot of stress with work lately and states that she is not eating well. She is off phentermine, could not tolerate topamax and wellbutrin. Has not tried naltrexone.  She has trouble sleeping at night, she is  off iron due to constipation, she states trazodone helps but she has restless legs at night, feels better when she moves them.  Wt Readings from Last 3 Encounters:  10/24/17 187 lb 12.8 oz (85.2 kg)  05/21/17 186 lb (84.4 kg)  04/18/17 195 lb 9.6 oz (88.7 kg)    Current Medications:  Current Outpatient Medications on File Prior to Visit  Medication Sig Dispense Refill  . acyclovir (ZOVIRAX) 800 MG tablet Take 1 tablet (800 mg total) by mouth daily as needed. 90 tablet 1  . clobetasol ointment (TEMOVATE) 5.63 % Apply 1 application topically 2 (two) times daily as  needed.    . meclizine (ANTIVERT) 25 MG tablet Take 25 mg by mouth 3 (three) times daily as needed for dizziness.    . metroNIDAZOLE (METROCREAM) 0.75 % cream Apply topically 2 (two) times daily. 60 g 1  . nystatin ointment (MYCOSTATIN) Apply 1 application topically 2 (two) times daily. 30 g 0  . phentermine (ADIPEX-P) 37.5 MG tablet TAKE 1 TABLET (37.5 MG TOTAL) BY MOUTH DAILY BEFORE BREAKFAST. 30 tablet 2  . PROCTOSOL HC 2.5 % rectal cream PLACE 1 APPLICATION RECTALLY 2 (TWO) TIMES DAILY. 30 g 1  . traZODone (DESYREL) 50 MG tablet 1/2-1 tablet for sleep 30 tablet 2  . diltiazem 2 % GEL Apply 1 application topically 2 (two) times daily. 30 g 3   No current facility-administered medications on file prior to visit.    Allergies:  Allergies  Allergen Reactions  . Aspirin Other (See Comments)    GI upset  . Mobic [Meloxicam] Other (See Comments)    GI upset  . Wellbutrin [Bupropion] Other (See Comments)    headache  . Z-Pak [Azithromycin] Other (See Comments)    thrush  . Penicillins Rash   Medical History:  She has Family history of ischemic heart disease; History of tobacco abuse; Vitamin D deficiency; Elevated blood pressure reading without diagnosis of hypertension; Hyperlipidemia; DDD (degenerative disc disease), cervical; DDD (degenerative disc disease), lumbar; HLA B27 (HLA B27 positive); History of IBS; BMI 33.0-33.9,adult; and Insomnia on their problem list. Health Maintenance:   Immunization History  Administered Date(s) Administered  . Hepatitis B 01/16/2000  . PPD Test 02/18/2013  . Pneumococcal Polysaccharide-23 01/15/1993, 02/18/2013  . Tdap 09/16/2006   Tetanus: 2008 DUE GET TODAY Pneumovax: 2015 Prevnar 13: age 59 Flu vaccine: declines Zostavax: declines  Pap: gets [facility] MGM: 2019 gets at GYN DEXA: defer age 18 Colonoscopy: 11/2016 EGD: Ct spine 2015  Patient Care Team: Unk Pinto, MD as PCP - General (Internal Medicine)  Surgical  History:  She has a past surgical history that includes lower back; Neck surgery; Plantar fascia surgery; and Cesarean section. Family History:  Herfamily history includes Cancer in her maternal aunt; Cervical cancer in her sister; Diabetes in her father; Heart disease in her father, maternal grandfather, and maternal grandmother; Hyperlipidemia in her father and mother; Hypertension in her father and mother; Stroke in her maternal aunt. Social History:  She reports that she quit smoking about 3 years ago. Her smoking use included cigarettes. She quit after 22.00 years of use. She has never used smokeless tobacco. She reports that she does not drink alcohol or use drugs.  Review of Systems: ROS  Physical Exam: Estimated body mass index is 31.74 kg/m as calculated from the following:   Height as of this encounter: 5' 4.5" (1.638 m).   Weight as of this encounter: 187 lb 12.8 oz (85.2 kg). BP  118/74   Pulse 77   Temp (!) 97.4 F (36.3 C)   Ht 5' 4.5" (1.638 m)   Wt 187 lb 12.8 oz (85.2 kg)   LMP 05/15/2010   SpO2 98%   BMI 31.74 kg/m  General Appearance: Well nourished, in no apparent distress.  Eyes: PERRLA, EOMs, conjunctiva no swelling or erythema, normal fundi and vessels.  Sinuses: No Frontal/maxillary tenderness  ENT/Mouth: Ext aud canals clear, normal light reflex with TMs without erythema, bulging. Good dentition. No erythema, swelling, or exudate on post pharynx. Tonsils not swollen or erythematous. Hearing normal.  Neck: Supple, thyroid normal. No bruits  Respiratory: Respiratory effort normal, BS equal bilaterally without rales, rhonchi, wheezing or stridor.  Cardio: RRR without murmurs, rubs or gallops. Brisk peripheral pulses without edema.  Chest: symmetric, with normal excursions and percussion.  Breasts: defer Abdomen: Soft, nontender, no guarding, rebound, hernias, masses, or organomegaly.  Lymphatics: Non tender without lymphadenopathy.  Genitourinary:  defer Musculoskeletal: Full ROM all peripheral extremities,5/5 strength, and normal gait.  Skin: Warm, dry without rashes, lesions, ecchymosis. Neuro: Cranial nerves intact, reflexes equal bilaterally. Normal muscle tone, no cerebellar symptoms. Sensation intact.  Psych: Awake and oriented X 3, normal affect, Insight and Judgment appropriate.   EKG: WNL no ST changes. AORTA SCAN:defer  Vicie Mutters 8:57 AM [facility]

## 2017-10-24 ENCOUNTER — Encounter: Payer: Self-pay | Admitting: Physician Assistant

## 2017-10-24 ENCOUNTER — Ambulatory Visit (INDEPENDENT_AMBULATORY_CARE_PROVIDER_SITE_OTHER): Payer: BLUE CROSS/BLUE SHIELD | Admitting: Physician Assistant

## 2017-10-24 VITALS — BP 118/74 | HR 77 | Temp 97.4°F | Ht 64.5 in | Wt 187.8 lb

## 2017-10-24 DIAGNOSIS — Z23 Encounter for immunization: Secondary | ICD-10-CM | POA: Diagnosis not present

## 2017-10-24 DIAGNOSIS — Z8249 Family history of ischemic heart disease and other diseases of the circulatory system: Secondary | ICD-10-CM

## 2017-10-24 DIAGNOSIS — Z13220 Encounter for screening for lipoid disorders: Secondary | ICD-10-CM | POA: Diagnosis not present

## 2017-10-24 DIAGNOSIS — I10 Essential (primary) hypertension: Secondary | ICD-10-CM | POA: Diagnosis not present

## 2017-10-24 DIAGNOSIS — R030 Elevated blood-pressure reading, without diagnosis of hypertension: Secondary | ICD-10-CM

## 2017-10-24 DIAGNOSIS — Z0000 Encounter for general adult medical examination without abnormal findings: Secondary | ICD-10-CM | POA: Diagnosis not present

## 2017-10-24 DIAGNOSIS — Z136 Encounter for screening for cardiovascular disorders: Secondary | ICD-10-CM

## 2017-10-24 DIAGNOSIS — Z8719 Personal history of other diseases of the digestive system: Secondary | ICD-10-CM

## 2017-10-24 DIAGNOSIS — Z1329 Encounter for screening for other suspected endocrine disorder: Secondary | ICD-10-CM

## 2017-10-24 DIAGNOSIS — G4700 Insomnia, unspecified: Secondary | ICD-10-CM

## 2017-10-24 DIAGNOSIS — Z1589 Genetic susceptibility to other disease: Secondary | ICD-10-CM

## 2017-10-24 DIAGNOSIS — M5030 Other cervical disc degeneration, unspecified cervical region: Secondary | ICD-10-CM

## 2017-10-24 DIAGNOSIS — Z0001 Encounter for general adult medical examination with abnormal findings: Secondary | ICD-10-CM

## 2017-10-24 DIAGNOSIS — E559 Vitamin D deficiency, unspecified: Secondary | ICD-10-CM

## 2017-10-24 DIAGNOSIS — Z1389 Encounter for screening for other disorder: Secondary | ICD-10-CM | POA: Diagnosis not present

## 2017-10-24 DIAGNOSIS — M5136 Other intervertebral disc degeneration, lumbar region: Secondary | ICD-10-CM

## 2017-10-24 DIAGNOSIS — Z79899 Other long term (current) drug therapy: Secondary | ICD-10-CM | POA: Diagnosis not present

## 2017-10-24 DIAGNOSIS — Z6833 Body mass index (BMI) 33.0-33.9, adult: Secondary | ICD-10-CM

## 2017-10-24 DIAGNOSIS — E785 Hyperlipidemia, unspecified: Secondary | ICD-10-CM

## 2017-10-24 DIAGNOSIS — Z130 Encounter for screening for diseases of the blood and blood-forming organs and certain disorders involving the immune mechanism: Secondary | ICD-10-CM | POA: Diagnosis not present

## 2017-10-24 DIAGNOSIS — Z87891 Personal history of nicotine dependence: Secondary | ICD-10-CM

## 2017-10-24 DIAGNOSIS — E538 Deficiency of other specified B group vitamins: Secondary | ICD-10-CM

## 2017-10-24 MED ORDER — NALTREXONE HCL 50 MG PO TABS: 25.0000 mg | ORAL_TABLET | Freq: Every day | ORAL | 0 refills | Status: DC

## 2017-10-24 NOTE — Patient Instructions (Addendum)
Try the naltrexone at night with supper to decrease cravings Give it 2-4 weeks If this does not help we can try wellbutrin or concentrate on RLS and try requip  Are you an emotional eater? Do you eat more when you're feeling stressed? Do you eat when you're not hungry or when you're full? Do you eat to feel better (to calm and soothe yourself when you're sad, mad, bored, anxious, etc.)? Do you reward yourself with food? Do you regularly eat until you've stuffed yourself? Does food make you feel safe? Do you feel like food is a friend? Do you feel powerless or out of control around food?  If you answered yes to some of these questions than it is likely that you are an emotional eater. This is normally a learned behavior and can take time to first recognize the signs and second BREAK THE HABIT. But here is more information and tips to help.   The difference between emotional hunger and physical hunger Emotional hunger can be powerful, so it's easy to mistake it for physical hunger. But there are clues you can look for to help you tell physical and emotional hunger apart.  Emotional hunger comes on suddenly. It hits you in an instant and feels overwhelming and urgent. Physical hunger, on the other hand, comes on more gradually. The urge to eat doesn't feel as dire or demand instant satisfaction (unless you haven't eaten for a very long time).  Emotional hunger craves specific comfort foods. When you're physically hungry, almost anything sounds good-including healthy stuff like vegetables. But emotional hunger craves junk food or sugary snacks that provide an instant rush. You feel like you need cheesecake or pizza, and nothing else will do.  Emotional hunger often leads to mindless eating. Before you know it, you've eaten a whole bag of chips or an entire pint of ice cream without really paying attention or fully enjoying it. When you're eating in response to physical hunger, you're typically more  aware of what you're doing.  Emotional hunger isn't satisfied once you're full. You keep wanting more and more, often eating until you're uncomfortably stuffed. Physical hunger, on the other hand, doesn't need to be stuffed. You feel satisfied when your stomach is full.  Emotional hunger isn't located in the stomach. Rather than a growling belly or a pang in your stomach, you feel your hunger as a craving you can't get out of your head. You're focused on specific textures, tastes, and smells.  Emotional hunger often leads to regret, guilt, or shame. When you eat to satisfy physical hunger, you're unlikely to feel guilty or ashamed because you're simply giving your body what it needs. If you feel guilty after you eat, it's likely because you know deep down that you're not eating for nutritional reasons.  Identify your emotional eating triggers What situations, places, or feelings make you reach for the comfort of food? Most emotional eating is linked to unpleasant feelings, but it can also be triggered by positive emotions, such as rewarding yourself for achieving a goal or celebrating a holiday or happy event. Common causes of emotional eating include:  Stuffing emotions - Eating can be a way to temporarily silence or "stuff down" uncomfortable emotions, including anger, fear, sadness, anxiety, loneliness, resentment, and shame. While you're numbing yourself with food, you can avoid the difficult emotions you'd rather not feel.  Boredom or feelings of emptiness - Do you ever eat simply to give yourself something to do, to relieve boredom,  or as a way to fill a void in your life? You feel unfulfilled and empty, and food is a way to occupy your mouth and your time. In the moment, it fills you up and distracts you from underlying feelings of purposelessness and dissatisfaction with your life.  Childhood habits - Think back to your childhood memories of food. Did your parents reward good behavior with ice  cream, take you out for pizza when you got a good report card, or serve you sweets when you were feeling sad? These habits can often carry over into adulthood. Or your eating may be driven by nostalgia-for cherished memories of grilling burgers in the backyard with your dad or baking and eating cookies with your mom.  Social influences - Getting together with other people for a meal is a great way to relieve stress, but it can also lead to overeating. It's easy to overindulge simply because the food is there or because everyone else is eating. You may also overeat in social situations out of nervousness. Or perhaps your family or circle of friends encourages you to overeat, and it's easier to go along with the group.  Stress - Ever notice how stress makes you hungry? It's not just in your mind. When stress is chronic, as it so often is in our chaotic, fast-paced world, your body produces high levels of the stress hormone, cortisol. Cortisol triggers cravings for salty, sweet, and fried foods-foods that give you a burst of energy and pleasure. The more uncontrolled stress in your life, the more likely you are to turn to food for emotional relief.  Find other ways to feed your feelings If you don't know how to manage your emotions in a way that doesn't involve food, you won't be able to control your eating habits for very long. Diets so often fail because they offer logical nutritional advice which only works if you have conscious control over your eating habits. It doesn't work when emotions hijack the process, demanding an immediate payoff with food.  In order to stop emotional eating, you have to find other ways to fulfill yourself emotionally. It's not enough to understand the cycle of emotional eating or even to understand your triggers, although that's a huge first step. You need alternatives to food that you can turn to for emotional fulfillment.  Alternatives to emotional eating If you're depressed  or lonely, call someone who always makes you feel better, play with your dog or cat, or look at a favorite photo or cherished memento.  If you're anxious, expend your nervous energy by dancing to your favorite song, squeezing a stress ball, or taking a brisk walk.  If you're exhausted, treat yourself with a hot cup of tea, take a bath, light some scented candles, or wrap yourself in a warm blanket.  If you're bored, read a good book, watch a comedy show, explore the outdoors, or turn to an activity you enjoy (woodworking, playing the guitar, shooting hoops, scrapbooking, etc.).  What is mindful eating? Mindful eating is a practice that develops your awareness of eating habits and allows you to pause between your triggers and your actions. Most emotional eaters feel powerless over their food cravings. When the urge to eat hits, you feel an almost unbearable tension that demands to be fed, right now. Because you've tried to resist in the past and failed, you believe that your willpower just isn't up to snuff. But the truth is that you have more power over your  cravings than you think.  Take 5 before you give in to a craving Emotional eating tends to be automatic and virtually mindless. Before you even realize what you're doing, you've reached for a tub of ice cream and polished off half of it. But if you can take a moment to pause and reflect when you're hit with a craving, you give yourself the opportunity to make a different decision.  Can you put off eating for five minutes? Or just start with one minute. Don't tell yourself you can't give in to the craving; remember, the forbidden is extremely tempting. Just tell yourself to wait.  While you're waiting, check in with yourself. How are you feeling? What's going on emotionally? Even if you end up eating, you'll have a better understanding of why you did it. This can help you set yourself up for a different response next time.  How to practice  mindful eating Eating while you're also doing other things-such as watching TV, driving, or playing with your phone-can prevent you from fully enjoying your food. Since your mind is elsewhere, you may not feel satisfied or continue eating even though you're no longer hungry. Eating more mindfully can help focus your mind on your food and the pleasure of a meal and curb overeating.   Eat your meals in a calm place with no distractions, aside from any dining companions.  Try eating with your non-dominant hand or using chopsticks instead of a knife and fork. Eating in such a non-familiar way can slow down how fast you eat and ensure your mind stays focused on your food.  Allow yourself enough time not to have to rush your meal. Set a timer for 20 minutes and pace yourself so you spend at least that much time eating.  Take small bites and chew them well, taking time to notice the different flavors and textures of each mouthful.  Put your utensils down between bites. Take time to consider how you feel-hungry, satiated-before picking up your utensils again.  Try to stop eating before you are full.It takes time for the signal to reach your brain that you've had enough. Don't feel obligated to always clean your plate.  When you've finished your food, take a few moments to assess if you're really still hungry before opting for an extra serving or dessert.  Learn to accept your feelings-even the bad ones  While it may seem that the core problem is that you're powerless over food, emotional eating actually stems from feeling powerless over your emotions. You don't feel capable of dealing with your feelings head on, so you avoid them with food.  Recommended reading  Mini Habits for weight loss  Healthy Eating: A guide to the new nutrition - [employer] Report  10 Tips for Mindful Eating - How mindfulness can help you fully enjoy a meal and the experience of eating-with  moderation and restraint. ([place])  Weight Loss: Gain Control of Emotional Eating - Tips to regain control of your eating habits. [facility])  Why Stress Causes People to Overeat -Tips on controlling stress eating. ([employer])  Mindful Eating Meditations -Free online mindfulness meditations. ([employer])      Vitamin D goal is between 60-80  Please make sure that you are taking your Vitamin D as directed.   It is very important as a natural anti-inflammatory   helping hair, skin, and nails, as well as reducing stroke and heart  attack risk.   It helps your bones and helps with mood.  We want you on at least 5000 IU daily  It also decreases numerous cancer risks so please take it as directed.   Low Vit D is associated with a 200-300% higher risk for CANCER   and 200-300% higher risk for HEART   ATTACK  &  STROKE.    .....................................Marland Kitchen  It is also associated with higher death rate at younger ages,   autoimmune diseases like Rheumatoid arthritis, Lupus, Multiple Sclerosis.     Also many other serious conditions, like depression, Alzheimer's  Dementia, infertility, muscle aches, fatigue, fibromyalgia - just to name a few.  +++++++++++++++++++  Can get liquid vitamin D from [place] here in [place] at  [facility] [address], [place], [place] [address] Or you can try earth fare   You can try some of the at home treatments for Restless legs and we will check some labs on you looking for deficiencies that could contribute to it however we will have you start on :    Restless Legs Syndrome Restless legs syndrome is a movement disorder. It may also be called a sensorimotor disorder.  CAUSES  No one knows what specifically causes restless legs syndrome, but it tends to run in families. It is also more common in people with low iron, in pregnancy, in people who need dialysis, and those with  nerve damage (neuropathy).Some medications may make restless legs syndrome worse.Those medications include drugs to treat high blood pressure, some heart conditions, nausea, colds, allergies, and depression. SYMPTOMS Symptoms include uncomfortable sensations in the legs. These leg sensations are worse during periods of inactivity or rest. They are also worse while sitting or lying down. Individuals that have the disorder describe sensations in the legs that feel like:  Pulling.  Drawing.  Crawling.  Worming.  Boring.  Tingling.  Pins and needles.  Prickling.  Pain. The sensations are usually accompanied by an overwhelming urge to move the legs. Sudden muscle jerks may also occur. Movement provides temporary relief from the discomfort. In rare cases, the arms may also be affected. Symptoms may interfere with going to sleep (sleep onset insomnia). Restless legs syndrome may also be related to periodic limb movement disorder (PLMD). PLMD is another more common motor disorder. It also causes interrupted sleep. The symptoms from PLMD usually occur most often when you are awake. TREATMENT  Treatment for restless legs syndrome is symptomatic. This means that the symptoms are treated.   Massage and cold compresses may provide temporary relief.  Walk, stretch, or take a cold or hot bath.  Get regular exercise and a good night's sleep.  Avoid caffeine, alcohol, nicotine, and medications that can make it worse.  Do activities that provide mental stimulation like discussions, needlework, and video games. These may be helpful if you are not able to walk or stretch. Some medications are effective in relieving the symptoms. However, many of these medications have side effects. Ask your caregiver about medications that may help your symptoms. Correcting iron deficiency may improve symptoms for some patients. Document Released: 12/22/2001 Document Revised: 05/18/2013 Document Reviewed:  03/30/2010 [facility] Patient Information 2015 [place], [place]. This information is not intended to replace advice given to you by your health care provider. Make sure you discuss any questions you have with your health care provider.  Naltrexone tablets What is this medicine? NALTREXONE (nal TREX one) helps you to remain free of your dependence on opiate drugs or  alcohol. It blocks the 'high' that these substances can give you. This medicine is combined with counseling and support groups. This medicine may be used for other purposes; ask your health care provider or pharmacist if you have questions. COMMON BRAND NAME(S): Depade, ReVia What should I tell my health care provider before I take this medicine? They need to know if you have any of these conditions: -if you have used drugs or alcohol within 7 to 10 days -kidney disease -liver disease, including hepatitis -an unusual or allergic reaction to naltrexone, other medicines, foods, dyes, or preservatives -pregnant or trying to get pregnant -breast-feeding How should I use this medicine? Take this medicine by mouth with a full glass of water. Follow the directions on the prescription label. Do not take this medicine within 7 to 10 days of taking any opioid drugs. Take your medicine at regular intervals. Do not take your medicine more often than directed. Do not stop taking except on your doctor's advice. Talk to your pediatrician regarding the use of this medicine in children. Special care may be needed. Overdosage: If you think you have taken too much of this medicine contact a poison control center or emergency room at once. NOTE: This medicine is only for you. Do not share this medicine with others. What if I miss a dose? If you miss a dose and remember on the same day, take the missed dose. If you do not remember until the next day, ask your doctor or health care professional about rescheduling your doses. Do not take double or extra  doses. What may interact with this medicine? Do not take this medicine with any of the following medications: -any prescription or street opioid drug like codiene, heroin, methadone This medicine may also interact with the following medications: -disulfiram -thioridazine This list may not describe all possible interactions. Give your health care provider a list of all the medicines, herbs, non-prescription drugs, or dietary supplements you use. Also tell them if you smoke, drink alcohol, or use illegal drugs. Some items may interact with your medicine. What should I watch for while using this medicine? Your condition will be monitored carefully while you are receiving this medicine. Visit your doctor or health care professional regularly. For this medicine to be most effective you should attend any counseling or support groups that your doctor or health care professional recommends. Do not try to overcome the effects of the medicine by taking large amounts of narcotics or by drinking large amounts of alcohol. This can cause severe problems including death. Also, you may be more sensitive to lower doses of narcotics after you stop taking this medicine. If you are going to have surgery, tell your doctor or health care professional that you are taking this medicine. Do not treat yourself for coughs, colds, pain, or diarrhea. Ask your doctor or health care professional for advice. Some of the ingredients may interact with this medicine and cause side effects. Wear a medical ID bracelet or chain, and carry a card that describes your disease and details of your medicine and dosage times. You may get drowsy or dizzy. Do not drive, use machinery, or do anything that needs mental alertness until you know how this medicine affects you. Do not stand or sit up quickly, especially if you are an older patient. This reduces the risk of dizzy or fainting spells. Alcohol may interfere with the effect of this medicine.  Avoid alcoholic drinks. What side effects may I notice from receiving this  medicine? Side effects that you should report to your doctor or health care professional as soon as possible: -allergic reactions like skin rash, itching or hives, swelling of the face, lips, or tongue -breathing problems -changes in vision, hearing -confusion -dark urine -depressed mood -diarrhea -fast or irregular heart beat -hallucination, loss of contact with reality -light-colored stools -right upper belly pain -suicidal thoughts or other mood changes -unusually weak or tired -vomiting -yellowing of the eyes or skin Side effects that usually do not require medical attention (report to your doctor or health care professional if they continue or are bothersome): -aches, pains -change in sex drive or performance -feeling anxious -headache -loss of appetite, nausea -runny nose, sinus problems, sneezing -stomach pain -trouble sleeping This list may not describe all possible side effects. Call your doctor for medical advice about side effects. You may report side effects to FDA at 1-800-FDA-1088. Where should I keep my medicine? Keep out of the reach of children. Store at room temperature between 20 and 25 degrees C (68 and 77 degrees F). Throw away any unused medicine after the expiration date. NOTE: This sheet is a summary. It may not cover all possible information. If you have questions about this medicine, talk to your doctor, pharmacist, or health care provider.  2018 Elsevier/Gold Standard (2011-10-25 10:33:18)

## 2017-10-25 ENCOUNTER — Other Ambulatory Visit: Payer: Self-pay | Admitting: Physician Assistant

## 2017-10-25 LAB — URINALYSIS, ROUTINE W REFLEX MICROSCOPIC
Bacteria, UA: NONE SEEN /HPF
Bilirubin Urine: NEGATIVE
GLUCOSE, UA: NEGATIVE
Hgb urine dipstick: NEGATIVE
Hyaline Cast: NONE SEEN /LPF
Ketones, ur: NEGATIVE
NITRITE: NEGATIVE
PROTEIN: NEGATIVE
RBC / HPF: NONE SEEN /HPF (ref 0–2)
SQUAMOUS EPITHELIAL / LPF: NONE SEEN /HPF (ref <=5)
Specific Gravity, Urine: 1.007 (ref 1.001–1.03)
WBC, UA: NONE SEEN /HPF (ref 0–5)
pH: 6.5 (ref 5.0–8.0)

## 2017-10-25 LAB — CBC WITH DIFFERENTIAL/PLATELET
Basophils Absolute: 39 cells/uL (ref 0–200)
Basophils Relative: 0.5 %
Eosinophils Absolute: 92 cells/uL (ref 15–500)
Eosinophils Relative: 1.2 %
HCT: 40 % (ref 35.0–45.0)
Hemoglobin: 13 g/dL (ref 11.7–15.5)
Lymphs Abs: 3119 cells/uL (ref 850–3900)
MCH: 26.4 pg — ABNORMAL LOW (ref 27.0–33.0)
MCHC: 32.5 g/dL (ref 32.0–36.0)
MCV: 81.3 fL (ref 80.0–100.0)
MPV: 10.5 fL (ref 7.5–12.5)
Monocytes Relative: 6.1 %
Neutro Abs: 3981 cells/uL (ref 1500–7800)
Neutrophils Relative %: 51.7 %
PLATELETS: 271 10*3/uL (ref 140–400)
RBC: 4.92 10*6/uL (ref 3.80–5.10)
RDW: 12.1 % (ref 11.0–15.0)
TOTAL LYMPHOCYTE: 40.5 %
WBC: 7.7 10*3/uL (ref 3.8–10.8)
WBCMIX: 470 {cells}/uL (ref 200–950)

## 2017-10-25 LAB — IRON, TOTAL/TOTAL IRON BINDING CAP
%SAT: 41 % (ref 16–45)
Iron: 127 ug/dL (ref 45–160)
TIBC: 313 ug/dL (ref 250–450)

## 2017-10-25 LAB — LIPID PANEL
CHOL/HDL RATIO: 3 (calc) (ref <5.0)
CHOLESTEROL: 184 mg/dL (ref <200)
HDL: 62 mg/dL (ref >50)
LDL Cholesterol (Calc): 105 mg/dL (calc) — ABNORMAL HIGH
Non-HDL Cholesterol (Calc): 122 mg/dL (calc) (ref <130)
Triglycerides: 81 mg/dL (ref <150)

## 2017-10-25 LAB — MAGNESIUM: Magnesium: 2.1 mg/dL (ref 1.5–2.5)

## 2017-10-25 LAB — COMPLETE METABOLIC PANEL WITH GFR
AG RATIO: 2 (calc) (ref 1.0–2.5)
ALT: 6 U/L (ref 6–29)
AST: 12 U/L (ref 10–35)
Albumin: 4.4 g/dL (ref 3.6–5.1)
Alkaline phosphatase (APISO): 87 U/L (ref 33–130)
BUN: 10 mg/dL (ref 7–25)
CALCIUM: 9.8 mg/dL (ref 8.6–10.4)
CO2: 28 mmol/L (ref 20–32)
Chloride: 104 mmol/L (ref 98–110)
Creat: 0.62 mg/dL (ref 0.50–1.05)
GFR, EST NON AFRICAN AMERICAN: 100 mL/min/{1.73_m2} (ref >=60)
GFR, Est African American: 116 mL/min/{1.73_m2} (ref >=60)
Globulin: 2.2 g/dL (calc) (ref 1.9–3.7)
Glucose, Bld: 90 mg/dL (ref 65–99)
POTASSIUM: 4.5 mmol/L (ref 3.5–5.3)
Sodium: 140 mmol/L (ref 135–146)
Total Bilirubin: 0.5 mg/dL (ref 0.2–1.2)
Total Protein: 6.6 g/dL (ref 6.1–8.1)

## 2017-10-25 LAB — VITAMIN B12: Vitamin B-12: 529 pg/mL (ref 200–1100)

## 2017-10-25 LAB — VITAMIN D 25 HYDROXY (VIT D DEFICIENCY, FRACTURES): Vit D, 25-Hydroxy: 27 ng/mL — ABNORMAL LOW (ref 30–100)

## 2017-10-25 LAB — TSH: TSH: 1.84 mIU/L (ref 0.40–4.50)

## 2017-10-25 LAB — MICROALBUMIN / CREATININE URINE RATIO
Creatinine, Urine: 23 mg/dL (ref 20–275)
MICROALB/CREAT RATIO: 9 ug/mg{creat} (ref <30)
Microalb, Ur: 0.2 mg/dL

## 2017-10-25 MED ORDER — BUPROPION HCL ER (XL) 150 MG PO TB24: 150.0000 mg | ORAL_TABLET | ORAL | 2 refills | Status: DC

## 2017-10-29 ENCOUNTER — Ambulatory Visit (INDEPENDENT_AMBULATORY_CARE_PROVIDER_SITE_OTHER): Payer: BLUE CROSS/BLUE SHIELD | Admitting: Physician Assistant

## 2017-10-29 VITALS — BP 124/68 | HR 77 | Temp 98.4°F | Resp 16 | Ht 64.5 in | Wt 188.0 lb

## 2017-10-29 DIAGNOSIS — J029 Acute pharyngitis, unspecified: Secondary | ICD-10-CM

## 2017-10-29 MED ORDER — DOXYCYCLINE HYCLATE 100 MG PO CAPS: ORAL_CAPSULE | ORAL | 0 refills | Status: DC

## 2017-10-29 MED ORDER — PREDNISONE 20 MG PO TABS: ORAL_TABLET | ORAL | 0 refills | Status: DC

## 2017-10-29 NOTE — Progress Notes (Signed)
Subjective:    Patient ID: Monica [name], female    DOB: [birth year], 57 y.o.   MRN: [id number]  HPI 57 y.o. WF presents with URI x 4 days. She is on hot tea, cepichol. No fever, chills. She is not on an allergy pill. Sore throat mainly, now with cough.   Blood pressure 124/68, pulse 77, temperature 98.4 F (36.9 C), resp. rate 16, height 5' 4.5" (1.638 m), weight 188 lb (85.3 kg), last menstrual period 05/15/2010, SpO2 97 %.  Medications Current Outpatient Medications on File Prior to Visit  Medication Sig  . acyclovir (ZOVIRAX) 800 MG tablet Take 1 tablet (800 mg total) by mouth daily as needed.  Marland Kitchen buPROPion (WELLBUTRIN XL) 150 MG 24 hr tablet Take 1 tablet (150 mg total) by mouth every morning.  . clobetasol ointment (TEMOVATE) 4.65 % Apply 1 application topically 2 (two) times daily as needed.  Marland Kitchen HYDROcodone-Acetaminophen (NORCO PO) Take by mouth as needed.  . meclizine (ANTIVERT) 25 MG tablet Take 25 mg by mouth 3 (three) times daily as needed for dizziness.  . metroNIDAZOLE (METROCREAM) 0.75 % cream Apply topically 2 (two) times daily.  Marland Kitchen nystatin ointment (MYCOSTATIN) Apply 1 application topically 2 (two) times daily.  . phentermine (ADIPEX-P) 37.5 MG tablet TAKE 1 TABLET (37.5 MG TOTAL) BY MOUTH DAILY BEFORE BREAKFAST.  Marland Kitchen PROCTOSOL HC 2.5 % rectal cream PLACE 1 APPLICATION RECTALLY 2 (TWO) TIMES DAILY.  . traZODone (DESYREL) 50 MG tablet 1/2-1 tablet for sleep  . diltiazem 2 % GEL Apply 1 application topically 2 (two) times daily.   No current facility-administered medications on file prior to visit.     Problem list She has Family history of ischemic heart disease; History of tobacco abuse; Vitamin D deficiency; Elevated blood pressure reading without diagnosis of hypertension; Hyperlipidemia; DDD (degenerative disc disease), cervical; DDD (degenerative disc disease), lumbar; HLA B27 (HLA B27 positive); History of IBS; BMI 31.0-31.9,adult; Insomnia; and B12 deficiency on  their problem list.  Allergies  Allergen Reactions  . Aspirin Other (See Comments)    GI upset  . Mobic [Meloxicam] Other (See Comments)    GI upset  . Wellbutrin [Bupropion] Other (See Comments)    headache  . Z-Pak [Azithromycin] Other (See Comments)    thrush  . Penicillins Rash    Review of Systems  Constitutional: Positive for chills. Negative for diaphoresis.  HENT: Positive for congestion, ear pain, sinus pressure and sore throat. Negative for sneezing.   Respiratory: Negative.  Negative for cough and shortness of breath.   Cardiovascular: Negative.   Musculoskeletal: Positive for neck pain.  Neurological: Negative.  Negative for headaches.       Objective:   Physical Exam  Constitutional: She is oriented to person, place, and time. She appears well-developed and well-nourished.  HENT:  Right Ear: Hearing and external ear normal. No mastoid tenderness. Tympanic membrane is injected. Tympanic membrane is not perforated, not erythematous, not retracted and not bulging. A middle ear effusion is present.  Left Ear: Hearing and external ear normal. No mastoid tenderness. Tympanic membrane is injected. Tympanic membrane is not perforated, not erythematous, not retracted and not bulging. A middle ear effusion is present.  Nose: Right sinus exhibits maxillary sinus tenderness. Left sinus exhibits maxillary sinus tenderness.  Mouth/Throat: Uvula is midline and mucous membranes are normal. Posterior oropharyngeal edema and posterior oropharyngeal erythema present.  Eyes: Pupils are equal, round, and reactive to light. Conjunctivae and EOM are normal.  Neck: Neck supple.  Cardiovascular:  Normal rate and regular rhythm.  Pulmonary/Chest: Effort normal and breath sounds normal. No respiratory distress. She has no wheezes.  Abdominal: Soft. Bowel sounds are normal.  Musculoskeletal: Normal range of motion.  Lymphadenopathy:    She has no cervical adenopathy.  Neurological: She is  alert and oriented to person, place, and time.  Skin: Skin is warm and dry.          Assessment & Plan:  Monica [name] was seen today for acute visit, sore throat and cough.  Diagnoses and all orders for this visit:  Sore throat Will hold the doxy and take if she is not getting better, increase fluids, rest, cont allergy pill -     doxycycline (VIBRAMYCIN) 100 MG capsule; Take 1 capsule twice daily with food -     predniSONE (DELTASONE) 20 MG tablet; 2 tablets daily for 3 days   The patient was advised to call immediately if she has any concerning symptoms in the interval. The patient voices understanding of current treatment options and is in agreement with the current care plan.The patient knows to call the clinic with any problems, questions or concerns or go to the ER if any further progression of symptoms.

## 2017-10-29 NOTE — Patient Instructions (Signed)
COLD INFORMATION  Try just the allergy pill and prednisone for a few days, you always want to give your body 10 days to fight off infection with help before taking an anabiotic.   BEWARE ANTIBIOTICS Antibiotics have been linked with colon infections, resistance and newest theory is colon cancer in 40-50 year olds. So it is VERY important to try to avoid them, antibiotics are NOT risk free medications.   If you are not feeling better make an office visit OR CONTACT US.   Here is more info below HOW TO TREAT VIRAL COUGH AND COLD SYMPTOMS:  -Symptoms usually last at least 1 week with the worst symptoms being around day 4.  - colds usually start with a sore throat and end with a cough, and the cough can take 2 weeks to get better.  -No antibiotics are needed for colds, flu, sore throats, cough, bronchitis UNLESS symptoms are longer than 7 days OR if you are getting better then get drastically worse.  -There are a lot of combination medications (Dayquil, Nyquil, Vicks 44, tyelnol cold and sinus, ETC). Please look at the ingredients on the back so that you are treating the correct symptoms and not doubling up on medications/ingredients.    Medicines you can use  Nasal congestion  Little Remedies saline spray (aerosol/mist)- can try this, it is in the kids section - pseudoephedrine (Sudafed)- behind the counter, do not use if you have high blood pressure, medicine that have -D in them.  - phenylephrine (Sudafed PE) -Dextormethorphan + chlorpheniramine (Coridcidin HBP)- okay if you have high blood pressure -Oxymetazoline (Afrin) nasal spray- LIMIT to 3 days -Saline nasal spray -Neti pot (used distilled or bottled water)  Ear pain/congestion  -pseudoephedrine (sudafed) - Nasonex/flonase nasal spray  Fever  -Acetaminophen (Tyelnol) -Ibuprofen (Advil, motrin, aleve)  Sore Throat  -Acetaminophen (Tyelnol) -Ibuprofen (Advil, motrin, aleve) -Drink a lot of water -Gargle with salt water -  Rest your voice (don't talk) -Throat sprays -Cough drops  Body Aches  -Acetaminophen (Tyelnol) -Ibuprofen (Advil, motrin, aleve)  Headache  -Acetaminophen (Tyelnol) -Ibuprofen (Advil, motrin, aleve) - Exedrin, Exedrin Migraine  Allergy symptoms (cough, sneeze, runny nose, itchy eyes) -Claritin or loratadine cheapest but likely the weakest  -Zyrtec or certizine at night because it can make you sleepy -The strongest is allegra or fexafinadine  Cheapest at [employer], [employer], [employer]  Cough  -Dextromethorphan (Delsym)- medicine that has DM in it -Guafenesin (Mucinex/Robitussin) - cough drops - drink lots of water  Chest Congestion  -Guafenesin (Mucinex/Robitussin)  Red Itchy Eyes  - Naphcon-A  Upset Stomach  - Bland diet (nothing spicy, greasy, fried, and high acid foods like tomatoes, oranges, berries) -OKAY- cereal, bread, soup, crackers, rice -Eat smaller more frequent meals -reduce caffeine, no alcohol -Loperamide (Imodium-AD) if diarrhea -Prevacid for heart burn  General health when sick  -Hydration -wash your hands frequently -keep surfaces clean -change pillow cases and sheets often -Get fresh air but do not exercise strenuously -Vitamin D, double up on it - Vitamin C -Zinc   

## 2017-11-04 ENCOUNTER — Telehealth: Payer: Self-pay

## 2017-11-04 NOTE — Telephone Encounter (Signed)
LVM to inform of MyChart Message

## 2017-11-04 NOTE — Telephone Encounter (Signed)
-----   Message from Vicie Mutters, [place] sent at 11/04/2017  3:13 PM EDT ----- Regarding: RE: sick Contact: [phone] Sent mychart message ----- Message ----- From: Elenor Quinones, CMA Sent: 11/04/2017   2:50 PM EDT To: Vicie Mutters, PA-C Subject: sick                                           Per yellow note:   Pt reports started Zpak Friday and is not feeling better. Cough, s/t, congestion, is going to just take time or what other suggestions do you have?  [facility]: [facility]

## 2017-11-04 NOTE — Telephone Encounter (Signed)
-----   Message from Vicie Mutters, [place] sent at 11/04/2017  3:13 PM EDT ----- Regarding: RE: sick Contact: [phone] Sent mychart message ----- Message ----- From: Elenor Quinones, CMA Sent: 11/04/2017   2:50 PM EDT To: Vicie Mutters, PA-C Subject: sick                                           Per yellow note:   Pt reports started Zpak Friday and is not feeling better. Cough, s/t, congestion, is going to just take time or what other suggestions do you have?  [facility]: [facility]

## 2017-11-25 ENCOUNTER — Telehealth: Payer: Self-pay | Admitting: Rheumatology

## 2017-11-25 NOTE — Telephone Encounter (Signed)
Patient called stating she saw Dr. Estanislado Pandy last year for npt appointment  and was told to call if she had problems with her bursitis pain.  Patient states her left hip is beginning to bother her and she is scheduled to see Dr. Ninfa Linden for her knee on 11/25 and is checking if he can treat her for her hip pain too, or if she should make an appointment with Dr. Estanislado Pandy.  Patient requested a return call.

## 2017-11-25 NOTE — Telephone Encounter (Signed)
Patient advised that Dr. Rush Farmer should be able treat her for her hip pain as well as her knee.

## 2017-12-09 ENCOUNTER — Encounter (INDEPENDENT_AMBULATORY_CARE_PROVIDER_SITE_OTHER): Payer: Self-pay | Admitting: Orthopaedic Surgery

## 2017-12-09 ENCOUNTER — Ambulatory Visit (INDEPENDENT_AMBULATORY_CARE_PROVIDER_SITE_OTHER): Payer: BLUE CROSS/BLUE SHIELD | Admitting: Orthopaedic Surgery

## 2017-12-09 ENCOUNTER — Ambulatory Visit (INDEPENDENT_AMBULATORY_CARE_PROVIDER_SITE_OTHER): Payer: Self-pay

## 2017-12-09 DIAGNOSIS — G8929 Other chronic pain: Secondary | ICD-10-CM

## 2017-12-09 DIAGNOSIS — M7062 Trochanteric bursitis, left hip: Secondary | ICD-10-CM | POA: Diagnosis not present

## 2017-12-09 DIAGNOSIS — M25561 Pain in right knee: Secondary | ICD-10-CM

## 2017-12-09 MED ORDER — LIDOCAINE HCL 1 % IJ SOLN
3.0000 mL | INTRAMUSCULAR | Status: AC | PRN
  Administered 2017-12-09: 3 mL

## 2017-12-09 MED ORDER — METHYLPREDNISOLONE ACETATE 40 MG/ML IJ SUSP
40.0000 mg | INTRAMUSCULAR | Status: AC | PRN
  Administered 2017-12-09: 40 mg via INTRA_ARTICULAR

## 2017-12-09 NOTE — Progress Notes (Signed)
Office Visit Note   Patient: Monica [name]           Date of Birth: 08-27-1960           MRN: [id number] Visit Date: 12/09/2017              Requested by: Unk Pinto, [address], [place] [address] PCP: Unk Pinto, MD   Assessment & Plan: Visit Diagnoses:  1. Chronic pain of right knee   2. Trochanteric bursitis, left hip     Plan: Since her right knee is feeling better overall I do not even recommend an injection for the knee.  If it bothers her in the future she will let us know my next step would be an injection given the well-maintained joint space.  She may eventually need an MRI if it gives her enough problems.  As far as her left hip goes she is never been shown stretching exercises for the trochanteric bursitis no showed her how to do these and she demonstrated them back to me.  I did provide a steroid injection per her wishes of the trochanteric area because is been a year since he had this before.  All question concerns were answered and addressed.  Follow-up will be as needed.  Follow-Up Instructions: Return if symptoms worsen or fail to improve.   Orders:  Orders Placed This Encounter  Procedures  . Large Joint Inj  . XR Knee 1-2 Views Right   No orders of the defined types were placed in this encounter.     Procedures: Large Joint Inj: L greater trochanter on 12/09/2017 9:12 AM Indications: pain and diagnostic evaluation Details: 22 G 1.5 in needle, lateral approach  Arthrogram: No  Medications: 3 mL lidocaine 1 %; 40 mg methylPREDNISolone acetate 40 MG/ML Outcome: tolerated well, no immediate complications Procedure, treatment alternatives, risks and benefits explained, specific risks discussed. Consent was given by the patient. Immediately prior to procedure a time out was called to verify the correct patient, procedure, equipment, support staff and site/side marked as required. Patient was prepped and draped in  the usual sterile fashion.       Clinical Data: No additional findings.   Subjective: Chief Complaint  Patient presents with  . Right Knee - Pain  Patient comes in today for evaluation treatment of acute right knee pain.  When she called 2 weeks ago it is really flared up and hurt on the lateral aspect of her knee with no known injury.  She does have a history of left hip pain and trochanteric bursitis and left hip.  She says now her knee is calming down significantly.  She again points to lateral aspect of her knee as source of her pain when she twists her knee and cross her leg.  Her right hip has had no issues with her left hip has had pain over the trochanteric area.  She had x-rays of this hip last year.  We are x-raying her knee today.  She denies any known injury.  She is not a diabetic.  HPI  Review of Systems She currently denies any headache, chest pain, shortness of breath, fever, chills, nausea, vomiting.  Objective: Vital Signs: LMP 05/15/2010   Physical Exam She is alert and oriented x3 and in no acute distress Ortho Exam Examination of her right knee is basically normal today.  She has a touch of lateral joint line tenderness when you flex her knee past 90 degrees but  a negative McMurray and negative Lockman's exam.  There is no effusion.  Both knees hyperextend.  Examination of her left hip shows that moves fluidly with internal and external rotation with only pain to palpation of the trochanteric area. Specialty Comments:  No specialty comments available.  Imaging: Xr Knee 1-2 Views Right  Result Date: 12/09/2017 An AP and lateral of the right knee show well-maintained joint space with no significant arthritic changes.  There is no effusion or acute findings.    PMFS History: Patient Active Problem List   Diagnosis Date Noted  . B12 deficiency 10/24/2017  . BMI 31.0-31.9,adult 05/20/2017  . Insomnia 05/20/2017  . History of IBS 10/18/2016  . HLA B27 (HLA  B27 positive) 10/04/2016  . Vitamin D deficiency   . Elevated blood pressure reading without diagnosis of hypertension   . Hyperlipidemia   . DDD (degenerative disc disease), cervical   . DDD (degenerative disc disease), lumbar   . Family history of ischemic heart disease 11/28/2012  . History of tobacco abuse 11/28/2012   Past Medical History:  Diagnosis Date  . Anal fissure   . DDD (degenerative disc disease), cervical   . DDD (degenerative disc disease), lumbar   . Hyperlipidemia   . Hypertension    Patient denies.  . IBS (irritable bowel syndrome)   . Lower back pain   . Neck pain   . Plantar fasciitis of left foot   . Plantar fasciitis of right foot   . Unspecified vitamin D deficiency     Family History  Problem Relation Age of Onset  . Heart disease Father   . Diabetes Father   . Hyperlipidemia Father   . Hypertension Father   . Cervical cancer Sister   . Hyperlipidemia Mother   . Hypertension Mother   . Stroke Maternal Aunt   . Cancer Maternal Aunt        breast x 2 different aunts  . Heart disease Maternal Grandmother   . Heart disease Maternal Grandfather   . Colon cancer Neg Hx   . Esophageal cancer Neg Hx   . Pancreatic cancer Neg Hx   . Rectal cancer Neg Hx   . Stomach cancer Neg Hx     Past Surgical History:  Procedure Laterality Date  . CESAREAN SECTION    . lower back     L4 and L5   . NECK SURGERY     metal plate and pins in neck  . PLANTAR FASCIA SURGERY     both feet   Social History   Occupational History  . Occupation: Web designer  Tobacco Use  . Smoking status: Former Smoker    Years: 22.00    Types: Cigarettes    Last attempt to quit: 09/16/2014    Years since quitting: 3.2  . Smokeless tobacco: Never Used  . Tobacco comment: Quit 4 years ago.  Substance and Sexual Activity  . Alcohol use: No    Alcohol/week: 0.0 standard drinks  . Drug use: No  . Sexual activity: Not on file

## 2018-01-01 DIAGNOSIS — Z6833 Body mass index (BMI) 33.0-33.9, adult: Secondary | ICD-10-CM | POA: Diagnosis not present

## 2018-01-01 DIAGNOSIS — M4722 Other spondylosis with radiculopathy, cervical region: Secondary | ICD-10-CM | POA: Diagnosis not present

## 2018-01-01 DIAGNOSIS — M5412 Radiculopathy, cervical region: Secondary | ICD-10-CM | POA: Diagnosis not present

## 2018-01-15 HISTORY — PX: CATARACT EXTRACTION, BILATERAL: SHX1313

## 2018-02-12 ENCOUNTER — Ambulatory Visit (INDEPENDENT_AMBULATORY_CARE_PROVIDER_SITE_OTHER): Payer: Managed Care, Other (non HMO) | Admitting: Orthopaedic Surgery

## 2018-02-12 ENCOUNTER — Encounter (INDEPENDENT_AMBULATORY_CARE_PROVIDER_SITE_OTHER): Payer: Self-pay | Admitting: Orthopaedic Surgery

## 2018-02-12 DIAGNOSIS — M25561 Pain in right knee: Secondary | ICD-10-CM

## 2018-02-12 MED ORDER — METHYLPREDNISOLONE ACETATE 40 MG/ML IJ SUSP
40.0000 mg | INTRAMUSCULAR | Status: AC | PRN
  Administered 2018-02-12: 40 mg via INTRA_ARTICULAR

## 2018-02-12 MED ORDER — LIDOCAINE HCL 1 % IJ SOLN
3.0000 mL | INTRAMUSCULAR | Status: AC | PRN
  Administered 2018-02-12: 3 mL

## 2018-02-12 NOTE — Progress Notes (Signed)
 Office Visit Note   Patient: Monica [name]           Date of Birth: 04/13/1960           MRN: [id number] Visit Date: 02/12/2018              Requested by: McKeown, William, MD [address] [place], [place] [address] PCP: McKeown, William, MD   Assessment & Plan: Visit Diagnoses:  1. Right knee pain, unspecified chronicity     Plan: We will have her ice the knee during the next several days at least twice daily for 15 to 20 minutes.  Also begin taking Aleve 2 tablets twice daily.  She is shown quad strengthening exercises which she can perform.  See her back in 2 weeks check her response to the injection for reexamination.  Questions were encouraged and answered by Dr. Blackman and myself.  Follow-Up Instructions: Return in about 2 weeks (around 02/26/2018).   Orders:  Orders Placed This Encounter  Procedures  . Large Joint Inj   No orders of the defined types were placed in this encounter.     Procedures: Large Joint Inj on 02/12/2018 9:15 AM Indications: pain Details: 22 G 1.5 in needle, anterolateral approach  Arthrogram: No  Medications: 3 mL lidocaine 1 %; 40 mg methylPREDNISolone acetate 40 MG/ML Outcome: tolerated well, no immediate complications Procedure, treatment alternatives, risks and benefits explained, specific risks discussed. Consent was given by the patient. Immediately prior to procedure a time out was called to verify the correct patient, procedure, equipment, support staff and site/side marked as required. Patient was prepped and draped in the usual sterile fashion.       Clinical Data: No additional findings.   Subjective: Chief Complaint  Patient presents with  . Right Knee - Follow-up    HPI [name] is a 58-year-old female who is well-known to our department service comes in today with acute on chronic knee pain of the right knee.  She reports that she was stepping up a ladder had a painful pop in her right  knee.  She states the pop was audible.  She had some pain in the knee prior to this felt as if the knee needed pop.  Since that time she has had increased pain in the knee sensation in the knee may give way but it has not given way.  Otherwise no mechanical symptoms.  She did not have a fall or any other injury at the time of the knee popping.  Radiographs of the knee performed 12/09/2017 and are reviewed today again show a well-preserved right knee.  She did have a trochanteric injection on the left 12/09/2017 states that left hip is doing well. Review of Systems Please se HPI otherwise negative  Objective: Vital Signs: LMP 05/15/2010   Physical Exam Constitutional:      Appearance: She is not ill-appearing or diaphoretic.  Pulmonary:     Effort: Pulmonary effort is normal.  Neurological:     Mental Status: She is alert and oriented to person, place, and time.  Psychiatric:        Mood and Affect: Mood normal.        Behavior: Behavior normal.     Ortho Exam Bilateral knees full extension flexion to approximately 110 degrees.  No instability valgus varus stressing of either knee.  Tenderness along the medial joint line of the right knee only.  McMurray's positive on the right negative left.  No effusion   abnormal warmth or erythema of either knee. Specialty Comments:  No specialty comments available.  Imaging: No results found.   PMFS History: Patient Active Problem List   Diagnosis Date Noted  . B12 deficiency 10/24/2017  . BMI 31.0-31.9,adult 05/20/2017  . Insomnia 05/20/2017  . History of IBS 10/18/2016  . HLA B27 (HLA B27 positive) 10/04/2016  . Vitamin D deficiency   . Elevated blood pressure reading without diagnosis of hypertension   . Hyperlipidemia   . DDD (degenerative disc disease), cervical   . DDD (degenerative disc disease), lumbar   . Family history of ischemic heart disease 11/28/2012  . History of tobacco abuse 11/28/2012   Past Medical History:    Diagnosis Date  . Anal fissure   . DDD (degenerative disc disease), cervical   . DDD (degenerative disc disease), lumbar   . Hyperlipidemia   . Hypertension    Patient denies.  . IBS (irritable bowel syndrome)   . Lower back pain   . Neck pain   . Plantar fasciitis of left foot   . Plantar fasciitis of right foot   . Unspecified vitamin D deficiency     Family History  Problem Relation Age of Onset  . Heart disease Father   . Diabetes Father   . Hyperlipidemia Father   . Hypertension Father   . Cervical cancer Sister   . Hyperlipidemia Mother   . Hypertension Mother   . Stroke Maternal Aunt   . Cancer Maternal Aunt        breast x 2 different aunts  . Heart disease Maternal Grandmother   . Heart disease Maternal Grandfather   . Colon cancer Neg Hx   . Esophageal cancer Neg Hx   . Pancreatic cancer Neg Hx   . Rectal cancer Neg Hx   . Stomach cancer Neg Hx     Past Surgical History:  Procedure Laterality Date  . CESAREAN SECTION    . lower back     L4 and L5   . NECK SURGERY     metal plate and pins in neck  . PLANTAR FASCIA SURGERY     both feet   Social History   Occupational History  . Occupation: administrative assistant  Tobacco Use  . Smoking status: Former Smoker    Years: 22.00    Types: Cigarettes    Last attempt to quit: 09/16/2014    Years since quitting: 3.4  . Smokeless tobacco: Never Used  . Tobacco comment: Quit 4 years ago.  Substance and Sexual Activity  . Alcohol use: No    Alcohol/week: 0.0 standard drinks  . Drug use: No  . Sexual activity: Not on file       

## 2018-02-13 ENCOUNTER — Telehealth (INDEPENDENT_AMBULATORY_CARE_PROVIDER_SITE_OTHER): Payer: Self-pay | Admitting: Orthopaedic Surgery

## 2018-02-13 NOTE — Telephone Encounter (Signed)
I'm fine with her having a work note stating whatever she needs it to say.

## 2018-02-13 NOTE — Telephone Encounter (Signed)
See below

## 2018-02-13 NOTE — Telephone Encounter (Signed)
Patient called needing a note for work. 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 Ninfa Linden want her to go to work until she is better. Patient said she does a lot of bending,stumping and she is on her feet for 8 hours.. Patient said she works every Saturday and Sunday. Patient asked if the note can be emailed to her. The e-mail address is [email or web address]. The number to contact patient is  [phone]

## 2018-02-14 ENCOUNTER — Telehealth (INDEPENDENT_AMBULATORY_CARE_PROVIDER_SITE_OTHER): Payer: Self-pay | Admitting: Orthopaedic Surgery

## 2018-02-14 ENCOUNTER — Encounter (INDEPENDENT_AMBULATORY_CARE_PROVIDER_SITE_OTHER): Payer: Self-pay

## 2018-02-14 NOTE — Telephone Encounter (Signed)
Patient wants to now when can she get letter faxed to her. She wanted to speak to someone before it was emailed.  Please call her [phone]

## 2018-02-14 NOTE — Telephone Encounter (Signed)
Note emailed to her

## 2018-02-26 ENCOUNTER — Other Ambulatory Visit (INDEPENDENT_AMBULATORY_CARE_PROVIDER_SITE_OTHER): Payer: Self-pay

## 2018-02-26 ENCOUNTER — Ambulatory Visit (INDEPENDENT_AMBULATORY_CARE_PROVIDER_SITE_OTHER): Payer: Managed Care, Other (non HMO) | Admitting: Orthopaedic Surgery

## 2018-02-26 ENCOUNTER — Encounter (INDEPENDENT_AMBULATORY_CARE_PROVIDER_SITE_OTHER): Payer: Self-pay | Admitting: Orthopaedic Surgery

## 2018-02-26 DIAGNOSIS — M25561 Pain in right knee: Secondary | ICD-10-CM | POA: Diagnosis not present

## 2018-02-26 DIAGNOSIS — G8929 Other chronic pain: Secondary | ICD-10-CM

## 2018-02-26 NOTE — Progress Notes (Signed)
  The patient is continue to follow-up for her right knee with continued symptoms of locking and catching.  We have tried activity modification and anti-inflammatories.  She is work on Forensic scientist exercises and is taken anti-inflammatories.  We have been provided a steroid injection in her knee.  She still hurts along the medial joint line of her knee continues to have locking catching and symptoms of instability.  Examination of her right knee there is no effusion.  She has significant medial joint line tenderness over the medial collateral ligament and medial tibial plateau and a positive McMurray sign to the medial compartment.  This point an MRI is warranted to rule out a meniscal tear or tear to the MCL.  We will place her in a hinged knee brace as well.  Her recent steroid injection temporize her symptoms in terms of pain for just a few days but now it is come back but she is more concerned about mechanical symptoms.  This all originated from a twisting injury to her knee which she felt a pop.  We will see her back in 2 weeks hopefully go over an MRI of her right knee.  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  All questions concerns were answered and addressed.

## 2018-03-02 ENCOUNTER — Ambulatory Visit
Admission: RE | Admit: 2018-03-02 | Discharge: 2018-03-02 | Disposition: A | Discharge status: Home / Self Care | Payer: BLUE CROSS/BLUE SHIELD | Source: Ambulatory Visit | Attending: Orthopaedic Surgery | Admitting: Orthopaedic Surgery

## 2018-03-02 DIAGNOSIS — G8929 Other chronic pain: Secondary | ICD-10-CM

## 2018-03-02 DIAGNOSIS — M25561 Pain in right knee: Principal | ICD-10-CM

## 2018-03-03 ENCOUNTER — Encounter (INDEPENDENT_AMBULATORY_CARE_PROVIDER_SITE_OTHER): Payer: Self-pay | Admitting: Physician Assistant

## 2018-03-03 ENCOUNTER — Ambulatory Visit (INDEPENDENT_AMBULATORY_CARE_PROVIDER_SITE_OTHER): Payer: Managed Care, Other (non HMO) | Admitting: Physician Assistant

## 2018-03-03 DIAGNOSIS — M25561 Pain in right knee: Secondary | ICD-10-CM

## 2018-03-03 NOTE — Progress Notes (Signed)
HPI: Mrs.: Returns today to go over the MRI of her right knee.  She continues to have pain in the knee but overall is slightly improved.  She is having no true mechanical symptoms in the knee.  She is been doing home exercises shown at previous visit.  MRI dated 03/02/2018 right knee is reviewed with the patient actual images reviewed with the patient today.Knee MRI showed a large radial tear at the root of the posterior horn of the medial meniscus.  Mild to moderate chondral thinning at the medial compartment with subcortical marrow edema along the medial tibial plateau.  Small knee effusion with Baker's cyst present.  Thin medial plica was also noted.  Physical exam: Right knee good range of motion.  Tenderness along medial joint line.  No effusion.  Impression: Right knee mild to moderate medial compartmental changes with subtle cortical marrow edema.  Posterior root radial tear  Plan: Keep patient out of work for the next 3 weeks.  We will see her back in 3 weeks to check her progress.  We will have her take Aleve 2 tablets twice daily with food.  Also she will start turmeric.  Due to the fact that she does not have any mechanical symptoms not sure that a knee arthroscopy would benefit her.  However if she develops mechanical symptoms or pain persist we may consider arthroscopic intervention.

## 2018-03-24 ENCOUNTER — Ambulatory Visit (INDEPENDENT_AMBULATORY_CARE_PROVIDER_SITE_OTHER): Payer: Managed Care, Other (non HMO) | Admitting: Physician Assistant

## 2018-03-25 ENCOUNTER — Ambulatory Visit (INDEPENDENT_AMBULATORY_CARE_PROVIDER_SITE_OTHER): Payer: Managed Care, Other (non HMO) | Admitting: Orthopaedic Surgery

## 2018-03-25 ENCOUNTER — Telehealth (INDEPENDENT_AMBULATORY_CARE_PROVIDER_SITE_OTHER): Payer: Self-pay

## 2018-03-25 ENCOUNTER — Encounter (INDEPENDENT_AMBULATORY_CARE_PROVIDER_SITE_OTHER): Payer: Self-pay | Admitting: Orthopaedic Surgery

## 2018-03-25 DIAGNOSIS — M25561 Pain in right knee: Secondary | ICD-10-CM

## 2018-03-25 NOTE — Progress Notes (Signed)
HPI: [name] returns today follow-up of her right knee.  She states she has good days and bad days.  Pain mostly medial aspect of the knee.  Again she underwent an MRI of her right knee which showed a large radial tear at the medial root of the meniscus, mild to moderate chondromalacia involving the medial compartment and subchondral marrow edema of the medial tibial plateau.  There is also a plica that was noted.  She is still having no mechanical symptoms of the knee.  Review of systems: Please see HPI otherwise negative  Physical exam: Right knee full extension full flexion.  No instability valgus varus stressing.  She has slight tenderness along the anterior medial joint line.  No effusion abnormal warmth.  McMurray's is negative.  Impression: Right knee pain with mild to moderate chondromalacia medial compartment  Plan: Due to the fact that she continues to have pain although it is becoming less and the fact that she has no mechanical symptoms would not recommend knee arthroscopy at this point time.  Like to try a supplemental injection see if this helps with her knee pain.  Should continue to do quad strengthening.  She will follow-up with [place] once this injection is available.  She has been back at work over the weekend and feels that this may have flared her knee up some.  Questions encouraged and answered at length

## 2018-03-25 NOTE — Telephone Encounter (Signed)
Right knee monovisc gel injection

## 2018-03-26 NOTE — Telephone Encounter (Signed)
[employer], Monovisc.

## 2018-03-27 NOTE — Telephone Encounter (Signed)
Designer, television/film set for VOB.

## 2018-03-31 ENCOUNTER — Telehealth (INDEPENDENT_AMBULATORY_CARE_PROVIDER_SITE_OTHER): Payer: Self-pay

## 2018-03-31 NOTE — Telephone Encounter (Signed)
PA required for Monovisc,bilateral knee. Faxed completed PA form to [employer] at [phone]. 

## 2018-03-31 NOTE — Telephone Encounter (Signed)
Wendy M.submitted VOB for Monovisc, bilateral knee, 03/28/2018 due to Durolane not being a preferred product for [employer].

## 2018-04-01 ENCOUNTER — Telehealth (INDEPENDENT_AMBULATORY_CARE_PROVIDER_SITE_OTHER): Payer: Self-pay

## 2018-04-01 NOTE — Telephone Encounter (Signed)
Called and left a VM advising patient to CB and schedule an appointment for gel injection with Dr. Ninfa Linden or Artis Delay.

## 2018-04-01 NOTE — Telephone Encounter (Signed)
Patient is approved for Monovisc, bilateral knee. [place] After the deductible has been met, the patient will be responsible for 10% of the allowable amount. No Co-pay PA required PA Approval# [id number] Valid 03/31/2018- 04/28/2018

## 2018-04-03 ENCOUNTER — Telehealth (INDEPENDENT_AMBULATORY_CARE_PROVIDER_SITE_OTHER): Payer: Self-pay | Admitting: Orthopaedic Surgery

## 2018-04-03 NOTE — Telephone Encounter (Signed)
Talked with patient concerning appointment for gel injection.  Patient has appointment for 04/24/2018 with Dr. Ninfa Linden.

## 2018-04-03 NOTE — Telephone Encounter (Signed)
Patient returned call left voicemail for a return call. The number to contact patient is [phone] or after 5:00pm [phone]

## 2018-04-23 ENCOUNTER — Ambulatory Visit (INDEPENDENT_AMBULATORY_CARE_PROVIDER_SITE_OTHER): Payer: Managed Care, Other (non HMO) | Admitting: Orthopaedic Surgery

## 2018-04-23 ENCOUNTER — Telehealth (INDEPENDENT_AMBULATORY_CARE_PROVIDER_SITE_OTHER): Payer: Self-pay

## 2018-04-23 NOTE — Telephone Encounter (Signed)
Called and left message with patient to call back and confirm appointment tomorrow and to answer our screening questions

## 2018-04-24 ENCOUNTER — Other Ambulatory Visit: Payer: Self-pay

## 2018-04-24 ENCOUNTER — Encounter (INDEPENDENT_AMBULATORY_CARE_PROVIDER_SITE_OTHER): Payer: Self-pay | Admitting: Orthopaedic Surgery

## 2018-04-24 ENCOUNTER — Ambulatory Visit (INDEPENDENT_AMBULATORY_CARE_PROVIDER_SITE_OTHER): Payer: Managed Care, Other (non HMO) | Admitting: Orthopaedic Surgery

## 2018-04-24 ENCOUNTER — Telehealth (INDEPENDENT_AMBULATORY_CARE_PROVIDER_SITE_OTHER): Payer: Self-pay | Admitting: Orthopaedic Surgery

## 2018-04-24 DIAGNOSIS — M1711 Unilateral primary osteoarthritis, right knee: Secondary | ICD-10-CM

## 2018-04-24 DIAGNOSIS — M25561 Pain in right knee: Secondary | ICD-10-CM

## 2018-04-24 MED ORDER — HYALURONAN 88 MG/4ML IX SOSY
88.0000 mg | PREFILLED_SYRINGE | INTRA_ARTICULAR | Status: AC | PRN
  Administered 2018-04-24: 88 mg via INTRA_ARTICULAR

## 2018-04-24 NOTE — Progress Notes (Signed)
   Procedure Note  Patient: Monica [name]             Date of Birth: 01-Jan-1961           MRN: [id number]             Visit Date: 04/24/2018   HPI: Monica [name] returns today for Monovisc injection right knee.  She states she has had no new injury to the knee.  She does note she had occasional episodes of giving way but no catching locking.  She had no falls due to the knee pain.  Notes that she has good and bad days in regards to the pain that she is having in the right knee.  Physical exam: Right knee no effusion abnormal warmth erythema.  She has overall good range of motion of the knee.  Procedures: Visit Diagnoses: Acute pain of right knee  Large Joint Inj: R knee on 04/24/2018 8:32 AM Indications: pain Details: 22 G 1.5 in needle, anterolateral approach  Arthrogram: No  Medications: 88 mg Hyaluronan 88 MG/4ML Outcome: tolerated well, no immediate complications Procedure, treatment alternatives, risks and benefits explained, specific risks discussed. Consent was given by the patient. Immediately prior to procedure a time out was called to verify the correct patient, procedure, equipment, support staff and site/side marked as required. Patient was prepped and draped in the usual sterile fashion.    Plan: Follow up as needed. She understands that the injection may take up to 6 weeks to begin working.

## 2018-04-24 NOTE — Telephone Encounter (Signed)
Patient aware the soreness is normal to just ice and elevate

## 2018-04-24 NOTE — Telephone Encounter (Signed)
Patient had a Gel injection this morning and is now having trouble straightening her leg when she is standing up from a seated position.  CB#[phone].  Thank you.

## 2018-04-25 ENCOUNTER — Ambulatory Visit: Payer: Self-pay | Admitting: Physician Assistant

## 2018-04-28 NOTE — Progress Notes (Signed)
Assessment and Plan:   Elevated blood pressure reading without diagnosis of hypertension - continue medications, DASH diet, exercise and monitor at home. Call if greater than 130/80.  -     CBC with Differential/Platelet -     COMPLETE METABOLIC PANEL WITH GFR -     TSH  Hyperlipidemia, unspecified hyperlipidemia type check lipids decrease fatty foods increase activity.  -     Lipid panel  obesity -     phentermine (ADIPEX-P) 37.5 MG tablet; Take 1 tablet (37.5 mg total) by mouth daily before breakfast. Will call in 1 month to check on progress  B12 deficiency -     Ferritin -     Vitamin B12 -     Iron,Total/Total Iron Binding Cap  Vitamin D deficiency -     VITAMIN D 25 Hydroxy (Vit-D Deficiency, Fractures)  Abnormal glucose -     Hemoglobin A1c Discussed disease progression and risks Discussed diet/exercise, weight management and risk modification  Medication management -     Magnesium   Continue diet and meds as discussed. Further disposition pending results of labs. Over 30 minutes of exam, counseling, chart review, and critical decision making was performed  Future Appointments  Date Time Provider [place]  11/03/2018  9:00 AM Vicie Mutters, PA-C [facility] None     HPI 58 y.o. female  presents for 6 month follow up on hypertension, cholesterol, prediabetes, and vitamin D deficiency.   She is working 7 days a week, Education officer, environmental at work. Her husband hurt his eye in Jan, lots of stress at work, she has turned to food for comfort. Going to be a grandmother of baby in Oct. She is craving pretzels and sweets some. She has tried phentermine in the past and did well with it. tried topamax but she was complaining of worsening crying spells. She was started on naltrexone but states she is having AM headaches.   BMI is Body mass index is 33.19 kg/m., she is working on diet and exercise. She only drinks water.  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  She states she has severe fatigue.  Has never had sleep study.  Wt Readings from Last 3 Encounters:  04/30/18 196 lb 6.4 oz (89.1 kg)  10/29/17 188 lb (85.3 kg)  10/24/17 187 lb 12.8 oz (85.2 kg)   Her blood pressure has been controlled at home, today their BP is BP: 126/80   She does not workout. She denies chest pain, shortness of breath, dizziness.  She is not on cholesterol medication and denies myalgias. Her cholesterol is at goal. The cholesterol last visit was:   Lab Results  Component Value Date   CHOL 184 10/24/2017   HDL 62 10/24/2017   LDLCALC 105 (H) 10/24/2017   TRIG 81 10/24/2017   CHOLHDL 3.0 10/24/2017   Last A1C in the office was:  Lab Results  Component Value Date   HGBA1C 5.4 07/04/2016   Patient is on Vitamin D supplement.   Lab Results  Component Value Date   VD25OH 27 (L) 10/24/2017      Current Medications:  Current Outpatient Medications on File Prior to Visit  Medication Sig Dispense Refill  . acyclovir (ZOVIRAX) 800 MG tablet Take 1 tablet (800 mg total) by mouth daily as needed. 90 tablet 1  . clobetasol ointment (TEMOVATE) 2.40 % Apply 1 application topically 2 (two) times daily as needed.    Marland Kitchen  HYDROcodone-Acetaminophen (NORCO PO) Take by mouth as needed.    . meclizine (ANTIVERT) 25 MG tablet Take 25 mg by mouth 3 (three) times daily as needed for dizziness.    . metroNIDAZOLE (METROCREAM) 0.75 % cream Apply topically 2 (two) times daily. 60 g 1   No current facility-administered medications on file prior to visit.    Medical History:  Past Medical History:  Diagnosis Date  . Anal fissure   . DDD (degenerative disc disease), cervical   . DDD (degenerative disc disease), lumbar   . Hyperlipidemia   . Hypertension    Patient denies.  . IBS (irritable bowel syndrome)   . Lower back pain   . Neck pain   . Plantar fasciitis of left foot   . Plantar fasciitis of right foot   . Unspecified vitamin D  deficiency    Allergies:  Allergies  Allergen Reactions  . Aspirin Other (See Comments)    GI upset  . Mobic [Meloxicam] Other (See Comments)    GI upset  . Wellbutrin [Bupropion] Other (See Comments)    headache  . Z-Pak [Azithromycin] Other (See Comments)    thrush  . Penicillins Rash     Review of Systems:  Review of Systems  Constitutional: Positive for malaise/fatigue. Negative for chills, diaphoresis, fever and weight loss.  HENT: Negative.   Eyes: Negative.   Respiratory: Negative.   Cardiovascular: Negative.   Gastrointestinal: Negative.   Genitourinary: Negative.   Musculoskeletal: Negative.   Skin: Negative.   Neurological: Negative.  Negative for weakness.  Endo/Heme/Allergies: Negative.   Psychiatric/Behavioral: Negative.     Family history- Review and unchanged Social history- Review and unchanged Physical Exam: BP 126/80   Pulse 65   Temp (!) 97.3 F (36.3 C)   Ht 5' 4.5" (1.638 m)   Wt 196 lb 6.4 oz (89.1 kg)   LMP 05/15/2010   SpO2 98%   BMI 33.19 kg/m  Wt Readings from Last 3 Encounters:  04/30/18 196 lb 6.4 oz (89.1 kg)  10/29/17 188 lb (85.3 kg)  10/24/17 187 lb 12.8 oz (85.2 kg)   General Appearance: Well nourished, in no apparent distress. Eyes: PERRLA, EOMs, conjunctiva no swelling or erythema Sinuses: No Frontal/maxillary tenderness ENT/Mouth: Ext aud canals clear, TMs without erythema, bulging. No erythema, swelling, or exudate on post pharynx.  Tonsils not swollen or erythematous. Hearing normal.  Neck: Supple, thyroid normal.  Respiratory: Respiratory effort normal, BS equal bilaterally without rales, rhonchi, wheezing or stridor.  Cardio: RRR with no MRGs. Brisk peripheral pulses without edema.  Abdomen: Soft, + BS,  Non tender, no guarding, rebound, hernias, masses. Lymphatics: Non tender without lymphadenopathy.  Musculoskeletal: Full ROM, 5/5 strength, Normal gait Skin: Warm, dry without rashes, lesions, ecchymosis.  Neuro:  Cranial nerves intact. Normal muscle tone, no cerebellar symptoms. Psych: Awake and oriented X 3, normal affect, Insight and Judgment appropriate.    Vicie Mutters, PA-C 8:54 AM [facility]

## 2018-04-30 ENCOUNTER — Encounter: Payer: Self-pay | Admitting: Physician Assistant

## 2018-04-30 ENCOUNTER — Other Ambulatory Visit: Payer: Self-pay

## 2018-04-30 ENCOUNTER — Ambulatory Visit: Payer: Managed Care, Other (non HMO) | Admitting: Physician Assistant

## 2018-04-30 VITALS — BP 126/80 | HR 65 | Temp 97.3°F | Ht 64.5 in | Wt 196.4 lb

## 2018-04-30 DIAGNOSIS — E559 Vitamin D deficiency, unspecified: Secondary | ICD-10-CM

## 2018-04-30 DIAGNOSIS — E785 Hyperlipidemia, unspecified: Secondary | ICD-10-CM | POA: Diagnosis not present

## 2018-04-30 DIAGNOSIS — R7309 Other abnormal glucose: Secondary | ICD-10-CM | POA: Diagnosis not present

## 2018-04-30 DIAGNOSIS — Z79899 Other long term (current) drug therapy: Secondary | ICD-10-CM

## 2018-04-30 DIAGNOSIS — Z6831 Body mass index (BMI) 31.0-31.9, adult: Secondary | ICD-10-CM

## 2018-04-30 DIAGNOSIS — E538 Deficiency of other specified B group vitamins: Secondary | ICD-10-CM | POA: Diagnosis not present

## 2018-04-30 DIAGNOSIS — R030 Elevated blood-pressure reading, without diagnosis of hypertension: Secondary | ICD-10-CM

## 2018-04-30 MED ORDER — PHENTERMINE HCL 37.5 MG PO TABS: 37.5000 mg | ORAL_TABLET | Freq: Every day | ORAL | 2 refills | Status: DC

## 2018-04-30 NOTE — Patient Instructions (Signed)
Phentermine  While taking the medication we may ask that you come into the office once a month for the first month for a blood pressure check and EKG.   Also please bring in a food log for that visit to review. It is helpful if you bring in a food diary or use an app on your phone such as myfitnesspal to record your calorie intake, especially in the beginning. BRING FOR YOUR FIRST VISIT.  After that first initial visit, we will want to see you once every 2-3 months to monitor your weight, blood pressure, and heart rate.   In addition we can help answer your questions about diet, exercise, and help you every step of the way with your weight loss journey.  You can start out on 1/3 to 1/2 a pill in the morning and if you are tolerating it well you can increase to one pill daily. I also have some patients that take 1/3 or 1/2 at lunch to help prevent night time eating.  This medication is cheapest CASH pay at [facility] is 14-17 dollars and you do NOT need a membership to get meds from there.   It causes dry mouth and constipation in almost every patient, so try to get 80-100 oz of water a day and increase fiber such as veggies. You can add on a stool softener if you would like.   It can give you energy however it can also cause some people to be shaky, anxious or have palpitations. Stop this medication if that happens and contact the office.   If this medication does not work for you there are several medications that we can try to help rewire your brain in addition to making healthier habits.   What is this medicine? PHENTERMINE (FEN ter meen) decreases your appetite. This medicine is intended to be used in addition to a healthy reduced calorie diet and exercise. The best results are achieved this way. This medicine is only indicated for short-term use. Eventually your weight loss may level out and the medication will no longer be needed.   How should I use this medicine?  Take this medicine by mouth. Follow the directions on the prescription label. The tablets should stay in the bottle until immediately before you take your dose. Take your doses at regular intervals. Do not take your medicine more often than directed.  Overdosage: If you think you have taken too much of this medicine contact a poison control center or emergency room at once. NOTE: This medicine is only for you. Do not share this medicine with others.  What if I miss a dose? If you miss a dose, take it as soon as you can. If it is almost time for your next dose, take only that dose. Do not take double or extra doses. Do not increase or in any way change your dose without consulting your doctor.  What should I watch for while using this medicine? Notify your physician immediately if you become short of breath while doing your normal activities. Do not take this medicine within 6 hours of bedtime. It can keep you from getting to sleep. Avoid drinks that contain caffeine and try to stick to a regular bedtime every night. Do not stand or sit up quickly, especially if you are an older patient. This reduces the risk of dizzy or fainting spells. Avoid alcoholic drinks.  What side effects may I notice from receiving this medicine? Side effects that  you should report to your doctor or health care professional as soon as possible: -chest pain, palpitations -depression or severe changes in mood -increased blood pressure -irritability -nervousness or restlessness -severe dizziness -shortness of breath -problems urinating -unusual swelling of the legs -vomiting  Side effects that usually do not require medical attention (report to your doctor or health care professional if they continue or are bothersome): -blurred vision or other eye problems -changes in sexual ability or desire -constipation or diarrhea -difficulty sleeping -dry mouth or unpleasant taste -headache -nausea This list may not  describe all possible side effects. Call your doctor for medical advice about side effects. You may report side effects to FDA at 1-800-FDA-1088.  Are you an emotional eater? Do you eat more when you're feeling stressed? Do you eat when you're not hungry or when you're full? Do you eat to feel better (to calm and soothe yourself when you're sad, mad, bored, anxious, etc.)? Do you reward yourself with food? Do you regularly eat until you've stuffed yourself? Does food make you feel safe? Do you feel like food is a friend? Do you feel powerless or out of control around food?  If you answered yes to some of these questions than it is likely that you are an emotional eater. This is normally a learned behavior and can take time to first recognize the signs and second BREAK THE HABIT. But here is more information and tips to help.   The difference between emotional hunger and physical hunger Emotional hunger can be powerful, so it's easy to mistake it for physical hunger. But there are clues you can look for to help you tell physical and emotional hunger apart.  Emotional hunger comes on suddenly. It hits you in an instant and feels overwhelming and urgent. Physical hunger, on the other hand, comes on more gradually. The urge to eat doesn't feel as dire or demand instant satisfaction (unless you haven't eaten for a very long time).  Emotional hunger craves specific comfort foods. When you're physically hungry, almost anything sounds good-including healthy stuff like vegetables. But emotional hunger craves junk food or sugary snacks that provide an instant rush. You feel like you need cheesecake or pizza, and nothing else will do.  Emotional hunger often leads to mindless eating. Before you know it, you've eaten a whole bag of chips or an entire pint of ice cream without really paying attention or fully enjoying it. When you're eating in response to physical hunger, you're typically more aware of what  you're doing.  Emotional hunger isn't satisfied once you're full. You keep wanting more and more, often eating until you're uncomfortably stuffed. Physical hunger, on the other hand, doesn't need to be stuffed. You feel satisfied when your stomach is full.  Emotional hunger isn't located in the stomach. Rather than a growling belly or a pang in your stomach, you feel your hunger as a craving you can't get out of your head. You're focused on specific textures, tastes, and smells.  Emotional hunger often leads to regret, guilt, or shame. When you eat to satisfy physical hunger, you're unlikely to feel guilty or ashamed because you're simply giving your body what it needs. If you feel guilty after you eat, it's likely because you know deep down that you're not eating for nutritional reasons.  Identify your emotional eating triggers What situations, places, or feelings make you reach for the comfort of food? Most emotional eating is linked to unpleasant feelings, but it can also  be triggered by positive emotions, such as rewarding yourself for achieving a goal or celebrating a holiday or happy event. Common causes of emotional eating include:  Stuffing emotions - Eating can be a way to temporarily silence or "stuff down" uncomfortable emotions, including anger, fear, sadness, anxiety, loneliness, resentment, and shame. While you're numbing yourself with food, you can avoid the difficult emotions you'd rather not feel.  Boredom or feelings of emptiness - Do you ever eat simply to give yourself something to do, to relieve boredom, or as a way to fill a void in your life? You feel unfulfilled and empty, and food is a way to occupy your mouth and your time. In the moment, it fills you up and distracts you from underlying feelings of purposelessness and dissatisfaction with your life.  Childhood habits - Think back to your childhood memories of food. Did your parents reward good behavior with ice cream, take  you out for pizza when you got a good report card, or serve you sweets when you were feeling sad? These habits can often carry over into adulthood. Or your eating may be driven by nostalgia-for cherished memories of grilling burgers in the backyard with your dad or baking and eating cookies with your mom.  Social influences - Getting together with other people for a meal is a great way to relieve stress, but it can also lead to overeating. It's easy to overindulge simply because the food is there or because everyone else is eating. You may also overeat in social situations out of nervousness. Or perhaps your family or circle of friends encourages you to overeat, and it's easier to go along with the group.  Stress - Ever notice how stress makes you hungry? It's not just in your mind. When stress is chronic, as it so often is in our chaotic, fast-paced world, your body produces high levels of the stress hormone, cortisol. Cortisol triggers cravings for salty, sweet, and fried foods-foods that give you a burst of energy and pleasure. The more uncontrolled stress in your life, the more likely you are to turn to food for emotional relief.  Find other ways to feed your feelings If you don't know how to manage your emotions in a way that doesn't involve food, you won't be able to control your eating habits for very long. Diets so often fail because they offer logical nutritional advice which only works if you have conscious control over your eating habits. It doesn't work when emotions hijack the process, demanding an immediate payoff with food.  In order to stop emotional eating, you have to find other ways to fulfill yourself emotionally. It's not enough to understand the cycle of emotional eating or even to understand your triggers, although that's a huge first step. You need alternatives to food that you can turn to for emotional fulfillment.  Alternatives to emotional eating If you're depressed or lonely,  call someone who always makes you feel better, play with your dog or cat, or look at a favorite photo or cherished memento.  If you're anxious, expend your nervous energy by dancing to your favorite song, squeezing a stress ball, or taking a brisk walk.  If you're exhausted, treat yourself with a hot cup of tea, take a bath, light some scented candles, or wrap yourself in a warm blanket.  If you're bored, read a good book, watch a comedy show, explore the outdoors, or turn to an activity you enjoy (woodworking, playing the guitar, shooting hoops,  scrapbooking, etc.).  What is mindful eating? Mindful eating is a practice that develops your awareness of eating habits and allows you to pause between your triggers and your actions. Most emotional eaters feel powerless over their food cravings. When the urge to eat hits, you feel an almost unbearable tension that demands to be fed, right now. Because you've tried to resist in the past and failed, you believe that your willpower just isn't up to snuff. But the truth is that you have more power over your cravings than you think.  Take 5 before you give in to a craving Emotional eating tends to be automatic and virtually mindless. Before you even realize what you're doing, you've reached for a tub of ice cream and polished off half of it. But if you can take a moment to pause and reflect when you're hit with a craving, you give yourself the opportunity to make a different decision.  Can you put off eating for five minutes? Or just start with one minute. Don't tell yourself you can't give in to the craving; remember, the forbidden is extremely tempting. Just tell yourself to wait.  While you're waiting, check in with yourself. How are you feeling? What's going on emotionally? Even if you end up eating, you'll have a better understanding of why you did it. This can help you set yourself up for a different response next time.  How to practice mindful eating  Eating while you're also doing other things-such as watching TV, driving, or playing with your phone-can prevent you from fully enjoying your food. Since your mind is elsewhere, you may not feel satisfied or continue eating even though you're no longer hungry. Eating more mindfully can help focus your mind on your food and the pleasure of a meal and curb overeating.   Eat your meals in a calm place with no distractions, aside from any dining companions.  Try eating with your non-dominant hand or using chopsticks instead of a knife and fork. Eating in such a non-familiar way can slow down how fast you eat and ensure your mind stays focused on your food.  Allow yourself enough time not to have to rush your meal. Set a timer for 20 minutes and pace yourself so you spend at least that much time eating.  Take small bites and chew them well, taking time to notice the different flavors and textures of each mouthful.  Put your utensils down between bites. Take time to consider how you feel-hungry, satiated-before picking up your utensils again.  Try to stop eating before you are full.It takes time for the signal to reach your brain that you've had enough. Don't feel obligated to always clean your plate.  When you've finished your food, take a few moments to assess if you're really still hungry before opting for an extra serving or dessert.  Learn to accept your feelings-even the bad ones  While it may seem that the core problem is that you're powerless over food, emotional eating actually stems from feeling powerless over your emotions. You don't feel capable of dealing with your feelings head on, so you avoid them with food.  Recommended reading  Mini Habits for weight loss  Healthy Eating: A guide to the new nutrition - [place] Report  10 Tips for Mindful Eating - How mindfulness can help you fully enjoy a meal and the experience of eating-with moderation and  restraint. ([place] blog)  Weight Loss: Gain Control of  Emotional Eating - Tips to regain control of your eating habits. [facility])  Why Stress Causes People to Overeat -Tips on controlling stress eating. ([place])  Mindful Eating Meditations -Free online mindfulness meditations. ([facility])

## 2018-05-01 LAB — CBC WITH DIFFERENTIAL/PLATELET
Absolute Monocytes: 530 cells/uL (ref 200–950)
Basophils Absolute: 39 cells/uL (ref 0–200)
Basophils Relative: 0.5 %
Eosinophils Absolute: 117 cells/uL (ref 15–500)
Eosinophils Relative: 1.5 %
HCT: 41.6 % (ref 35.0–45.0)
Hemoglobin: 13.3 g/dL (ref 11.7–15.5)
Lymphs Abs: 2714 cells/uL (ref 850–3900)
MCH: 26.4 pg — ABNORMAL LOW (ref 27.0–33.0)
MCHC: 32 g/dL (ref 32.0–36.0)
MCV: 82.5 fL (ref 80.0–100.0)
MPV: 10.2 fL (ref 7.5–12.5)
Monocytes Relative: 6.8 %
Neutro Abs: 4399 cells/uL (ref 1500–7800)
Neutrophils Relative %: 56.4 %
Platelets: 276 10*3/uL (ref 140–400)
RBC: 5.04 10*6/uL (ref 3.80–5.10)
RDW: 12.4 % (ref 11.0–15.0)
Total Lymphocyte: 34.8 %
WBC: 7.8 10*3/uL (ref 3.8–10.8)

## 2018-05-01 LAB — COMPLETE METABOLIC PANEL WITH GFR
AG Ratio: 2.4 (calc) (ref 1.0–2.5)
ALT: 6 U/L (ref 6–29)
AST: 13 U/L (ref 10–35)
Albumin: 4.7 g/dL (ref 3.6–5.1)
Alkaline phosphatase (APISO): 78 U/L (ref 37–153)
BUN: 10 mg/dL (ref 7–25)
CO2: 25 mmol/L (ref 20–32)
Calcium: 9.7 mg/dL (ref 8.6–10.4)
Chloride: 105 mmol/L (ref 98–110)
Creat: 0.58 mg/dL (ref 0.50–1.05)
GFR, Est African American: 118 mL/min/{1.73_m2} (ref >=60)
GFR, Est Non African American: 102 mL/min/{1.73_m2} (ref >=60)
Globulin: 2 g/dL (calc) (ref 1.9–3.7)
Glucose, Bld: 96 mg/dL (ref 65–99)
Potassium: 4.4 mmol/L (ref 3.5–5.3)
Sodium: 138 mmol/L (ref 135–146)
Total Bilirubin: 0.4 mg/dL (ref 0.2–1.2)
Total Protein: 6.7 g/dL (ref 6.1–8.1)

## 2018-05-01 LAB — IRON, TOTAL/TOTAL IRON BINDING CAP
%SAT: 12 % (calc) — ABNORMAL LOW (ref 16–45)
Iron: 43 ug/dL — ABNORMAL LOW (ref 45–160)
TIBC: 347 mcg/dL (calc) (ref 250–450)

## 2018-05-01 LAB — LIPID PANEL
Cholesterol: 202 mg/dL — ABNORMAL HIGH (ref <200)
HDL: 69 mg/dL (ref >=50)
LDL Cholesterol (Calc): 115 mg/dL (calc) — ABNORMAL HIGH
Non-HDL Cholesterol (Calc): 133 mg/dL (calc) — ABNORMAL HIGH (ref <130)
Total CHOL/HDL Ratio: 2.9 (calc) (ref <5.0)
Triglycerides: 84 mg/dL (ref <150)

## 2018-05-01 LAB — HEMOGLOBIN A1C
Hgb A1c MFr Bld: 5.6 % of total Hgb (ref <5.7)
Mean Plasma Glucose: 114 (calc)
eAG (mmol/L): 6.3 (calc)

## 2018-05-01 LAB — FERRITIN: Ferritin: 102 ng/mL (ref 16–232)

## 2018-05-01 LAB — VITAMIN D 25 HYDROXY (VIT D DEFICIENCY, FRACTURES): Vit D, 25-Hydroxy: 29 ng/mL — ABNORMAL LOW (ref 30–100)

## 2018-05-01 LAB — VITAMIN B12: Vitamin B-12: 313 pg/mL (ref 200–1100)

## 2018-05-01 LAB — TSH: TSH: 1.99 mIU/L (ref 0.40–4.50)

## 2018-05-01 LAB — MAGNESIUM: Magnesium: 2.1 mg/dL (ref 1.5–2.5)

## 2018-05-02 ENCOUNTER — Ambulatory Visit: Payer: Self-pay | Admitting: Physician Assistant

## 2018-05-21 NOTE — Progress Notes (Signed)
58 y.o.female presents for a follow up after being on phentermine, taking daily, for weight loss.  While on the medication they have lost 6 lbs since last visit. They deny palpitations, anxiety, trouble sleeping, elevated BP.  She started B12 sublingual.  She is on an iron supplement, every other day due to stomach pain.  She continues to wake up fatigued, she is not interested in a sleep study at this time. She is taking care of her parents, sister.   BP Readings from Last 3 Encounters:  04/30/18 126/80  10/29/17 124/68  10/24/17 118/74   BMI is Body mass index is 32.11 kg/m., she is working on diet and exercise. Wt Readings from Last 3 Encounters:  05/26/18 190 lb (86.2 kg)  04/30/18 196 lb 6.4 oz (89.1 kg)  10/29/17 188 lb (85.3 kg)    Medications: Current Outpatient Medications on File Prior to Visit  Medication Sig Dispense Refill  . acyclovir (ZOVIRAX) 800 MG tablet Take 1 tablet (800 mg total) by mouth daily as needed. 90 tablet 1  . clobetasol ointment (TEMOVATE) 0.35 % Apply 1 application topically 2 (two) times daily as needed.    Marland Kitchen HYDROcodone-Acetaminophen (NORCO PO) Take by mouth as needed.    . meclizine (ANTIVERT) 25 MG tablet Take 25 mg by mouth 3 (three) times daily as needed for dizziness.    . phentermine (ADIPEX-P) 37.5 MG tablet Take 1 tablet (37.5 mg total) by mouth daily before breakfast. 30 tablet 2   No current facility-administered medications on file prior to visit.     ROS: All negative except for above  Physical exam: There were no vitals filed for this visit. Physical Exam General Appearance:Well sounding, in no apparent distress.  ENT/Mouth: No hoarseness, No cough for duration of visit.  Respiratory: completing full sentences without distress, without audible wheeze Neuro: Awake and oriented X 3,  Psych:  Insight and Judgment appropriate.   Assessment: Obesity with co morbid conditions.   Plan: General weight loss/lifestyle modification  strategies discussed (elicit support from others; identify saboteurs; non-food rewards, etc). Continue food diary Continue restricted calorie diet Continue daily exercise as well as behavior modification such as walking further away, putting down the fork, having a plan, using stairs, etc.  Medication: phentermine  Suggest sleep study if continues to have morning fatigue.  Follow up in 2 months  Future Appointments  Date Time Provider [place]  07/31/2018  8:45 AM Vicie Mutters, PA-C [facility] None  11/03/2018  9:00 AM Vicie Mutters, PA-C [facility] None

## 2018-05-26 ENCOUNTER — Ambulatory Visit: Payer: Managed Care, Other (non HMO) | Admitting: Physician Assistant

## 2018-05-26 ENCOUNTER — Encounter: Payer: Self-pay | Admitting: Physician Assistant

## 2018-05-26 ENCOUNTER — Other Ambulatory Visit: Payer: Self-pay

## 2018-05-26 VITALS — Ht 64.5 in | Wt 190.0 lb

## 2018-05-26 DIAGNOSIS — E538 Deficiency of other specified B group vitamins: Secondary | ICD-10-CM

## 2018-05-26 DIAGNOSIS — Z6831 Body mass index (BMI) 31.0-31.9, adult: Secondary | ICD-10-CM

## 2018-05-26 NOTE — Patient Instructions (Addendum)
WATER IS IMPORTANT  Being dehydrated can hurt your kidneys, cause fatigue, headaches, muscle aches, joint pain, and dry skin/nails so please increase your fluids.   Drink 80-100 oz a day of water, measure it out! Eat 3 meals a day, have to do breakfast, eat protein- hard boiled eggs, protein bar like nature [place] protein bar, greek yogurt like oikos triple zero, chobani 100, or light n fit greek  Can check out plantnanny app on your phone to help you keep track of your water      When it comes to diets, agreement about the perfect plan isn't easy to find, even among the experts. Experts at the [employer] developed an idea known as the Healthy Eating Plate. Just imagine a plate divided into logical, healthy portions.  The emphasis is on diet quality:  Load up on vegetables and fruits - one-half of your plate: Aim for color and variety, and remember that potatoes don't count.  Go for whole grains - one-quarter of your plate: Whole wheat, barley, wheat berries, quinoa, oats, brown rice, and foods made with them. If you want pasta, go with whole wheat pasta.  Protein power - one-quarter of your plate: Fish, chicken, beans, and nuts are all healthy, versatile protein sources. Limit red meat.  The diet, however, does go beyond the plate, offering a few other suggestions.  Use healthy plant oils, such as olive, canola, soy, corn, sunflower and peanut. Check the labels, and avoid partially hydrogenated oil, which have unhealthy trans fats.  If you're thirsty, drink water. Coffee and tea are good in moderation, but skip sugary drinks and limit milk and dairy products to one or two daily servings.  The type of carbohydrate in the diet is more important than the amount. Some sources of carbohydrates, such as vegetables, fruits, whole grains, and beans-are healthier than others.  Finally, stay active.    Sleep Studies A sleep study (polysomnogram) is a series of tests  done while you are sleeping. A sleep study records your brain waves, heart rate, breathing rate, oxygen level, and eye and leg movements. A sleep study helps your health care provider:  See how well you sleep.  Diagnose a sleep disorder.  Determine how severe your sleep disorder is.  Create a plan to treat your sleep disorder. Your health care provider may recommend a sleep study if you:  Feel sleepy on most days.  Snore loudly while sleeping.  Have unusual behaviors while you sleep, such as walking.  Have brief periods in which you stop breathing during sleep (sleepapnea).  Fall asleep suddenly during the day (narcolepsy).  Have trouble falling asleep or staying asleep (insomnia).  Feel like you need to move your legs when trying to fall asleep (restless legs syndrome).  Move your legs by flexing and extending them regularly while asleep (periodic limb movement disorder).  Act out your dreams while you sleep (sleep behavior disorder).  Feel like you cannot move when you first wake up (sleep paralysis). What tests are part of a sleep study? Most sleep studies record the following during sleep:  Brain activity.  Eye movements.  Heart rate and rhythm.  Breathing rate and rhythm.  Blood-oxygen level.  Blood pressure.  Chest and belly movement as you breathe.  Arm and leg movements.  Snoring or other noises.  Body position. Where are sleep studies done? Sleep studies are done at sleep centers. A sleep center may be inside a hospital, office, or clinic. The  room where you have the study may look like a hospital room or a hotel room. The health care providers doing the study may come in and out of the room during the study. Most of the time, they will be in another room monitoring your test as you sleep. How are sleep studies done? Most sleep studies are done during a normal period of time for a full night of sleep. You will arrive at the study center in the evening  and go home in the morning. Before the test  Bring your pajamas and toothbrush with you to the sleep study.  Do not have caffeine on the day of your sleep study.  Do not drink alcohol on the day of your sleep study.  Your health care provider will let you know if you should stop taking any of your regular medicines before the test. During the test      Round, sticky patches with sensors attached to recording wires (electrodes) are placed on your scalp, face, chest, and limbs.  Wires from all the electrodes and sensors run from your bed to a computer. The wires can be taken off and put back on if you need to get out of bed to go to the bathroom.  A sensor is placed over your nose to measure airflow.  A finger clip is put on your finger or ear to measure your blood oxygen level (pulse oximetry).  A belt is placed around your belly and a belt is placed around your chest to measure breathing movements.  If you have signs of the sleep disorder called sleep apnea during your test, you may get a treatment mask to wear for the second half of the night. ? The mask provides positive airway pressure (PAP) to help you breathe better during sleep. This may greatly improve your sleep apnea. ? You will then have all tests done again with the mask in place to see if your measurements and recordings change. After the test  A medical doctor who specializes in sleep will evaluate the results of your sleep study and share them with you and your primary health care provider.  Based on your results, your medical history, and a physical exam, you may be diagnosed with a sleep disorder, such as: ? Sleep apnea. ? Restless legs syndrome. ? Sleep-related behavior disorder. ? Sleep-related movement disorders. ? Sleep-related seizure disorders.  Your health care team will help determine your treatment options based on your diagnosis. This may include: ? Improving your sleep habits (sleep hygiene). ?  Wearing a continuous positive airway pressure (CPAP) or bi-level positive airway pressure (BPAP) mask. ? Wearing an oral device at night to improve breathing and reduce snoring. ? Taking medicines. Follow these instructions at home:  Take over-the-counter and prescription medicines only as told by your health care provider.  If you are instructed to use a CPAP or BPAP mask, make sure you use it nightly as directed.  Make any lifestyle changes that your health care provider recommends.  If you were given a device to open your airway while you sleep, use it only as told by your health care provider.  Do not use any tobacco products, such as cigarettes, chewing tobacco, and e-cigarettes. If you need help quitting, ask your health care provider.  Keep all follow-up visits as told by your health care provider. This is important. Summary  A sleep study (polysomnogram) is a series of tests done while you are sleeping. It shows how well  you sleep.  Most sleep studies are done over one full night of sleep. You will arrive at the study center in the evening and go home in the morning.  If you have signs of the sleep disorder called sleep apnea during your test, you may get a treatment mask to wear for the second half of the night.  A medical doctor who specializes in sleep will evaluate the results of your sleep study and share them with your primary health care provider. This information is not intended to replace advice given to you by your health care provider. Make sure you discuss any questions you have with your health care provider. Document Released: 07/08/2002 Document Revised: 01/29/2017 Document Reviewed: 01/29/2017 Elsevier Interactive Patient Education  [employer].

## 2018-07-30 NOTE — Progress Notes (Signed)
Assessment and Plan:   Elevated blood pressure reading without diagnosis of hypertension - continue medications, DASH diet, exercise and monitor at home. Call if greater than 130/80.  -     CBC with Differential/Platelet -     COMPLETE METABOLIC PANEL WITH GFR -     TSH  Hyperlipidemia, unspecified hyperlipidemia type check lipids decrease fatty foods increase activity.  -     Lipid panel  obesity -     phentermine (ADIPEX-P) 37.5 MG tablet; Take 1 tablet (37.5 mg total) by mouth daily before breakfast. - follow up 3 months for progress monitoring - increase veggies, decrease carbs - long discussion about weight loss, diet, and exercise  B12 deficiency -     Ferritin -     Vitamin B12 -     Iron,Total/Total Iron Binding Cap  Vitamin D deficiency -     VITAMIN D 25 Hydroxy (Vit-D Deficiency, Fractures)  Abnormal glucose -     Hemoglobin A1c Discussed disease progression and risks Discussed diet/exercise, weight management and risk modification  Medication management -     Magnesium  Anxiety - start new medication prescribed, Buspar, stress management techniques discussed, increase water, good sleep hygiene discussed, increase exercise, and increase veggies. Follow up 3 months, call the office if any new AE's from medications and we will switch them.    Continue diet and meds as discussed. Further disposition pending results of labs. Over 30 minutes of exam, counseling, chart review, and critical decision making was performed  Future Appointments  Date Time Provider [place]  11/03/2018  9:00 AM Vicie Mutters, PA-C [id number] None     HPI 58 y.o. female  presents for 6 month follow up on hypertension, cholesterol, prediabetes, and vitamin D deficiency.   She had cataract surgery and needs to have another, she did a synvisc shot in her right knee but states she will have surgery eventually. Will wait until after her mother's hip surgery. Son that is due  with baby in Oct had back surgery, she has been helping over there.   BMI is Body mass index is 30.86 kg/m., she is working on diet and exercise. She only drinks water.  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  She states she has severe fatigue.  Has never had sleep study. She has failed naltrexone, topamax, she is on phentermine and doing well.  Wt Readings from Last 3 Encounters:  07/31/18 182 lb 9.6 oz (82.8 kg)  05/26/18 190 lb (86.2 kg)  04/30/18 196 lb 6.4 oz (89.1 kg)   Her blood pressure has been controlled at home, today their BP is BP: 124/80   She does workout, has been walking her dog that has muscular issue and still working at grocery store on the weekend. She denies chest pain, shortness of breath, dizziness.  She is not on cholesterol medication and denies myalgias. Her cholesterol is at goal. The cholesterol last visit was:   Lab Results  Component Value Date   CHOL 202 (H) 04/30/2018   HDL 69 04/30/2018   LDLCALC 115 (H) 04/30/2018   TRIG 84 04/30/2018   CHOLHDL 2.9 04/30/2018   Last A1C in the office was:  Lab Results  Component Value Date   HGBA1C 5.6 04/30/2018   Patient is on Vitamin D supplement, she is on 5000 IU a day. She is on B12 sublingual.   Lab Results  Component Value  Date   VD25OH 29 (L) 04/30/2018   Lab Results  Component Value Date   VITAMINB12 313 04/30/2018   Lab Results  Component Value Date   IRON 43 (L) 04/30/2018   TIBC 347 04/30/2018   FERRITIN 102 04/30/2018     Current Medications:  Current Outpatient Medications on File Prior to Visit  Medication Sig Dispense Refill  . acyclovir (ZOVIRAX) 800 MG tablet Take 1 tablet (800 mg total) by mouth daily as needed. 90 tablet 1  . clobetasol ointment (TEMOVATE) 4.26 % Apply 1 application topically 2 (two) times daily as needed.    Marland Kitchen HYDROcodone-Acetaminophen (NORCO PO) Take by mouth as needed.    . meclizine (ANTIVERT) 25 MG tablet Take 25 mg  by mouth 3 (three) times daily as needed for dizziness.    . phentermine (ADIPEX-P) 37.5 MG tablet Take 1 tablet (37.5 mg total) by mouth daily before breakfast. 30 tablet 2   No current facility-administered medications on file prior to visit.    Medical History:  Past Medical History:  Diagnosis Date  . Anal fissure   . DDD (degenerative disc disease), cervical   . DDD (degenerative disc disease), lumbar   . Hyperlipidemia   . Hypertension    Patient denies.  . IBS (irritable bowel syndrome)   . Lower back pain   . Neck pain   . Plantar fasciitis of left foot   . Plantar fasciitis of right foot   . Unspecified vitamin D deficiency    Allergies:  Allergies  Allergen Reactions  . Aspirin Other (See Comments)    GI upset  . Mobic [Meloxicam] Other (See Comments)    GI upset  . Wellbutrin [Bupropion] Other (See Comments)    headache  . Z-Pak [Azithromycin] Other (See Comments)    thrush  . Penicillins Rash     Review of Systems:  Review of Systems  Constitutional: Positive for malaise/fatigue. Negative for chills, diaphoresis, fever and weight loss.  HENT: Negative.   Eyes: Negative.   Respiratory: Negative.   Cardiovascular: Negative.   Gastrointestinal: Negative.   Genitourinary: Negative.   Musculoskeletal: Negative.   Skin: Negative.   Neurological: Negative.  Negative for weakness.  Endo/Heme/Allergies: Negative.   Psychiatric/Behavioral: Negative.     Family history- Review and unchanged Social history- Review and unchanged Physical Exam: BP 124/80   Pulse 73   Temp (!) 97.3 F (36.3 C)   Ht 5' 4.5" (1.638 m)   Wt 182 lb 9.6 oz (82.8 kg)   LMP 05/15/2010   SpO2 97%   BMI 30.86 kg/m  Wt Readings from Last 3 Encounters:  07/31/18 182 lb 9.6 oz (82.8 kg)  05/26/18 190 lb (86.2 kg)  04/30/18 196 lb 6.4 oz (89.1 kg)   General Appearance: Well nourished, in no apparent distress. Eyes: PERRLA, EOMs, conjunctiva no swelling or erythema Sinuses: No  Frontal/maxillary tenderness ENT/Mouth: Ext aud canals clear, TMs without erythema, bulging. No erythema, swelling, or exudate on post pharynx.  Tonsils not swollen or erythematous. Hearing normal.  Neck: Supple, thyroid normal.  Respiratory: Respiratory effort normal, BS equal bilaterally without rales, rhonchi, wheezing or stridor.  Cardio: RRR with no MRGs. Brisk peripheral pulses without edema.  Abdomen: Soft, + BS,  Non tender, no guarding, rebound, hernias, masses. Lymphatics: Non tender without lymphadenopathy.  Musculoskeletal: Full ROM, 5/5 strength, Normal gait Skin: Warm, dry without rashes, lesions, ecchymosis.  Neuro: Cranial nerves intact. Normal muscle tone, no cerebellar symptoms. Psych: Awake and oriented X 3,  normal affect, Insight and Judgment appropriate.    Vicie Mutters, PA-C 8:56 AM [facility]

## 2018-07-31 ENCOUNTER — Ambulatory Visit: Payer: Managed Care, Other (non HMO) | Admitting: Physician Assistant

## 2018-07-31 ENCOUNTER — Encounter: Payer: Self-pay | Admitting: Physician Assistant

## 2018-07-31 ENCOUNTER — Other Ambulatory Visit: Payer: Self-pay

## 2018-07-31 VITALS — BP 124/80 | HR 73 | Temp 97.3°F | Ht 64.5 in | Wt 182.6 lb

## 2018-07-31 DIAGNOSIS — E538 Deficiency of other specified B group vitamins: Secondary | ICD-10-CM | POA: Diagnosis not present

## 2018-07-31 DIAGNOSIS — E559 Vitamin D deficiency, unspecified: Secondary | ICD-10-CM | POA: Diagnosis not present

## 2018-07-31 DIAGNOSIS — Z79899 Other long term (current) drug therapy: Secondary | ICD-10-CM

## 2018-07-31 DIAGNOSIS — R7309 Other abnormal glucose: Secondary | ICD-10-CM

## 2018-07-31 DIAGNOSIS — Z6831 Body mass index (BMI) 31.0-31.9, adult: Secondary | ICD-10-CM

## 2018-07-31 DIAGNOSIS — Z87891 Personal history of nicotine dependence: Secondary | ICD-10-CM

## 2018-07-31 DIAGNOSIS — E785 Hyperlipidemia, unspecified: Secondary | ICD-10-CM

## 2018-07-31 DIAGNOSIS — R030 Elevated blood-pressure reading, without diagnosis of hypertension: Secondary | ICD-10-CM | POA: Diagnosis not present

## 2018-07-31 MED ORDER — BUSPIRONE HCL 5 MG PO TABS: 5.0000 mg | ORAL_TABLET | Freq: Two times a day (BID) | ORAL | 0 refills | Status: DC | PRN

## 2018-07-31 MED ORDER — PHENTERMINE HCL 37.5 MG PO TABS: 37.5000 mg | ORAL_TABLET | Freq: Every day | ORAL | 2 refills | Status: DC

## 2018-07-31 NOTE — Patient Instructions (Signed)
      When it comes to diets, agreement about the perfect plan isn't easy to find, even among the experts. Experts at the [employer] developed an idea known as the Healthy Eating Plate. Just imagine a plate divided into logical, healthy portions.  The emphasis is on diet quality:  Load up on vegetables and fruits - one-half of your plate: Aim for color and variety, and remember that potatoes don't count.  Go for whole grains - one-quarter of your plate: Whole wheat, barley, wheat berries, quinoa, oats, brown rice, and foods made with them. If you want pasta, go with whole wheat pasta.  Protein power - one-quarter of your plate: Fish, chicken, beans, and nuts are all healthy, versatile protein sources. Limit red meat.  The diet, however, does go beyond the plate, offering a few other suggestions.  Use healthy plant oils, such as olive, canola, soy, corn, sunflower and peanut. Check the labels, and avoid partially hydrogenated oil, which have unhealthy trans fats.  If you're thirsty, drink water. Coffee and tea are good in moderation, but skip sugary drinks and limit milk and dairy products to one or two daily servings.  The type of carbohydrate in the diet is more important than the amount. Some sources of carbohydrates, such as vegetables, fruits, whole grains, and beans-are healthier than others.  Finally, stay active.  I want to challenge you to lose 10 lbs before the next appointment.  That would be about 3 lbs a month!  This is very achievable and I know you can do it.  10 lbs is actually 40 lbs of pressure of your joints, back, hips, knees and ankles.  10lbs may be enough to help your blood pressure and cholesterol for next visit.

## 2018-08-01 LAB — COMPLETE METABOLIC PANEL WITH GFR
AG Ratio: 2 (calc) (ref 1.0–2.5)
ALT: 6 U/L (ref 6–29)
AST: 15 U/L (ref 10–35)
Albumin: 4.5 g/dL (ref 3.6–5.1)
Alkaline phosphatase (APISO): 79 U/L (ref 37–153)
BUN: 10 mg/dL (ref 7–25)
CO2: 24 mmol/L (ref 20–32)
Calcium: 10 mg/dL (ref 8.6–10.4)
Chloride: 103 mmol/L (ref 98–110)
Creat: 0.59 mg/dL (ref 0.50–1.05)
GFR, Est African American: 117 mL/min/{1.73_m2} (ref >=60)
GFR, Est Non African American: 101 mL/min/{1.73_m2} (ref >=60)
Globulin: 2.3 g/dL (calc) (ref 1.9–3.7)
Glucose, Bld: 92 mg/dL (ref 65–99)
Potassium: 4.7 mmol/L (ref 3.5–5.3)
Sodium: 138 mmol/L (ref 135–146)
Total Bilirubin: 0.6 mg/dL (ref 0.2–1.2)
Total Protein: 6.8 g/dL (ref 6.1–8.1)

## 2018-08-01 LAB — LIPID PANEL
Cholesterol: 204 mg/dL — ABNORMAL HIGH (ref <200)
HDL: 60 mg/dL (ref >=50)
LDL Cholesterol (Calc): 125 mg/dL (calc) — ABNORMAL HIGH
Non-HDL Cholesterol (Calc): 144 mg/dL (calc) — ABNORMAL HIGH (ref <130)
Total CHOL/HDL Ratio: 3.4 (calc) (ref <5.0)
Triglycerides: 91 mg/dL (ref <150)

## 2018-08-01 LAB — HEMOGLOBIN A1C
Hgb A1c MFr Bld: 5.5 % of total Hgb (ref <5.7)
Mean Plasma Glucose: 111 (calc)
eAG (mmol/L): 6.2 (calc)

## 2018-08-01 LAB — CBC WITH DIFFERENTIAL/PLATELET
Absolute Monocytes: 433 cells/uL (ref 200–950)
Basophils Absolute: 43 cells/uL (ref 0–200)
Basophils Relative: 0.6 %
Eosinophils Absolute: 92 cells/uL (ref 15–500)
Eosinophils Relative: 1.3 %
HCT: 42 % (ref 35.0–45.0)
Hemoglobin: 13.8 g/dL (ref 11.7–15.5)
Lymphs Abs: 2989 cells/uL (ref 850–3900)
MCH: 26.6 pg — ABNORMAL LOW (ref 27.0–33.0)
MCHC: 32.9 g/dL (ref 32.0–36.0)
MCV: 81.1 fL (ref 80.0–100.0)
MPV: 10.4 fL (ref 7.5–12.5)
Monocytes Relative: 6.1 %
Neutro Abs: 3543 cells/uL (ref 1500–7800)
Neutrophils Relative %: 49.9 %
Platelets: 295 10*3/uL (ref 140–400)
RBC: 5.18 10*6/uL — ABNORMAL HIGH (ref 3.80–5.10)
RDW: 12.2 % (ref 11.0–15.0)
Total Lymphocyte: 42.1 %
WBC: 7.1 10*3/uL (ref 3.8–10.8)

## 2018-08-01 LAB — IRON, TOTAL/TOTAL IRON BINDING CAP
%SAT: 22 % (calc) (ref 16–45)
Iron: 69 ug/dL (ref 45–160)
TIBC: 319 mcg/dL (calc) (ref 250–450)

## 2018-08-01 LAB — FERRITIN: Ferritin: 122 ng/mL (ref 16–232)

## 2018-08-01 LAB — MAGNESIUM: Magnesium: 2.2 mg/dL (ref 1.5–2.5)

## 2018-08-01 LAB — VITAMIN B12: Vitamin B-12: 596 pg/mL (ref 200–1100)

## 2018-08-01 LAB — TSH: TSH: 1.5 mIU/L (ref 0.40–4.50)

## 2018-08-01 LAB — VITAMIN D 25 HYDROXY (VIT D DEFICIENCY, FRACTURES): Vit D, 25-Hydroxy: 41 ng/mL (ref 30–100)

## 2018-09-06 ENCOUNTER — Other Ambulatory Visit: Payer: Self-pay | Admitting: Physician Assistant

## 2018-10-30 NOTE — Progress Notes (Signed)
Complete Physical  Assessment and Plan:  Encounter for general adult medical examination with abnormal findings 1 year  Elevated blood pressure reading without diagnosis of hypertension - continue medications, DASH diet, exercise and monitor at home. Call if greater than 130/80.  -     CBC with Differential/Platelet -     COMPLETE METABOLIC PANEL WITH GFR -     TSH -     Urinalysis, Routine w reflex microscopic -     Microalbumin / creatinine urine ratio -     EKG 12-Lead  Hyperlipidemia, unspecified hyperlipidemia type check lipids decrease fatty foods increase activity.  -     Lipid panel -     EKG 12-Lead  B12 deficiency -     Iron,Total/Total Iron Binding Cap -     Vitamin B12  Vitamin D deficiency -     VITAMIN D 25 Hydroxy (Vit-D Deficiency, Fractures)  HLA B27 (HLA B27 positive) Monitor  Insomnia, unspecified type -     Ambulatory referral to Sleep Studies - patient has frequent awakenings, day time fatigue, suggest sleep referral to rule out organic causes.   History of IBS Diet controlled  History of tobacco abuse  commended patient for quitting and reviewed strategies for preventing relapses  DDD (degenerative disc disease), cervical Monitor  BMI 31.0-31.9,adult -     phentermine (ADIPEX-P) 37.5 MG tablet; Take 1 tablet (37.5 mg total) by mouth daily before breakfast. - follow up 3 months for progress monitoring - increase veggies, decrease carbs - long discussion about weight loss, diet, and exercise  Medication management -     Magnesium  Abnormal glucose -     Hemoglobin A1c -Discussed disease progression and risks Discussed diet/exercise, weight management and risk modification   Discussed med's effects and SE's. Screening labs and tests as requested with regular follow-up as recommended. Over 40 minutes of exam, counseling, chart review, and complex, high level critical decision making was performed this visit.  Future Appointments  Date  Time Provider [place]  11/04/2019  9:00 AM Vicie Mutters, PA-C [facility] None    HPI  58 y.o. female  presents for a complete physical and follow up for has Family history of ischemic heart disease; History of tobacco abuse; Vitamin D deficiency; Elevated blood pressure reading without diagnosis of hypertension; Hyperlipidemia; DDD (degenerative disc disease), cervical; DDD (degenerative disc disease), lumbar; HLA B27 (HLA B27 positive); History of IBS; BMI 31.0-31.9,adult; Insomnia; and B12 deficiency on their problem list..  Her blood pressure has been controlled at home, today their BP is BP: 128/86 She does not workout. She denies chest pain, shortness of breath, dizziness.   BMI is Body mass index is 31.55 kg/m., she is working on diet and exercise. She only drinks water.   She states she has severe fatigue, possible RLS, did not tolerate trazodone, never had sleep study works 7 days a week so states she does not have time but she is taking a week off in Nov.  Has never had sleep study. She has failed naltrexone, topamax, wellbutrin, she is on phentermine but not taking it regularly.  Wt Readings from Last 3 Encounters:  11/03/18 183 lb 12.8 oz (83.4 kg)  07/31/18 182 lb 9.6 oz (82.8 kg)  05/26/18 190 lb (86.2 kg)   She is not on cholesterol medication and denies myalgias. Her cholesterol is at goal. The cholesterol last visit was:   Lab Results  Component Value Date   CHOL 204 (H) 07/31/2018  HDL 60 07/31/2018   LDLCALC 125 (H) 07/31/2018   TRIG 91 07/31/2018   CHOLHDL 3.4 07/31/2018    Last A1C in the office was:  Lab Results  Component Value Date   HGBA1C 5.5 07/31/2018   Last GFR: Lab Results  Component Value Date   GFRNONAA 101 07/31/2018   Patient is on Vitamin D supplement.   Lab Results  Component Value Date   VD25OH 41 07/31/2018      Current Medications:  Current Outpatient Medications on File Prior to Visit  Medication Sig Dispense Refill  .  acyclovir (ZOVIRAX) 800 MG tablet Take 1 tablet (800 mg total) by mouth daily as needed. 90 tablet 1  . busPIRone (BUSPAR) 5 MG tablet Take 1 tablet 2 x /day as needed for Anxiety 180 tablet 0  . clobetasol ointment (TEMOVATE) 2.35 % Apply 1 application topically 2 (two) times daily as needed.    Marland Kitchen HYDROcodone-Acetaminophen (NORCO PO) Take by mouth as needed.    . meclizine (ANTIVERT) 25 MG tablet Take 25 mg by mouth 3 (three) times daily as needed for dizziness.    Marland Kitchen PREDNISOLONE-MOXIFLOXACIN OP Apply to eye. patient reports( 07/31/18 ) med name as PREDNISONE MOXIFLOXACIN NEPAFENAC STERILE OP SUSPENSION     No current facility-administered medications on file prior to visit.    Allergies:  Allergies  Allergen Reactions  . Aspirin Other (See Comments)    GI upset  . Mobic [Meloxicam] Other (See Comments)    GI upset  . Wellbutrin [Bupropion] Other (See Comments)    headache  . Z-Pak [Azithromycin] Other (See Comments)    thrush  . Penicillins Rash   Medical History:  She has Family history of ischemic heart disease; History of tobacco abuse; Vitamin D deficiency; Elevated blood pressure reading without diagnosis of hypertension; Hyperlipidemia; DDD (degenerative disc disease), cervical; DDD (degenerative disc disease), lumbar; HLA B27 (HLA B27 positive); History of IBS; BMI 31.0-31.9,adult; Insomnia; and B12 deficiency on their problem list.   Health Maintenance:   Immunization History  Administered Date(s) Administered  . Hepatitis B 01/16/2000  . PPD Test 02/18/2013  . Pneumococcal Polysaccharide-23 01/15/1993, 02/18/2013  . Tdap 09/16/2006, 10/24/2017   Tetanus: 2019 Pneumovax: 2015 Prevnar 13: age 51 Flu vaccine: declines Zostavax: declines  Pap: [facility] MGM: 2019 gets at [place]: defer age 66 Colonoscopy: 11/2016 EGD: Ct spine 2015  Patient Care Team: Unk Pinto, MD as PCP - General (Internal Medicine)  Surgical History:  She has a past  surgical history that includes lower back; Neck surgery; Plantar fascia surgery; Cesarean section; and Eye surgery (Bilateral, 2020). Family History:  Herfamily history includes Cancer in her maternal aunt; Cervical cancer in her sister; Diabetes in her father; Heart disease in her father, maternal grandfather, and maternal grandmother; Hyperlipidemia in her father and mother; Hypertension in her father and mother; Stroke in her maternal aunt. Social History:  She reports that she quit smoking about 4 years ago. Her smoking use included cigarettes. She quit after 22.00 years of use. She has never used smokeless tobacco. She reports that she does not drink alcohol or use drugs.  Review of Systems: Review of Systems  Constitutional: Positive for malaise/fatigue. Negative for chills, diaphoresis, fever and weight loss.  HENT: Negative.   Eyes: Negative.   Respiratory: Negative.   Cardiovascular: Negative.   Gastrointestinal: Negative.   Genitourinary: Negative.   Musculoskeletal: Negative.   Skin: Negative.   Neurological: Negative.  Negative for weakness.  Endo/Heme/Allergies: Negative.  Psychiatric/Behavioral: The patient has insomnia.     Physical Exam: Estimated body mass index is 31.55 kg/m as calculated from the following:   Height as of this encounter: _0  (1.626 m).   Weight as of this encounter: 183 lb 12.8 oz (83.4 kg). BP 128/86   Pulse 64   Temp (!) 97.5 F (36.4 C)   Ht _1  (1.626 m)   Wt 183 lb 12.8 oz (83.4 kg)   LMP 05/15/2010   SpO2 98%   BMI 31.55 kg/m  General Appearance: Well nourished, in no apparent distress.  Eyes: PERRLA, EOMs, conjunctiva no swelling or erythema, normal fundi and vessels.  Sinuses: No Frontal/maxillary tenderness  ENT/Mouth: Ext aud canals clear, normal light reflex with TMs without erythema, bulging. Good dentition. No erythema, swelling, or exudate on post pharynx. Tonsils not swollen or erythematous. Hearing normal.  Neck:  Supple, thyroid normal. No bruits  Respiratory: Respiratory effort normal, BS equal bilaterally without rales, rhonchi, wheezing or stridor.  Cardio: RRR without murmurs, rubs or gallops. Brisk peripheral pulses without edema.  Chest: symmetric, with normal excursions and percussion.  Breasts: defer Abdomen: Soft, nontender, no guarding, rebound, hernias, masses, or organomegaly.  Lymphatics: Non tender without lymphadenopathy.  Genitourinary: defer Musculoskeletal: Full ROM all peripheral extremities,5/5 strength, and normal gait.  Skin: Warm, dry without rashes, lesions, ecchymosis. Neuro: Cranial nerves intact, reflexes equal bilaterally. Normal muscle tone, no cerebellar symptoms. Sensation intact.  Psych: Awake and oriented X 3, normal affect, Insight and Judgment appropriate.   EKG: WNL no ST changes. AORTA SCAN:defer  Vicie Mutters 10:28 AM [facility]

## 2018-11-03 ENCOUNTER — Encounter: Payer: Self-pay | Admitting: Physician Assistant

## 2018-11-03 ENCOUNTER — Ambulatory Visit: Payer: Managed Care, Other (non HMO) | Admitting: Physician Assistant

## 2018-11-03 ENCOUNTER — Other Ambulatory Visit: Payer: Self-pay

## 2018-11-03 VITALS — BP 128/86 | HR 64 | Temp 97.5°F | Ht 64.0 in | Wt 183.8 lb

## 2018-11-03 DIAGNOSIS — E538 Deficiency of other specified B group vitamins: Secondary | ICD-10-CM

## 2018-11-03 DIAGNOSIS — Z8719 Personal history of other diseases of the digestive system: Secondary | ICD-10-CM | POA: Diagnosis not present

## 2018-11-03 DIAGNOSIS — Z0000 Encounter for general adult medical examination without abnormal findings: Secondary | ICD-10-CM

## 2018-11-03 DIAGNOSIS — M5030 Other cervical disc degeneration, unspecified cervical region: Secondary | ICD-10-CM

## 2018-11-03 DIAGNOSIS — Z1389 Encounter for screening for other disorder: Secondary | ICD-10-CM | POA: Diagnosis not present

## 2018-11-03 DIAGNOSIS — Z79899 Other long term (current) drug therapy: Secondary | ICD-10-CM

## 2018-11-03 DIAGNOSIS — Z136 Encounter for screening for cardiovascular disorders: Secondary | ICD-10-CM | POA: Diagnosis not present

## 2018-11-03 DIAGNOSIS — R030 Elevated blood-pressure reading, without diagnosis of hypertension: Secondary | ICD-10-CM

## 2018-11-03 DIAGNOSIS — Z131 Encounter for screening for diabetes mellitus: Secondary | ICD-10-CM

## 2018-11-03 DIAGNOSIS — Z13220 Encounter for screening for lipoid disorders: Secondary | ICD-10-CM

## 2018-11-03 DIAGNOSIS — G4700 Insomnia, unspecified: Secondary | ICD-10-CM

## 2018-11-03 DIAGNOSIS — Z130 Encounter for screening for diseases of the blood and blood-forming organs and certain disorders involving the immune mechanism: Secondary | ICD-10-CM | POA: Diagnosis not present

## 2018-11-03 DIAGNOSIS — Z1589 Genetic susceptibility to other disease: Secondary | ICD-10-CM

## 2018-11-03 DIAGNOSIS — Z87891 Personal history of nicotine dependence: Secondary | ICD-10-CM

## 2018-11-03 DIAGNOSIS — E559 Vitamin D deficiency, unspecified: Secondary | ICD-10-CM | POA: Diagnosis not present

## 2018-11-03 DIAGNOSIS — E785 Hyperlipidemia, unspecified: Secondary | ICD-10-CM

## 2018-11-03 DIAGNOSIS — Z0001 Encounter for general adult medical examination with abnormal findings: Secondary | ICD-10-CM

## 2018-11-03 DIAGNOSIS — Z6831 Body mass index (BMI) 31.0-31.9, adult: Secondary | ICD-10-CM

## 2018-11-03 DIAGNOSIS — R7309 Other abnormal glucose: Secondary | ICD-10-CM

## 2018-11-03 MED ORDER — PHENTERMINE HCL 37.5 MG PO TABS: 37.5000 mg | ORAL_TABLET | Freq: Every day | ORAL | 2 refills | Status: DC

## 2018-11-03 NOTE — Patient Instructions (Signed)
VARICOSE VEINS Varicose veins are veins that have become enlarged and twisted. CAUSES This condition is the result of valves in the veins not working properly. Valves in the veins help return blood from the leg to the heart. When your calf muscles squeeze, the blood moves up your leg then the valves close and this continues until the blood gets back to your heart.  If these valves are damaged, blood flows backwards and backs up into the veins in the leg near the skin OR if your are sitting/standing for a long time without using your calf muscles the blood will back up into the veins in your legs. This causes the veins to become larger. People who are on their feet a lot, sit a lot without walking (like on a plane, at a desk, or in a car), who are pregnant, or who are overweight are more likely to develop varicose veins. SYMPTOMS   Bulging, twisted-appearing, bluish veins, most commonly found on the legs.  Leg pain or a feeling of heaviness. These symptoms may be worse at the end of the day.  Leg swelling.  Skin color changes. DIAGNOSIS  Varicose veins can usually be diagnosed with an exam of your legs by your caregiver. He or she may recommend an ultrasound of your leg veins. TREATMENT  Most varicose veins can be treated at home. However, other treatments are available for people who have persistent symptoms or who want to treat the cosmetic appearance of the varicose veins. But this is only cosmetic and they will return if not properly treated. These include:  Laser treatment of very small varicose veins.  Medicine that is shot (injected) into the vein. This medicine hardens the walls of the vein and closes off the vein. This treatment is called sclerotherapy. Afterwards, you may need to wear clothing or bandages that apply pressure.  Surgery. HOME CARE INSTRUCTIONS   Do not stand or sit in one position for long periods of time. Do not sit with your legs crossed. Rest with your legs raised  during the day.  Your legs have to be higher than your heart so that gravity will force the valves to open, so please really elevate your legs.   Wear elastic stockings or support hose. Do not wear other tight, encircling garments around the legs, pelvis, or waist.  [employer]  has a wide variety of well priced compression stockings. [place], [place] [address] [phone]  OR THERE ARE COPPER INFUSED COMPRESSION SOCKS AT [facility] OR [facility]  [employer] also has great cheap/afforable stockings or socks- the socks are easier to get on your feet  - can also get a jacob's donner that helps you put on the sock  Walk as much as possible to increase blood flow.  Raise the foot of your bed at night with 2-inch blocks.  If you get a cut in the skin over the vein and the vein bleeds, lie down with your leg raised and press on it with a clean cloth until the bleeding stops. Then place a bandage (dressing) on the cut. See your caregiver if it continues to bleed or needs stitches. SEEK MEDICAL CARE IF:   The skin around your ankle starts to break down.  You have pain, redness, tenderness, or hard swelling developing in your leg over a vein.  You are uncomfortable due to leg pain. Document Released: 10/11/2004 Document Revised: 03/26/2011 Document Reviewed: 02/27/2010 [facility] Patient Information 2014 [place].  General eating tips  What to Avoid . Avoid added sugars o Often added sugar can be found in processed foods such as many condiments, dry cereals, cakes, cookies, chips, crisps, crackers, candies, sweetened drinks, etc.  o Read labels and AVOID/DECREASE use of foods with the following in their ingredient list: Sugar, fructose, high fructose corn syrup, sucrose, glucose, maltose, dextrose, molasses, cane sugar, brown sugar, any type of syrup, agave nectar, etc.   . Avoid snacking in between meals- drink water or if you feel you need a snack, pick a high water  content snack such as cucumbers, watermelon, or any veggie.  Marland Kitchen Avoid foods made with flour o If you are going to eat food made with flour, choose those made with whole-grains; and, minimize your consumption as much as is tolerable . Avoid processed foods o These foods are generally stocked in the middle of the grocery store.  o Focus on shopping on the perimeter of the grocery.  What to Include . Vegetables o GREEN LEAFY VEGETABLES: Kale, spinach, mustard greens, collard greens, cabbage, broccoli, etc. o OTHER: Asparagus, cauliflower, eggplant, carrots, peas, Brussel sprouts, tomatoes, bell peppers, zucchini, beets, cucumbers, etc. . Grains, seeds, and legumes o Beans: kidney beans, black eyed peas, garbanzo beans, black beans, pinto beans, etc. o Whole, unrefined grains: brown rice, barley, bulgur, oatmeal, etc. . Healthy fats  o Avoid highly processed fats such as vegetable oil o Examples of healthy fats: avocado, olives, virgin olive oil, dark chocolate (?72% Cocoa), nuts (peanuts, almonds, walnuts, cashews, pecans, etc.) o Please still do small amount of these healthy fats, they are dense in calories.  . Low - Moderate Intake of Animal Sources of Protein o Meat sources: chicken, [place], salmon, tuna. Limit to 4 ounces of meat at one time or the size of your palm. o Consider limiting dairy sources, but when choosing dairy focus on: PLAIN [place] yogurt, cottage cheese, high-protein milk . Fruit o Choose berries

## 2018-11-04 LAB — COMPLETE METABOLIC PANEL WITH GFR
AG Ratio: 2.1 (calc) (ref 1.0–2.5)
ALT: 4 U/L — ABNORMAL LOW (ref 6–29)
AST: 12 U/L (ref 10–35)
Albumin: 4.4 g/dL (ref 3.6–5.1)
Alkaline phosphatase (APISO): 86 U/L (ref 37–153)
BUN: 11 mg/dL (ref 7–25)
CO2: 24 mmol/L (ref 20–32)
Calcium: 9.6 mg/dL (ref 8.6–10.4)
Chloride: 106 mmol/L (ref 98–110)
Creat: 0.57 mg/dL (ref 0.50–1.05)
GFR, Est African American: 118 mL/min/{1.73_m2} (ref >=60)
GFR, Est Non African American: 102 mL/min/{1.73_m2} (ref >=60)
Globulin: 2.1 g/dL (calc) (ref 1.9–3.7)
Glucose, Bld: 90 mg/dL (ref 65–99)
Potassium: 4.1 mmol/L (ref 3.5–5.3)
Sodium: 139 mmol/L (ref 135–146)
Total Bilirubin: 0.4 mg/dL (ref 0.2–1.2)
Total Protein: 6.5 g/dL (ref 6.1–8.1)

## 2018-11-04 LAB — CBC WITH DIFFERENTIAL/PLATELET
Absolute Monocytes: 470 cells/uL (ref 200–950)
Basophils Absolute: 50 cells/uL (ref 0–200)
Basophils Relative: 0.6 %
Eosinophils Absolute: 118 cells/uL (ref 15–500)
Eosinophils Relative: 1.4 %
HCT: 39.9 % (ref 35.0–45.0)
Hemoglobin: 13 g/dL (ref 11.7–15.5)
Lymphs Abs: 3007 cells/uL (ref 850–3900)
MCH: 26.5 pg — ABNORMAL LOW (ref 27.0–33.0)
MCHC: 32.6 g/dL (ref 32.0–36.0)
MCV: 81.4 fL (ref 80.0–100.0)
MPV: 10.5 fL (ref 7.5–12.5)
Monocytes Relative: 5.6 %
Neutro Abs: 4754 cells/uL (ref 1500–7800)
Neutrophils Relative %: 56.6 %
Platelets: 291 10*3/uL (ref 140–400)
RBC: 4.9 10*6/uL (ref 3.80–5.10)
RDW: 12.3 % (ref 11.0–15.0)
Total Lymphocyte: 35.8 %
WBC: 8.4 10*3/uL (ref 3.8–10.8)

## 2018-11-04 LAB — VITAMIN B12: Vitamin B-12: 651 pg/mL (ref 200–1100)

## 2018-11-04 LAB — URINALYSIS, ROUTINE W REFLEX MICROSCOPIC
Bilirubin Urine: NEGATIVE
Glucose, UA: NEGATIVE
Hgb urine dipstick: NEGATIVE
Ketones, ur: NEGATIVE
Leukocytes,Ua: NEGATIVE
Nitrite: NEGATIVE
Protein, ur: NEGATIVE
Specific Gravity, Urine: 1.015 (ref 1.001–1.03)
pH: 6.5 (ref 5.0–8.0)

## 2018-11-04 LAB — LIPID PANEL
Cholesterol: 174 mg/dL (ref <200)
HDL: 58 mg/dL (ref >=50)
LDL Cholesterol (Calc): 100 mg/dL (calc) — ABNORMAL HIGH
Non-HDL Cholesterol (Calc): 116 mg/dL (calc) (ref <130)
Total CHOL/HDL Ratio: 3 (calc) (ref <5.0)
Triglycerides: 71 mg/dL (ref <150)

## 2018-11-04 LAB — HEMOGLOBIN A1C
Hgb A1c MFr Bld: 5.3 % of total Hgb (ref <5.7)
Mean Plasma Glucose: 105 (calc)
eAG (mmol/L): 5.8 (calc)

## 2018-11-04 LAB — MICROALBUMIN / CREATININE URINE RATIO
Creatinine, Urine: 67 mg/dL (ref 20–275)
Microalb Creat Ratio: 4 mcg/mg creat (ref <30)
Microalb, Ur: 0.3 mg/dL

## 2018-11-04 LAB — MAGNESIUM: Magnesium: 2.1 mg/dL (ref 1.5–2.5)

## 2018-11-04 LAB — VITAMIN D 25 HYDROXY (VIT D DEFICIENCY, FRACTURES): Vit D, 25-Hydroxy: 43 ng/mL (ref 30–100)

## 2018-11-04 LAB — IRON, TOTAL/TOTAL IRON BINDING CAP
%SAT: 18 % (calc) (ref 16–45)
Iron: 57 ug/dL (ref 45–160)
TIBC: 320 mcg/dL (calc) (ref 250–450)

## 2018-11-04 LAB — TSH: TSH: 2.05 mIU/L (ref 0.40–4.50)

## 2018-11-12 ENCOUNTER — Other Ambulatory Visit: Payer: Self-pay

## 2018-11-12 ENCOUNTER — Encounter: Payer: Self-pay | Admitting: Neurology

## 2018-11-12 ENCOUNTER — Ambulatory Visit: Payer: Managed Care, Other (non HMO) | Admitting: Neurology

## 2018-11-12 VITALS — BP 123/81 | HR 70 | Temp 97.8°F | Ht 64.0 in | Wt 182.5 lb

## 2018-11-12 DIAGNOSIS — G2581 Restless legs syndrome: Secondary | ICD-10-CM

## 2018-11-12 DIAGNOSIS — G478 Other sleep disorders: Secondary | ICD-10-CM

## 2018-11-12 DIAGNOSIS — R682 Dry mouth, unspecified: Secondary | ICD-10-CM

## 2018-11-12 DIAGNOSIS — G4709 Other insomnia: Secondary | ICD-10-CM

## 2018-11-12 DIAGNOSIS — Z87891 Personal history of nicotine dependence: Secondary | ICD-10-CM

## 2018-11-12 DIAGNOSIS — F518 Other sleep disorders not due to a substance or known physiological condition: Secondary | ICD-10-CM | POA: Diagnosis not present

## 2018-11-12 DIAGNOSIS — D508 Other iron deficiency anemias: Secondary | ICD-10-CM | POA: Diagnosis not present

## 2018-11-12 DIAGNOSIS — R351 Nocturia: Secondary | ICD-10-CM

## 2018-11-12 DIAGNOSIS — D649 Anemia, unspecified: Secondary | ICD-10-CM | POA: Insufficient documentation

## 2018-11-12 NOTE — Patient Instructions (Signed)
Insomnia Insomnia is a sleep disorder that makes it difficult to fall asleep or stay asleep. Insomnia can cause fatigue, low energy, difficulty concentrating, mood swings, and poor performance at work or school. There are three different ways to classify insomnia:  Difficulty falling asleep.  Difficulty staying asleep.  Waking up too early in the morning. Any type of insomnia can be long-term (chronic) or short-term (acute). Both are common. Short-term insomnia usually lasts for three months or less. Chronic insomnia occurs at least three times a week for longer than three months. What are the causes? Insomnia may be caused by another condition, situation, or substance, such as:  Anxiety.  Certain medicines.  Gastroesophageal reflux disease (GERD) or other gastrointestinal conditions.  Asthma or other breathing conditions.  Restless legs syndrome, sleep apnea, or other sleep disorders.  Chronic pain.  Menopause.  Stroke.  Abuse of alcohol, tobacco, or illegal drugs.  Mental health conditions, such as depression.  Caffeine.  Neurological disorders, such as Alzheimer's disease.  An overactive thyroid (hyperthyroidism). Sometimes, the cause of insomnia may not be known. What increases the risk? Risk factors for insomnia include:  Gender. Women are affected more often than men.  Age. Insomnia is more common as you get older.  Stress.  Lack of exercise.  Irregular work schedule or working night shifts.  Traveling between different time zones.  Certain medical and mental health conditions. What are the signs or symptoms? If you have insomnia, the main symptom is having trouble falling asleep or having trouble staying asleep. This may lead to other symptoms, such as:  Feeling fatigued or having low energy.  Feeling nervous about going to sleep.  Not feeling rested in the morning.  Having trouble concentrating.  Feeling irritable, anxious, or depressed. How  is this diagnosed? This condition may be diagnosed based on:  Your symptoms and medical history. Your health care provider may ask about: ? Your sleep habits. ? Any medical conditions you have. ? Your mental health.  A physical exam. How is this treated? Treatment for insomnia depends on the cause. Treatment may focus on treating an underlying condition that is causing insomnia. Treatment may also include:  Medicines to help you sleep.  Counseling or therapy.  Lifestyle adjustments to help you sleep better. Follow these instructions at home: Eating and drinking   Limit or avoid alcohol, caffeinated beverages, and cigarettes, especially close to bedtime. These can disrupt your sleep.  Do not eat a large meal or eat spicy foods right before bedtime. This can lead to digestive discomfort that can make it hard for you to sleep. Sleep habits   Keep a sleep diary to help you and your health care provider figure out what could be causing your insomnia. Write down: ? When you sleep. ? When you wake up during the night. ? How well you sleep. ? How rested you feel the next day. ? Any side effects of medicines you are taking. ? What you eat and drink.  Make your bedroom a dark, comfortable place where it is easy to fall asleep. ? Put up shades or blackout curtains to block light from outside. ? Use a white noise machine to block noise. ? Keep the temperature cool.  Limit screen use before bedtime. This includes: ? Watching TV. ? Using your smartphone, tablet, or computer.  Stick to a routine that includes going to bed and waking up at the same times every day and night. This can help you fall asleep faster. Consider  making a quiet activity, such as reading, part of your nighttime routine.  Try to avoid taking naps during the day so that you sleep better at night.  Get out of bed if you are still awake after 15 minutes of trying to sleep. Keep the lights down, but try reading or  doing a quiet activity. When you feel sleepy, go back to bed. General instructions  Take over-the-counter and prescription medicines only as told by your health care provider.  Exercise regularly, as told by your health care provider. Avoid exercise starting several hours before bedtime.  Use relaxation techniques to manage stress. Ask your health care provider to suggest some techniques that may work well for you. These may include: ? Breathing exercises. ? Routines to release muscle tension. ? Visualizing peaceful scenes.  Make sure that you drive carefully. Avoid driving if you feel very sleepy.  Keep all follow-up visits as told by your health care provider. This is important. Contact a health care provider if:  You are tired throughout the day.  You have trouble in your daily routine due to sleepiness.  You continue to have sleep problems, or your sleep problems get worse. Get help right away if:  You have serious thoughts about hurting yourself or someone else. If you ever feel like you may hurt yourself or others, or have thoughts about taking your own life, get help right away. You can go to your nearest emergency department or call:  Your local emergency services (911 in the [place]  A suicide crisis helpline, such as the [place] at 203-403-9508. This is open 24 hours a day. Summary  Insomnia is a sleep disorder that makes it difficult to fall asleep or stay asleep.  Insomnia can be long-term (chronic) or short-term (acute).  Treatment for insomnia depends on the cause. Treatment may focus on treating an underlying condition that is causing insomnia.  Keep a sleep diary to help you and your health care provider figure out what could be causing your insomnia. This information is not intended to replace advice given to you by your health care provider. Make sure you discuss any questions you have with your health care provider. Document  Released: 12/30/1999 Document Revised: 12/14/2016 Document Reviewed: 10/11/2016 Elsevier Patient Education  2020 [place]. Restless Legs Syndrome Restless legs syndrome is a condition that causes uncomfortable feelings or sensations in the legs, especially while sitting or lying down. The sensations usually cause an overwhelming urge to move the legs. The arms can also sometimes be affected. The condition can range from mild to severe. The symptoms often interfere with a person's ability to sleep. What are the causes? The cause of this condition is not known. What increases the risk? The following factors may make you more likely to develop this condition:  Being older than 50.  Pregnancy.  Being a woman. In general, the condition is more common in women than in men.  A family history of the condition.  Having iron deficiency.  Overuse of caffeine, nicotine, or alcohol.  Certain medical conditions, such as kidney disease, Parkinson's disease, or nerve damage.  Certain medicines, such as those for high blood pressure, nausea, colds, allergies, depression, and some heart conditions. What are the signs or symptoms? The main symptom of this condition is uncomfortable sensations in the legs, such as:  Pulling.  Tingling.  Prickling.  Throbbing.  Crawling.  Burning. Usually, the sensations:  Affect both sides of the body.  Are worse  when you sit or lie down.  Are worse at night. These may wake you up or make it difficult to fall asleep.  Make you have a strong urge to move your legs.  Are temporarily relieved by moving your legs. The arms can also be affected, but this is rare. People who have this condition often have tiredness during the day because of their lack of sleep at night. How is this diagnosed? This condition may be diagnosed based on:  Your symptoms.  Blood tests. In some cases, you may be monitored in a sleep lab by a specialist (a sleep study).  This can detect any disruptions in your sleep. How is this treated? This condition is treated by managing the symptoms. This may include:  Lifestyle changes, such as exercising, using relaxation techniques, and avoiding caffeine, alcohol, or tobacco.  Medicines. Anti-seizure medicines may be tried first. Follow these instructions at home:     General instructions  Take over-the-counter and prescription medicines only as told by your health care provider.  Use methods to help relieve the uncomfortable sensations, such as: ? Massaging your legs. ? Walking or stretching. ? Taking a cold or hot bath.  Keep all follow-up visits as told by your health care provider. This is important. Lifestyle  Practice good sleep habits. For example, go to bed and get up at the same time every day. Most adults should get 7-9 hours of sleep each night.  Exercise regularly. Try to get at least 30 minutes of exercise most days of the week.  Practice ways of relaxing, such as yoga or meditation.  Avoid caffeine and alcohol.  Do not use any products that contain nicotine or tobacco, such as cigarettes and e-cigarettes. If you need help quitting, ask your health care provider. Contact a health care provider if:  Your symptoms get worse or they do not improve with treatment. Summary  Restless legs syndrome is a condition that causes uncomfortable feelings or sensations in the legs, especially while sitting or lying down.  The symptoms often interfere with a person's ability to sleep.  This condition is treated by managing the symptoms. You may need to make lifestyle changes or take medicines. This information is not intended to replace advice given to you by your health care provider. Make sure you discuss any questions you have with your health care provider. Document Released: 12/22/2001 Document Revised: 01/21/2017 Document Reviewed: 01/21/2017 Elsevier Patient Education  2020 [employer].

## 2018-11-12 NOTE — Progress Notes (Signed)
SLEEP MEDICINE CLINIC    Provider:  Larey Seat, MD  Primary Care Physician:  Unk Pinto, MD [address] [place] [address]     Referring Provider: Unk Pinto, [place],  [place] [address]          Chief Complaint according to patient   Patient presents with:    . New Patient (Initial Visit)           HISTORY OF PRESENT ILLNESS:  Monica [name] is a 58 y.o. year old Caucasian female patient and seen on 11/12/2018 .  Chief concern according to patient : I am always tired, exhausted and not sleeping well/ Nocturia". The patient reports non- restorative sleep, sleeping only 3-4 hors at night, waking with headaches.   Monica [name] is a right -handed White or Caucasian female with Non- restorative Sleep.  Her husband has stated she doesn't snore. She  has a past medical history of Anal fissure, DDD (degenerative disc disease), cervical, DDD (degenerative disc disease), lumbar, Hyperlipidemia, Hypertension, IBS (irritable bowel syndrome), Lower back pain, Neck pain, Plantar fasciitis of left foot, Plantar fasciitis of right foot, and Unspecified vitamin D deficiency.  She had multiple back and neck surgeries. Sleep relevant medical history: Nocturia times 3-4 , Sleep walking history,  Tonsillectomy: none, cervical spine surgery: yes - anterior fusion.     Family medical /sleep history: father has OSA,  on CPAP.   Social history: Patient is working as an Field seismologist. She lives in a household with husband - one adult son, 92. One grandchild. 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  The patient currently works at the office daytime.  Pets are present. One [employer] dog. Tobacco use: quit in 2016, 1/2 ppd- for 22.   ETOH use - seldomly , Caffeine intake in form of Coffee( 2 cup) Soda( none  Tea ( none) , no energy drinks. Regular exercise - none,"too  tired'     Sleep habits are as follows: The patient's dinner time is between 7 PM.  While watching Tv she developed an urge to move her legs, stretching , rubbing- RLS- The patient goes to bed at 9.30 PM and is asleep promptly, she continues to sleep for 1-2 hours, wakes for many bathroom breaks.   The preferred sleep position is lateral, with the support of 2-3 pillows.  Dreams are reportedly frequent/vivid and in ' full colors" and "crazy" .  When she wakes up and has trouble to go back to sleep she will  look at the clock and watch TV.  4.30 AM is the usual rise time.  The patient wakes up spontaneously before her alarm.  She reports not feeling refreshed or restored in AM, with symptoms such as dry mouth , morning headaches , and residual fatigue. By 3 pm she is tired, very tired.  Naps are taken infrequently, lasting hours , and often felt to be less refreshing than nocturnal sleep.    Review of Systems: Out of a complete 14 system review, the patient complains of only the following symptoms, and all other reviewed systems are negative.:  Fatigue, sleepiness , snoring, fragmented sleep, Insomnia    How likely are you to doze in the following situations: 0 = not likely, 1 = slight chance, 2 = moderate chance, 3 = high chance   Sitting and Reading? Watching Television? Sitting inactive in a public  place (theater or meeting)? As a passenger in a car for an hour without a break? Lying down in the afternoon when circumstances permit? Sitting and talking to someone? Sitting quietly after lunch without alcohol? In a car, while stopped for a few minutes in traffic?   Total = 8/ 24 points   FSS endorsed at 40/ 63 points.   Social History   Socioeconomic History  . Marital status: Married    Spouse name: Not on file  . Number of children: 1  . Years of education: Not on file  . Highest education level: Not on file  Occupational History  . Occupation: Web designer   Social Needs  . Financial resource strain: Not on file  . Food insecurity    Worry: Not on file    Inability: Not on file  . Transportation needs    Medical: Not on file    Non-medical: Not on file  Tobacco Use  . Smoking status: Former Smoker    Years: 22.00    Types: Cigarettes    Quit date: 09/16/2014    Years since quitting: 4.1  . Smokeless tobacco: Never Used  . Tobacco comment: Quit 4 years ago.  Substance and Sexual Activity  . Alcohol use: No    Alcohol/week: 0.0 standard drinks  . Drug use: No  . Sexual activity: Not on file  Lifestyle  . Physical activity    Days per week: Not on file    Minutes per session: Not on file  . Stress: Not on file  Relationships  . Social Herbalist on phone: Not on file    Gets together: Not on file    Attends religious service: Not on file    Active member of club or organization: Not on file    Attends meetings of clubs or organizations: Not on file    Relationship status: Not on file  Other Topics Concern  . Not on file  Social History Narrative  . Not on file    Family History  Problem Relation Age of Onset  . Heart disease Father   . Diabetes Father   . Hyperlipidemia Father   . Hypertension Father   . Cervical cancer Sister   . Hyperlipidemia Mother   . Hypertension Mother   . Stroke Maternal Aunt   . Cancer Maternal Aunt        breast x 2 different aunts  . Heart disease Maternal Grandmother   . Heart disease Maternal Grandfather   . Colon cancer Neg Hx   . Esophageal cancer Neg Hx   . Pancreatic cancer Neg Hx   . Rectal cancer Neg Hx   . Stomach cancer Neg Hx     Past Medical History:  Diagnosis Date  . Anal fissure   . DDD (degenerative disc disease), cervical   . DDD (degenerative disc disease), lumbar   . Hyperlipidemia   . Hypertension    Patient denies.  . IBS (irritable bowel syndrome)   . Lower back pain   . Neck pain   . Plantar fasciitis of left foot   . Plantar fasciitis of  right foot   . Unspecified vitamin D deficiency     Past Surgical History:  Procedure Laterality Date  . CESAREAN SECTION    . EYE SURGERY Bilateral 2020   catarats  . lower back     L4 and L5   . NECK SURGERY     metal plate and pins  in neck  . PLANTAR FASCIA SURGERY     both feet     Current Outpatient Medications on File Prior to Visit  Medication Sig Dispense Refill  . acyclovir (ZOVIRAX) 800 MG tablet Take 1 tablet (800 mg total) by mouth daily as needed. 90 tablet 1  . busPIRone (BUSPAR) 5 MG tablet Take 1 tablet 2 x /day as needed for Anxiety 180 tablet 0  . clobetasol ointment (TEMOVATE) AB-123456789 % Apply 1 application topically 2 (two) times daily as needed.    . Cyanocobalamin (B-12) 500 MCG TABS Take by mouth.    . cyclobenzaprine (FLEXERIL) 10 MG tablet cyclobenzaprine 10 mg tablet    . HYDROcodone-Acetaminophen (NORCO PO) Take by mouth as needed.    . Iron-Vitamin C (IRON 100/C) 100-250 MG TABS iron    . meclizine (ANTIVERT) 25 MG tablet Take 25 mg by mouth 3 (three) times daily as needed for dizziness.    . phentermine (ADIPEX-P) 37.5 MG tablet Take 1 tablet (37.5 mg total) by mouth daily before breakfast. 30 tablet 2  . vitamin C (ASCORBIC ACID) 500 MG tablet Take 500 mg by mouth daily.    Marland Kitchen VITAMIN D PO Take 5,000 Units by mouth.     No current facility-administered medications on file prior to visit.     Allergies  Allergen Reactions  . Aspirin Other (See Comments)    GI upset  . Mobic [Meloxicam] Other (See Comments)    GI upset  . Wellbutrin [Bupropion] Other (See Comments)    headache  . Z-Pak [Azithromycin] Other (See Comments)    thrush  . Penicillins Rash    Physical exam:  Today's Vitals   11/12/18 0850  BP: 123/81  Pulse: 70  Temp: 97.8 F (36.6 C)  Weight: 182 lb 8 oz (82.8 kg)  Height: 5\' 4"  (1.626 m)   Body mass index is 31.33 kg/m.   Wt Readings from Last 3 Encounters:  11/12/18 182 lb 8 oz (82.8 kg)  11/03/18 183 lb 12.8 oz (83.4  kg)  07/31/18 182 lb 9.6 oz (82.8 kg)     Ht Readings from Last 3 Encounters:  11/12/18 5\' 4"  (1.626 m)  11/03/18 5\' 4"  (1.626 m)  07/31/18 5' 4.5" (1.638 m)      General: The patient is awake, alert and appears not in acute distress. The patient is well groomed. Head: Normocephalic, atraumatic. Neck is supple. Mallampati 3,  neck circumference:14 inches . Nasal airflow patent.  Retrognathia is not seen. She has a small jaw.  Dental status: biological  Cardiovascular:  Regular rate and cardiac rhythm by pulse,  without distended neck veins. Respiratory: Lungs are clear to auscultation.  Skin:  Without evidence of ankle edema, or rash. Trunk: The patient's posture is erect.   Neurologic exam : The patient is awake and alert, oriented to place and time.   Memory subjective described as intact.  Attention span & concentration ability appears normal.  Speech is fluent,  without dysarthria, dysphonia or aphasia.  Mood and affect are appropriate.   Cranial nerves: no loss of smell or taste reported  Pupils are equal and briskly reactive to light. Funduscopic exam deferred. Status post cataract.  Extraocular movements in vertical and horizontal planes were intact and without nystagmus. No Diplopia. Visual fields by finger perimetry are intact. Hearing was intact to soft voice and finger rubbing.   Facial sensation intact to fine touch. Facial motor strength is symmetric and tongue and uvula move midline.  Neck  ROM : rotation, tilt and flexion extension were normal for age and shoulder shrug was symmetrical.    Motor exam:  Symmetric bulk, tone and ROM.   Normal tone without cog wheeling, symmetric grip strength .   Sensory:  Fine touch, pinprick and vibration were tested  and  normal.  Proprioception tested in the upper extremities was normal.   Coordination: Rapid alternating movements in the fingers/hands were of normal speed.  The Finger-to-nose maneuver was intact without  evidence of ataxia, dysmetria or tremor.   Gait and station: Patient could rise unassisted from a seated position, walked without assistive device.  Stance is of normal width/ base and the patient turned with 3 steps.  Toe and heel walk were deferred.  Deep tendon reflexes: in the  upper and lower extremities are symmetric and intact.  Babinski response was deferred .       After spending a total time of 40 minutes face to face and additional time for physical and neurologic examination, review of laboratory studies,  personal review of imaging studies, reports and results of other testing and review of referral information / records as far as provided in visit, I have established the following assessments:  1) Monica [name] is a 57 year old Caucasian female patient presenting with excessive fatigue rather than sleepiness.  Her husband has stated that she does not snore but she does wake up with a dry mouth which could also be medication related and there are morning headaches frequently reported, she also reports vivid dreams and frequent bathroom breaks, both fragmenting her sleep. She also stated that when at rest and just before she wants to go to bed she experiences restless legs sometimes been bad as well, the irresistible urge to move before she can go to sleep.  However she does not feel that she has an excessive prolonged sleep latency.  She sometimes wakes up very sore this could be a hint that she has limb movements during the night.  Given her long history of smoking I would like for this patient to be evaluated for obstructive sleep apnea, hypoxemia by an attended sleep study.  She also describes very clearly restless legs and we will obtain an iron level and a ferritin level.  An attended sleep study will help [place] also to see how many periodic limb movement they are.  Nocturia can be well related to obstructive sleep apnea and is often resolved when CPAP was initiated.  Patient's  BMI is elevated but she is not morbidly obese.  Her blood pressure here was normal.  Her vivid dreams could also be related to medication but usually medication suppresses vivid dreams the patient has not taken BuSpar as prescribed she has not filled the medication yet, and she does not need Antivert on a regular basis.  She started on 1 October with phentermine , assisting her in weight loss.    My Plan is to proceed with: non restorative sleep, fatigue, RLS and rule out apnea/ hypoxemia.   1)attended sleep study with REM montage, RLS, Nocturia, fragmented sleep.     I would like to thank Unk Pinto, MD and Unk Pinto, [place] [facility] [place],  [place] [address] for allowing me to meet with and to take care of this pleasant patient.   In short, Monica [name] is presenting with non restorative sleep, fatigue, RLS and is at risk for hypoxemia.  all symptom that can be attributed to anemia, iron deficiency and/ or  OSA.  I plan to follow up either personally or through our NP within 2-3 month.   CC: I will share my notes with PCP   Electronically signed by: Larey Seat, MD 11/12/2018 9:08 AM  [facility] Board certified by The [employer] of Sleep Medicine and Diplomate of the [employer] of Sleep Medicine. Board certified In Neurology through the [place], Fellow of the [employer] of Neurology. Medical Director of [facility].

## 2018-11-13 ENCOUNTER — Telehealth: Payer: Self-pay | Admitting: Neurology

## 2018-11-13 ENCOUNTER — Encounter: Payer: Self-pay | Admitting: Neurology

## 2018-11-13 LAB — IRON,TIBC AND FERRITIN PANEL
Ferritin: 129 ng/mL (ref 15–150)
Iron Saturation: 30 % (ref 15–55)
Iron: 75 ug/dL (ref 27–159)
Total Iron Binding Capacity: 248 ug/dL — ABNORMAL LOW (ref 250–450)
UIBC: 173 ug/dL (ref 131–425)

## 2018-11-13 NOTE — Telephone Encounter (Signed)
-----   Message from Larey Seat, MD sent at 11/13/2018  1:54 PM EDT ----- Iron capacity is actually low, ferritin level high- not a case of low iron causing RLS.

## 2018-11-13 NOTE — Telephone Encounter (Signed)
Called the patient to review the lab work, there was no answer. LVM for the patient to call back. I will also send a mychart message.

## 2018-12-02 ENCOUNTER — Telehealth: Payer: Self-pay | Admitting: Neurology

## 2018-12-02 NOTE — Telephone Encounter (Signed)
Pt called and LVM stating she was needing to cancel her Sleep Study due to personal reasons but will call back to r/s.

## 2019-02-02 NOTE — Progress Notes (Deleted)
Assessment and Plan:   Elevated blood pressure reading without diagnosis of hypertension - continue medications, DASH diet, exercise and monitor at home. Call if greater than 130/80.  -     CBC with Differential/Platelet -     COMPLETE METABOLIC PANEL WITH GFR -     TSH  Hyperlipidemia, unspecified hyperlipidemia type check lipids decrease fatty foods increase activity.  -     Lipid panel  obesity -     phentermine (ADIPEX-P) 37.5 MG tablet; Take 1 tablet (37.5 mg total) by mouth daily before breakfast. - follow up 3 months for progress monitoring - increase veggies, decrease carbs - long discussion about weight loss, diet, and exercise  B12 deficiency -     Ferritin -     Vitamin B12 -     Iron,Total/Total Iron Binding Cap  Vitamin D deficiency -     VITAMIN D 25 Hydroxy (Vit-D Deficiency, Fractures)  Abnormal glucose -     Hemoglobin A1c Discussed disease progression and risks Discussed diet/exercise, weight management and risk modification  Medication management -     Magnesium  Anxiety - start new medication prescribed, Buspar, stress management techniques discussed, increase water, good sleep hygiene discussed, increase exercise, and increase veggies. Follow up 3 months, call the office if any new AE's from medications and we will switch them.    Continue diet and meds as discussed. Further disposition pending results of labs. Over 30 minutes of exam, counseling, chart review, and critical decision making was performed  Future Appointments  Date Time Provider [place]  02/04/2019  8:45 AM Vicie Mutters, PA-C [facility] None  05/05/2019  8:45 AM Vicie Mutters, PA-C [facility] None  11/04/2019  9:00 AM Vicie Mutters, PA-C [facility] None     HPI 59 y.o. female  presents for 6 month follow up on hypertension, cholesterol, prediabetes, and vitamin D deficiency.   BMI is There is no height or weight on file to calculate BMI., she is working on diet  and exercise. She only drinks water.  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  She states she has severe fatigue.  Has never had sleep study. She has failed naltrexone, topamax, she is on phentermine and doing well.  Wt Readings from Last 3 Encounters:  11/12/18 182 lb 8 oz (82.8 kg)  11/03/18 183 lb 12.8 oz (83.4 kg)  07/31/18 182 lb 9.6 oz (82.8 kg)   Her blood pressure has been controlled at home, today their BP is     She does workout, has been walking her dog that has muscular issue and still working at grocery store on the weekend. She denies chest pain, shortness of breath, dizziness.  She is not on cholesterol medication and denies myalgias. Her cholesterol is at goal. The cholesterol last visit was:   Lab Results  Component Value Date   CHOL 174 11/03/2018   HDL 58 11/03/2018   LDLCALC 100 (H) 11/03/2018   TRIG 71 11/03/2018   CHOLHDL 3.0 11/03/2018   Last A1C in the office was:  Lab Results  Component Value Date   HGBA1C 5.3 11/03/2018   Patient is on Vitamin D supplement, she is on 5000 IU a day. She is on B12 sublingual.   Lab Results  Component Value Date   VD25OH 43 11/03/2018   Lab Results  Component Value Date   [id number] 11/03/2018   Lab Results  Component Value  Date   IRON 75 11/12/2018   TIBC 248 (L) 11/12/2018   FERRITIN 129 11/12/2018     Current Medications:  Current Outpatient Medications on File Prior to Visit  Medication Sig Dispense Refill  . acyclovir (ZOVIRAX) 800 MG tablet Take 1 tablet (800 mg total) by mouth daily as needed. 90 tablet 1  . busPIRone (BUSPAR) 5 MG tablet Take 1 tablet 2 x /day as needed for Anxiety 180 tablet 0  . clobetasol ointment (TEMOVATE) [id number] % Apply 1 application topically 2 (two) times daily as needed.    . Cyanocobalamin (B-12) 500 MCG TABS Take by mouth.    . cyclobenzaprine (FLEXERIL) 10 MG tablet cyclobenzaprine 10 mg tablet    . HYDROcodone-Acetaminophen (NORCO  PO) Take by mouth as needed.    . Iron-Vitamin C (IRON 100/C) 100-250 MG TABS iron    . meclizine (ANTIVERT) 25 MG tablet Take 25 mg by mouth 3 (three) times daily as needed for dizziness.    . phentermine (ADIPEX-P) 37.5 MG tablet Take 1 tablet (37.5 mg total) by mouth daily before breakfast. 30 tablet 2  . vitamin C (ASCORBIC ACID) 500 MG tablet Take 500 mg by mouth daily.    Marland Kitchen VITAMIN D PO Take 5,000 Units by mouth.     No current facility-administered medications on file prior to visit.   Medical History:  Past Medical History:  Diagnosis Date  . Anal fissure   . DDD (degenerative disc disease), cervical   . DDD (degenerative disc disease), lumbar   . Hyperlipidemia   . Hypertension    Patient denies.  . IBS (irritable bowel syndrome)   . Lower back pain   . Neck pain   . Plantar fasciitis of left foot   . Plantar fasciitis of right foot   . Unspecified vitamin D deficiency    Allergies:  Allergies  Allergen Reactions  . Aspirin Other (See Comments)    GI upset  . Mobic [Meloxicam] Other (See Comments)    GI upset  . Wellbutrin [Bupropion] Other (See Comments)    headache  . Z-Pak [Azithromycin] Other (See Comments)    thrush  . Penicillins Rash     Review of Systems:  Review of Systems  Constitutional: Positive for malaise/fatigue. Negative for chills, diaphoresis, fever and weight loss.  HENT: Negative.   Eyes: Negative.   Respiratory: Negative.   Cardiovascular: Negative.   Gastrointestinal: Negative.   Genitourinary: Negative.   Musculoskeletal: Negative.   Skin: Negative.   Neurological: Negative.  Negative for weakness.  Endo/Heme/Allergies: Negative.   Psychiatric/Behavioral: Negative.     Family history- Review and unchanged Social history- Review and unchanged Physical Exam: LMP 05/15/2010  Wt Readings from Last 3 Encounters:  11/12/18 182 lb 8 oz (82.8 kg)  11/03/18 183 lb 12.8 oz (83.4 kg)  07/31/18 182 lb 9.6 oz (82.8 kg)   General  Appearance: Well nourished, in no apparent distress. Eyes: PERRLA, EOMs, conjunctiva no swelling or erythema Sinuses: No Frontal/maxillary tenderness ENT/Mouth: Ext aud canals clear, TMs without erythema, bulging. No erythema, swelling, or exudate on post pharynx.  Tonsils not swollen or erythematous. Hearing normal.  Neck: Supple, thyroid normal.  Respiratory: Respiratory effort normal, BS equal bilaterally without rales, rhonchi, wheezing or stridor.  Cardio: RRR with no MRGs. Brisk peripheral pulses without edema.  Abdomen: Soft, + BS,  Non tender, no guarding, rebound, hernias, masses. Lymphatics: Non tender without lymphadenopathy.  Musculoskeletal: Full ROM, 5/5 strength, Normal gait Skin: Warm, dry without rashes,  lesions, ecchymosis.  Neuro: Cranial nerves intact. Normal muscle tone, no cerebellar symptoms. Psych: Awake and oriented X 3, normal affect, Insight and Judgment appropriate.    Vicie Mutters, PA-C 9:38 AM [facility]

## 2019-02-04 ENCOUNTER — Ambulatory Visit: Payer: Managed Care, Other (non HMO) | Admitting: Physician Assistant

## 2019-04-30 DIAGNOSIS — F419 Anxiety disorder, unspecified: Secondary | ICD-10-CM | POA: Insufficient documentation

## 2019-04-30 NOTE — Progress Notes (Signed)
Assessment and Plan:   Elevated blood pressure reading without diagnosis of hypertension - continue medications, DASH diet, exercise and monitor at home. Call if greater than 130/80.  -     CBC with Differential/Platelet -     COMPLETE METABOLIC PANEL WITH GFR -     TSH  Hyperlipidemia, unspecified hyperlipidemia type check lipids decrease fatty foods increase activity.  -     Lipid panel  obesity -     phentermine (ADIPEX-P) 37.5 MG tablet; Take 1 tablet (37.5 mg total) by mouth daily before breakfast. -     topiramate 25 mg tablet; take 1-2 tablets at bedtime for weight.  - increase veggies, fruits, whole grains, beans, decrease processed carbs -   Portion sizes discussed and managing stress eating  - long discussion about weight loss, diet, and exercise  Vitamin D deficiency Continue supplement  Abnormal glucose Recent A1Cs at goal Discussed diet/exercise, weight management  Defer A1C; check CMP  Medication management -     Magnesium  Anxiety She prefers to avoid medication for how; does believe she has access to counselor via work, encouraged her to reach out for counseling for anxiety/stress and also weight loss stress management techniques discussed, increase water, good sleep hygiene discussed, increase exercise, and increase veggies.    Continue diet and meds as discussed. Further disposition pending results of labs. Over 30 minutes of exam, counseling, chart review, and critical decision making was performed  Future Appointments  Date Time Provider [place]  11/04/2019  9:00 AM Vicie Mutters, PA-C [facility] None     HPI 59 y.o. female  presents for 6 month follow up on hypertension, cholesterol, prediabetes, and vitamin D deficiency.   She had cataract surgery and needs to have another, she did a synvisc shot in her right knee but states she will have surgery eventually. Son had baby Oct had back surgery, she was helping over there but he is doing  better.  She is prescribed buspar 5 mg BID for anxiety, but reports hasn't taken, wary of taking another medication. Attributes anxiety to stress, wants to manage by lifestyle.   She was referred to Dr. Brett Fairy for sleep study due to severe fatigue, RLS; sleep study was planned but cancelled due to personal reasons - she reports was moving at work, does plan to reschedule this.   BMI is Body mass index is 32.61 kg/m., she is working on diet and exercise. She only drinks water.  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  She states she has severe fatigue.  Has never had sleep study. She has failed naltrexone, topamax, she has been on phentermine with benefit but has been off.  Wt Readings from Last 3 Encounters:  05/05/19 190 lb (86.2 kg)  11/12/18 182 lb 8 oz (82.8 kg)  11/03/18 183 lb 12.8 oz (83.4 kg)   Her blood pressure has been controlled at home, today their BP is BP: 122/80   She does not workout, working at grocery store on the weekend, lots of walking. She denies chest pain, shortness of breath, dizziness.   She is not on cholesterol medication and denies myalgias. Her cholesterol is at goal. The cholesterol last visit was:   Lab Results  Component Value Date   CHOL 174 11/03/2018   HDL 58 11/03/2018   LDLCALC 100 (H) 11/03/2018   TRIG 71 11/03/2018   CHOLHDL 3.0 11/03/2018   Last  A1C in the office was:  Lab Results  Component Value Date   HGBA1C 5.3 11/03/2018   Patient is on Vitamin D supplement, she is on 5000 IU a day.   Lab Results  Component Value Date   VD25OH 73 11/03/2018   She is on B12 sublingual.  Lab Results  Component Value Date   [id number] 11/03/2018      Current Medications:  Current Outpatient Medications on File Prior to Visit  Medication Sig Dispense Refill  . acyclovir (ZOVIRAX) 800 MG tablet Take 1 tablet (800 mg total) by mouth daily as needed. 90 tablet 1  . busPIRone (BUSPAR) 5 MG tablet  Take 1 tablet 2 x /day as needed for Anxiety 180 tablet 0  . clobetasol ointment (TEMOVATE) [id number] % Apply 1 application topically 2 (two) times daily as needed.    . Cyanocobalamin (B-12) 500 MCG TABS Take by mouth.    Marland Kitchen HYDROcodone-Acetaminophen (NORCO PO) Take by mouth as needed.    . meclizine (ANTIVERT) 25 MG tablet Take 25 mg by mouth 3 (three) times daily as needed for dizziness.    . vitamin C (ASCORBIC ACID) 500 MG tablet Take 500 mg by mouth daily.    Marland Kitchen VITAMIN D PO Take 5,000 Units by mouth.    . cyclobenzaprine (FLEXERIL) 10 MG tablet cyclobenzaprine 10 mg tablet    . Iron-Vitamin C (IRON 100/C) 100-250 MG TABS iron    . phentermine (ADIPEX-P) 37.5 MG tablet Take 1 tablet (37.5 mg total) by mouth daily before breakfast. (Patient not taking: Reported on 05/05/2019) 30 tablet 2   No current facility-administered medications on file prior to visit.   Medical History:  Past Medical History:  Diagnosis Date  . Anal fissure   . DDD (degenerative disc disease), cervical   . DDD (degenerative disc disease), lumbar   . Hyperlipidemia   . Hypertension    Patient denies.  . IBS (irritable bowel syndrome)   . Lower back pain   . Neck pain   . Plantar fasciitis of left foot   . Plantar fasciitis of right foot   . Unspecified vitamin D deficiency    Allergies:  Allergies  Allergen Reactions  . Aspirin Other (See Comments)    GI upset  . Mobic [Meloxicam] Other (See Comments)    GI upset  . Wellbutrin [Bupropion] Other (See Comments)    headache  . Z-Pak [Azithromycin] Other (See Comments)    thrush  . Penicillins Rash     Review of Systems:  Review of Systems  Constitutional: Positive for malaise/fatigue. Negative for chills, diaphoresis, fever and weight loss.  HENT: Negative.   Eyes: Negative.   Respiratory: Negative.   Cardiovascular: Negative.   Gastrointestinal: Negative.   Genitourinary: Negative.   Musculoskeletal: Negative.   Skin: Negative.   Neurological:  Negative.  Negative for weakness.  Endo/Heme/Allergies: Negative.   Psychiatric/Behavioral: Negative for depression ( ) and suicidal ideas. The patient is nervous/anxious. The patient does not have insomnia.     Family history- Review and unchanged Social history- Review and unchanged Physical Exam: BP 122/80   Pulse 72   Temp (!) 97.5 F (36.4 C)   Ht 5\' 4"  (1.626 m)   Wt 190 lb (86.2 kg)   LMP 05/15/2010   SpO2 97%   BMI 32.61 kg/m  Wt Readings from Last 3 Encounters:  05/05/19 190 lb (86.2 kg)  11/12/18 182 lb 8 oz (82.8 kg)  11/03/18 183 lb 12.8 oz (83.4 kg)  General Appearance: Well nourished, in no apparent distress. Eyes: PERRLA, EOMs, conjunctiva no swelling or erythema Sinuses: No Frontal/maxillary tenderness ENT/Mouth: Ext aud canals clear, TMs without erythema, bulging. No erythema, swelling, or exudate on post pharynx.  Tonsils not swollen or erythematous. Hearing normal.  Neck: Supple, thyroid normal.  Respiratory: Respiratory effort normal, BS equal bilaterally without rales, rhonchi, wheezing or stridor.  Cardio: RRR with no MRGs. Brisk peripheral pulses without edema.  Abdomen: Soft, + BS,  Non tender, no guarding, rebound, hernias, masses. Lymphatics: Non tender without lymphadenopathy.  Musculoskeletal: Full ROM, 5/5 strength, Normal gait Skin: Warm, dry without rashes, lesions, ecchymosis.  Neuro: Cranial nerves intact. Normal muscle tone, no cerebellar symptoms. Psych: Awake and oriented X 3, normal affect, Insight and Judgment appropriate.    Izora Ribas, NP 8:58 AM [facility]

## 2019-05-05 ENCOUNTER — Other Ambulatory Visit: Payer: Self-pay

## 2019-05-05 ENCOUNTER — Ambulatory Visit: Payer: Managed Care, Other (non HMO) | Admitting: Adult Health

## 2019-05-05 ENCOUNTER — Encounter: Payer: Self-pay | Admitting: Adult Health

## 2019-05-05 ENCOUNTER — Ambulatory Visit: Payer: Managed Care, Other (non HMO) | Admitting: Physician Assistant

## 2019-05-05 VITALS — BP 122/80 | HR 72 | Temp 97.5°F | Ht 64.0 in | Wt 190.0 lb

## 2019-05-05 DIAGNOSIS — G4700 Insomnia, unspecified: Secondary | ICD-10-CM | POA: Diagnosis not present

## 2019-05-05 DIAGNOSIS — R030 Elevated blood-pressure reading, without diagnosis of hypertension: Secondary | ICD-10-CM

## 2019-05-05 DIAGNOSIS — E785 Hyperlipidemia, unspecified: Secondary | ICD-10-CM

## 2019-05-05 DIAGNOSIS — Z79899 Other long term (current) drug therapy: Secondary | ICD-10-CM | POA: Diagnosis not present

## 2019-05-05 DIAGNOSIS — F419 Anxiety disorder, unspecified: Secondary | ICD-10-CM

## 2019-05-05 DIAGNOSIS — Z6831 Body mass index (BMI) 31.0-31.9, adult: Secondary | ICD-10-CM | POA: Diagnosis not present

## 2019-05-05 DIAGNOSIS — D508 Other iron deficiency anemias: Secondary | ICD-10-CM

## 2019-05-05 DIAGNOSIS — Z87891 Personal history of nicotine dependence: Secondary | ICD-10-CM

## 2019-05-05 MED ORDER — PHENTERMINE HCL 37.5 MG PO TABS: 37.5000 mg | ORAL_TABLET | Freq: Every day | ORAL | 2 refills | Status: DC

## 2019-05-05 MED ORDER — ACYCLOVIR 800 MG PO TABS: 800.0000 mg | ORAL_TABLET | Freq: Every day | ORAL | 3 refills | Status: AC | PRN

## 2019-05-05 MED ORDER — TOPIRAMATE 25 MG PO TABS: ORAL_TABLET | ORAL | 2 refills | Status: DC

## 2019-05-05 NOTE — Patient Instructions (Signed)
Mediterranean Diet A Mediterranean diet refers to food and lifestyle choices that are based on the traditions of countries located on the [employer]. This way of eating has been shown to help prevent certain conditions and improve outcomes for people who have chronic diseases, like kidney disease and heart disease. What are tips for following this plan? Lifestyle  Cook and eat meals together with your family, when possible.  Drink enough fluid to keep your urine clear or pale yellow.  Be physically active every day. This includes: ? Aerobic exercise like running or swimming. ? Leisure activities like gardening, walking, or housework.  Get 7-8 hours of sleep each night.  If recommended by your health care provider, drink red wine in moderation. This means 1 glass a day for nonpregnant women and 2 glasses a day for men. A glass of wine equals 5 oz (150 mL). Reading food labels   Check the serving size of packaged foods. For foods such as rice and pasta, the serving size refers to the amount of cooked product, not dry.  Check the total fat in packaged foods. Avoid foods that have saturated fat or trans fats.  Check the ingredients list for added sugars, such as corn syrup. Shopping  At the grocery store, buy most of your food from the areas near the walls of the store. This includes: ? Fresh fruits and vegetables (produce). ? Grains, beans, nuts, and seeds. Some of these may be available in unpackaged forms or large amounts (in bulk). ? Fresh seafood. ? Poultry and eggs. ? Low-fat dairy products.  Buy whole ingredients instead of prepackaged foods.  Buy fresh fruits and vegetables in-season from local farmers markets.  Buy frozen fruits and vegetables in resealable bags.  If you do not have access to quality fresh seafood, buy precooked frozen shrimp or canned fish, such as tuna, salmon, or sardines.  Buy small amounts of raw or cooked vegetables, salads, or olives from  the deli or salad bar at your store.  Stock your pantry so you always have certain foods on hand, such as olive oil, canned tuna, canned tomatoes, rice, pasta, and beans. Cooking  Cook foods with extra-virgin olive oil instead of using butter or other vegetable oils.  Have meat as a side dish, and have vegetables or grains as your main dish. This means having meat in small portions or adding small amounts of meat to foods like pasta or stew.  Use beans or vegetables instead of meat in common dishes like chili or lasagna.  Experiment with different cooking methods. Try roasting or broiling vegetables instead of steaming or sauteing them.  Add frozen vegetables to soups, stews, pasta, or rice.  Add nuts or seeds for added healthy fat at each meal. You can add these to yogurt, salads, or vegetable dishes.  Marinate fish or vegetables using olive oil, lemon juice, garlic, and fresh herbs. Meal planning   Plan to eat 1 vegetarian meal one day each week. Try to work up to 2 vegetarian meals, if possible.  Eat seafood 2 or more times a week.  Have healthy snacks readily available, such as: ? Vegetable sticks with hummus. ? [place] yogurt. ? Fruit and nut trail mix.  Eat balanced meals throughout the week. This includes: ? Fruit: 2-3 servings a day ? Vegetables: 4-5 servings a day ? Low-fat dairy: 2 servings a day ? Fish, poultry, or lean meat: 1 serving a day ? Beans and legumes: 2 or more servings a week ?  Nuts and seeds: 1-2 servings a day ? Whole grains: 6-8 servings a day ? Extra-virgin olive oil: 3-4 servings a day  Limit red meat and sweets to only a few servings a month What are my food choices?  Mediterranean diet ? Recommended  Grains: Whole-grain pasta. Brown rice. Bulgar wheat. Polenta. Couscous. Whole-wheat bread. [place].  Vegetables: Artichokes. Beets. Broccoli. Cabbage. Carrots. Eggplant. Green beans. Chard. Kale. Spinach. Onions. Leeks. Peas. Squash.  Tomatoes. Peppers. Radishes.  Fruits: Apples. Apricots. Avocado. Berries. Bananas. Cherries. Dates. Figs. Grapes. Lemons. Melon. Oranges. Peaches. Plums. Pomegranate.  Meats and other protein foods: Beans. Almonds. Sunflower seeds. Pine nuts. Peanuts. [place]. Salmon. Scallops. Shrimp. [place]. Tilapia. Clams. Oysters. Eggs.  Dairy: Low-fat milk. Cheese. Greek yogurt.  Beverages: Water. Red wine. Herbal tea.  Fats and oils: Extra virgin olive oil. Avocado oil. Grape seed oil.  Sweets and desserts: [place] yogurt with honey. Baked apples. Poached pears. Trail mix.  Seasoning and other foods: Basil. Cilantro. Coriander. Cumin. Mint. Parsley. Sage. Rosemary. Tarragon. Garlic. Oregano. Thyme. Pepper. Balsalmic vinegar. Tahini. Hummus. Tomato sauce. Olives. Mushrooms. ? Limit these  Grains: Prepackaged pasta or rice dishes. Prepackaged cereal with added sugar.  Vegetables: Deep fried potatoes (french fries).  Fruits: Fruit canned in syrup.  Meats and other protein foods: Beef. Pork. Lamb. Poultry with skin. Hot dogs. Berniece Salines.  Dairy: Ice cream. Sour cream. Whole milk.  Beverages: Juice. Sugar-sweetened soft drinks. Beer. Liquor and spirits.  Fats and oils: Butter. Canola oil. Vegetable oil. Beef fat (tallow). Lard.  Sweets and desserts: Cookies. Cakes. Pies. Candy.  Seasoning and other foods: Mayonnaise. Premade sauces and marinades. The items listed may not be a complete list. Talk with your dietitian about what dietary choices are right for you. Summary  The Mediterranean diet includes both food and lifestyle choices.  Eat a variety of fresh fruits and vegetables, beans, nuts, seeds, and whole grains.  Limit the amount of red meat and sweets that you eat.  Talk with your health care provider about whether it is safe for you to drink red wine in moderation. This means 1 glass a day for nonpregnant women and 2 glasses a day for men. A glass of wine equals 5 oz (150 mL). This information  is not intended to replace advice given to you by your health care provider. Make sure you discuss any questions you have with your health care provider. Document Revised: 09/01/2015 Document Reviewed: 08/25/2015 Elsevier Patient Education  [place].      Phentermine; Topiramate extended-release capsules What is this medicine? Phentermine; Topiramate (FEN ter meen; Toe PYRE a mate) is a combination of two drugs that decrease appetite and help you lose weight. This product is used with a reduced calorie diet and exercise. This product can also help you maintain weight loss. This medicine may be used for other purposes; ask your health care provider or pharmacist if you have questions. COMMON BRAND NAME(S): Qsymia What should I tell my health care provider before I take this medicine? They need to know if you have any of these conditions:  bone problems  depression or other mental illness  diabetes  diarrhea  glaucoma  having surgery  heart disease  high blood pressure  history of drug abuse or alcohol abuse problem  history of heart attack or stroke  history of irregular heartbeat  kidney disease or stones  liver disease  low levels of potassium in the blood  lung or breathing disease, like asthma  metabolic acidosis  on a ketogenic diet  seizures  suicidal thoughts, plans, or attempt; a previous suicide attempt by you or a family member  taken an MAOI like Carbex, Eldepryl, Marplan, Nardil, or Parnate in last 14 days  thyroid disease  an unusual or allergic reaction to phentermine, topiramate, other medicines, foods, dyes, or preservatives  pregnant or trying to get pregnant  breast-feeding How should I use this medicine? Take this medicine by mouth with a glass of water. Follow the directions on the prescription label. Do not crush or chew. This medicine is usually taken with or without food once per day in the morning. Avoid taking this  medicine in the evening. It may interfere with sleep. Take your doses at regular intervals. Do not take your medicine more often than directed. A special MedGuide will be given to you by the pharmacist with each prescription and refill. Be sure to read this information carefully each time. Talk to your pediatrician regarding the use of this medicine in children. Special care may be needed. Overdosage: If you think you have taken too much of this medicine contact a poison control center or emergency room at once. NOTE: This medicine is only for you. Do not share this medicine with others. What if I miss a dose? If you miss a dose, skip it. Take your next dose at the normal time. Do not take extra or 2 doses at the same time to make up for the missed dose. What may interact with this medicine? Do not take this medicine with any of the following medications:  MAOIs like Carbex, Eldepryl, Marplan, Nardil, and Parnate This medicine may also interact with the following medications:  acetazolamide  alcohol  antihistamines for allergy, cough and cold  atropine  birth control pills  carbamazepine  certain medicines for bladder problems like oxybutynin, tolterodine  certain medicines for depression, anxiety, or psychotic disturbances  certain medicines for high blood pressure  certain medicines for Parkinson's disease like benztropine, trihexyphenidyl  certain medicines for sleep  certain medicines for stomach problems like dicyclomine, hyoscyamine  certain medicines for travel sickness like scopolamine  dichlorphenamide  digoxin  diuretics  linezolid  medicines for colds or breathing difficulties like pseudoephedrine or phenylephrine  medicines for diabetes  methazolamide  narcotic medicines for pain  phenytoin  sibutramine  stimulant medicines for attention disorders, weight loss, or to stay awake  valproic acid  zonisamide This list may not describe all  possible interactions. Give your health care provider a list of all the medicines, herbs, non-prescription drugs, or dietary supplements you use. Also tell them if you smoke, drink alcohol, or use illegal drugs. Some items may interact with your medicine. What should I watch for while using this medicine? Visit your doctor or health care provider for regular checks on your progress. Do not stop taking except on your health care provider's advice. You may develop a severe reaction. Your health care provider will tell you how much medicine to take. Do not take this medicine close to bedtime. It may prevent you from sleeping. Avoid extreme heat. This medicine can cause you to sweat less than normal. Your body temperature could increase to dangerous levels, which may lead to heat stroke. You should drink plenty of fluids while taking this medicine. If you have had kidney stones in the past, this will help to reduce your chances of forming kidney stones. You may get drowsy or dizzy. Do not drive, use machinery, or do anything that needs mental alertness  until you know how this medicine affects you. Do not stand or sit up quickly, especially if you are an older patient. This reduces the risk of dizzy or fainting spells. Alcohol may increase dizziness and drowsiness. Avoid alcoholic drinks. This medicine may affect blood sugar levels. Ask your healthcare provider if changes in diet or medicines are needed if you have diabetes. Check with your health care professional if you have severe diarrhea, nausea, and vomiting, or if you sweat a lot. The loss of too much body fluid may make it dangerous for you to take this medicine. Tell your health care professional right away if you have any change in your eyesight. Patients and their families should watch out for new or worsening depression or thoughts of suicide. Also watch out for sudden changes in feelings such as feeling anxious, agitated, panicky, irritable,  hostile, aggressive, impulsive, severely restless, overly excited and hyperactive, or not being able to sleep. If this happens, especially at the beginning of treatment or after a change in dose, call your healthcare professional. This medicine may cause serious skin reactions. They can happen weeks to months after starting the medicine. Contact your health care provider right away if you notice fevers or flu-like symptoms with a rash. The rash may be red or purple and then turn into blisters or peeling of the skin. Or, you might notice a red rash with swelling of the face, lips or lymph nodes in your neck or under your arms. Birth control may not work properly while you are taking this medicine. Talk to your health care professional about using an extra method of birth control. Women should inform their health care provider if they wish to become pregnant or think they might be pregnant. There is a potential for serious side effects and harm to an unborn child. Losing weight while pregnant is not advised and may cause harm to the unborn child. Talk to your health care provider for more information. What side effects may I notice from receiving this medicine? Side effects that you should report to your doctor or health care professional as soon as possible:  allergic reactions like skin rash, itching or hives, swelling of the face, lips, or tongue  anxious  breathing problems  changes in vision  chest pain or chest tightness  depressed mood or other mood changes  fast, irregular heartbeat  increased blood pressure  irritable  pain, tingling, numbness in the hands or feet  redness, blistering, peeling or loosening of the skin, including inside the mouth  restlessness  seizures  signs and symptoms of increased acid in the body like breathing fast; fast heartbeat; headache; confusion; unusually weak or tired; nausea, vomiting  signs and symptoms of kidney stones like blood in urine;  pain in the lower back or side; pain when urinating  signs and symptoms of a stroke like changes in vision; confusion; trouble speaking or understanding; severe headaches; sudden numbness or weakness of the face, arm or leg; trouble walking; dizziness; loss of balance or coordination  suicidal thoughts  trouble passing urine or change in the amount of urine Side effects that usually do not require medical attention (report to your doctor or health care professional if they continue or are bothersome):  changes in taste  constipation  dizziness  dry mouth  headache  trouble sleeping This list may not describe all possible side effects. Call your doctor for medical advice about side effects. You may report side effects to FDA at 1-800-FDA-1088.  Where should I keep my medicine? Keep out of the reach of children. This medicine can be abused. Keep your medicine in a safe place to protect it from theft. Do not share this medicine with anyone. Selling or giving away this medicine is dangerous and against the law. This medicine may cause harm and death if it is taken by other adults, children, or pets. Return medicine that has not been used to an official disposal site. Contact the DEA at 763-658-9116 or your city/county government to find a site. If you cannot return the medicine, mix any unused medicine with a substance like cat litter or coffee grounds. Then throw the medicine away in a sealed container like a sealed bag or coffee can with a lid. Do not use the medicine after the expiration date. Store at room temperature between 15 and 25 degrees C (59 and 77 degrees F). NOTE: This sheet is a summary. It may not cover all possible information. If you have questions about this medicine, talk to your doctor, pharmacist, or health care provider.  2020 Elsevier/Gold Standard (2018-11-07 12:44:21)

## 2019-05-06 LAB — CBC WITH DIFFERENTIAL/PLATELET
Absolute Monocytes: 540 cells/uL (ref 200–950)
Basophils Absolute: 36 cells/uL (ref 0–200)
Basophils Relative: 0.4 %
Eosinophils Absolute: 144 cells/uL (ref 15–500)
Eosinophils Relative: 1.6 %
HCT: 41.4 % (ref 35.0–45.0)
Hemoglobin: 13.1 g/dL (ref 11.7–15.5)
Lymphs Abs: 3051 cells/uL (ref 850–3900)
MCH: 25.9 pg — ABNORMAL LOW (ref 27.0–33.0)
MCHC: 31.6 g/dL — ABNORMAL LOW (ref 32.0–36.0)
MCV: 82 fL (ref 80.0–100.0)
MPV: 10.1 fL (ref 7.5–12.5)
Monocytes Relative: 6 %
Neutro Abs: 5229 cells/uL (ref 1500–7800)
Neutrophils Relative %: 58.1 %
Platelets: 281 10*3/uL (ref 140–400)
RBC: 5.05 10*6/uL (ref 3.80–5.10)
RDW: 12.6 % (ref 11.0–15.0)
Total Lymphocyte: 33.9 %
WBC: 9 10*3/uL (ref 3.8–10.8)

## 2019-05-06 LAB — COMPLETE METABOLIC PANEL WITH GFR
AG Ratio: 1.8 (calc) (ref 1.0–2.5)
ALT: 7 U/L (ref 6–29)
AST: 14 U/L (ref 10–35)
Albumin: 4.3 g/dL (ref 3.6–5.1)
Alkaline phosphatase (APISO): 90 U/L (ref 37–153)
BUN: 14 mg/dL (ref 7–25)
CO2: 26 mmol/L (ref 20–32)
Calcium: 9.6 mg/dL (ref 8.6–10.4)
Chloride: 108 mmol/L (ref 98–110)
Creat: 0.58 mg/dL (ref 0.50–1.05)
GFR, Est African American: 117 mL/min/{1.73_m2} (ref >=60)
GFR, Est Non African American: 101 mL/min/{1.73_m2} (ref >=60)
Globulin: 2.4 g/dL (calc) (ref 1.9–3.7)
Glucose, Bld: 96 mg/dL (ref 65–99)
Potassium: 4.4 mmol/L (ref 3.5–5.3)
Sodium: 141 mmol/L (ref 135–146)
Total Bilirubin: 0.4 mg/dL (ref 0.2–1.2)
Total Protein: 6.7 g/dL (ref 6.1–8.1)

## 2019-05-06 LAB — LIPID PANEL
Cholesterol: 199 mg/dL (ref <200)
HDL: 63 mg/dL (ref >=50)
LDL Cholesterol (Calc): 115 mg/dL (calc) — ABNORMAL HIGH
Non-HDL Cholesterol (Calc): 136 mg/dL (calc) — ABNORMAL HIGH (ref <130)
Total CHOL/HDL Ratio: 3.2 (calc) (ref <5.0)
Triglycerides: 103 mg/dL (ref <150)

## 2019-05-06 LAB — TSH: TSH: 1.25 mIU/L (ref 0.40–4.50)

## 2019-05-06 LAB — MAGNESIUM: Magnesium: 2.3 mg/dL (ref 1.5–2.5)

## 2019-08-15 ENCOUNTER — Other Ambulatory Visit: Payer: Self-pay | Admitting: Adult Health

## 2019-08-30 ENCOUNTER — Other Ambulatory Visit: Payer: Self-pay | Admitting: Adult Health

## 2019-08-30 DIAGNOSIS — Z6831 Body mass index (BMI) 31.0-31.9, adult: Secondary | ICD-10-CM

## 2019-10-25 ENCOUNTER — Other Ambulatory Visit: Payer: Self-pay | Admitting: Adult Health

## 2019-11-02 NOTE — Progress Notes (Signed)
Complete Physical  Assessment and Plan:  Encounter for general adult medical examination with abnormal findings 1 year  Elevated blood pressure reading without diagnosis of hypertension - continue medications, DASH diet, exercise and monitor at home. Call if greater than 130/80.  -     CBC with Differential/Platelet -     COMPLETE METABOLIC PANEL WITH GFR -     TSH -     Urinalysis, Routine w reflex microscopic -     Microalbumin / creatinine urine ratio -     EKG 12-Lead  Hyperlipidemia, unspecified hyperlipidemia type check lipids decrease fatty foods increase activity.  -     Lipid panel -     EKG 12-Lead  B12 deficiency -     Vitamin B12  Vitamin D deficiency -     VITAMIN D 25 Hydroxy (Vit-D Deficiency, Fractures)  HLA B27 (HLA B27 positive) Monitor, otherwise negative workup, sicca  History of IBS Diet controlled  History of tobacco use  commended patient for quitting and reviewed strategies for preventing relapses  DDD (degenerative disc disease), cervical Monitor  Obesity - BMI 31 -     phentermine (ADIPEX-P) 37.5 MG tablet; Take 1 tablet (37.5 mg total) by mouth daily before breakfast. - follow up 6 months for progress monitoring - increase veggies, decrease carbs - long discussion about weight loss, diet, and exercise  Medication management -     Magnesium  Abnormal glucose -     Hemoglobin A1c -Discussed disease progression and risks Discussed diet/exercise, weight management and risk modification  Insomnia, unspecified type - reports has improved with lifestyle, never got sleep study but declines further at this time  - no concerning red flags   Discussed med's effects and SE's. Screening labs and tests as requested with regular follow-up as recommended. Over 40 minutes of exam, counseling, chart review, and complex, high level critical decision making was performed this visit.  Future Appointments  Date Time Provider [place]   05/05/2020  8:45 AM Liane Comber, NP [facility] None  11/03/2020  9:00 AM Liane Comber, NP [facility] None    HPI  59 y.o. female  presents for a complete physical and follow up for has Family history of ischemic heart disease; Vitamin D deficiency; Elevated blood pressure reading without diagnosis of hypertension; Hyperlipidemia; DDD (degenerative disc disease), cervical; DDD (degenerative disc disease), lumbar; HLA B27 (HLA B27 positive); History of IBS; Obesity (BMI 30.0-34.9); Insomnia; B12 deficiency; History of tobacco use; Dry mouth not due to sicca syndrome; Non-restorative sleep; and Anxiety on their problem list.   Married, Son had baby Oct 2020 and had back surgery, she was helping over there but he is doing better. She works as Sales promotion account executive, enjoys her job despite occasional stress.   She has some intermittent anxiety r/o stress, husband is bipolar. She prefers to manage by lifestyle, never took buspar but has if needed, declines other options at this time.   She was referred to Dr. Brett Fairy due to severe fatigue, RLS; per patient she was advised sleep study was planned but cancelled due to personal reasons. Reports was advised low suspicion of OSA, doesn't plan to pursue sleep sleep study. She reports fatigue has improved, does fine while working and focusing, never falls asleep while driving or in conversation.   She has reported joint stiffness, back pain, had + HLAB27, otherwise unremarkable autoimmune workup, saw Dr. Estanislado Pandy in 10/2016 who felt no evidence of synovitis. She has lumbar/cervical DDD, does see Dr. Ninfa Linden PRN.  She had synvisc shot in her right knee, has meniscus tear, postponing surgery.   BMI is Body mass index is 31.25 kg/m., she is working on diet, admits minimal exercise, but does walk up and down stairs at work, does get 10000 steps in a day. She only drinks water.  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   Phentermine and topamax is working well for her.  Wt Readings from Last 3 Encounters:  11/04/19 179 lb 3.2 oz (81.3 kg)  05/05/19 190 lb (86.2 kg)  11/12/18 182 lb 8 oz (82.8 kg)   Her blood pressure has been controlled at home, today their BP is BP: 124/80  She does not workout. She denies chest pain, shortness of breath, dizziness.   She is not on cholesterol medication and denies myalgias. Her cholesterol is at goal. The cholesterol last visit was:   Lab Results  Component Value Date   CHOL 199 05/05/2019   HDL 63 05/05/2019   LDLCALC 115 (H) 05/05/2019   TRIG 103 05/05/2019   CHOLHDL 3.2 05/05/2019    Last A1C in the office was:  Lab Results  Component Value Date   HGBA1C 5.3 11/03/2018   Last GFR: Lab Results  Component Value Date   GFRNONAA 101 05/05/2019   Patient is on Vitamin D supplement, taking 5000 IU Lab Results  Component Value Date   VD25OH 78 11/03/2018     She is on B12 supplement;  Lab Results  Component Value Date   [id number] 11/03/2018     Current Medications:  Current Outpatient Medications on File Prior to Visit  Medication Sig Dispense Refill  . acyclovir (ZOVIRAX) 800 MG tablet Take 1 tablet (800 mg total) by mouth daily as needed. 90 tablet 3  . clobetasol ointment (TEMOVATE) 5.82 % Apply 1 application topically 2 (two) times daily as needed.    . Cyanocobalamin (B-12) 500 MCG TABS Take by mouth.    Marland Kitchen HYDROcodone-Acetaminophen (NORCO PO) Take by mouth as needed.    . meclizine (ANTIVERT) 25 MG tablet Take 25 mg by mouth 3 (three) times daily as needed for dizziness.    . phentermine (ADIPEX-P) 37.5 MG tablet TAKE 1 TABLET BY MOUTH DAILY BEFORE BREAKFAST 30 tablet 2  . topiramate (TOPAMAX) 25 MG tablet Take    1 to 2 tablets      at Bedtime      for Dieting & Weight Loss 90 tablet 0  . vitamin C (ASCORBIC ACID) 500 MG tablet Take 500 mg by mouth daily.    Marland Kitchen VITAMIN D PO Take 5,000 Units by mouth.     No current facility-administered  medications on file prior to visit.   Allergies:  Allergies  Allergen Reactions  . Aspirin Other (See Comments)    GI upset  . Mobic [Meloxicam] Other (See Comments)    GI upset  . Wellbutrin [Bupropion] Other (See Comments)    headache  . Z-Pak [Azithromycin] Other (See Comments)    thrush  . Penicillins Rash   Medical History:  She has Family history of ischemic heart disease; Vitamin D deficiency; Elevated blood pressure reading without diagnosis of hypertension; Hyperlipidemia; DDD (degenerative disc disease), cervical; DDD (degenerative disc disease), lumbar; HLA B27 (HLA B27 positive); History of IBS; Obesity (BMI 30.0-34.9); Insomnia; B12 deficiency; History of tobacco use; Dry mouth not due to sicca syndrome; Non-restorative sleep; and Anxiety on their problem list.  Health Maintenance:   Immunization History  Administered Date(s) Administered  . Hepatitis B 01/16/2000  . PFIZER SARS-COV-2 Vaccination 09/14/2019, 10/05/2019  . PPD Test 02/18/2013  . Pneumococcal Polysaccharide-23 01/15/1993, 02/18/2013  . Tdap 09/16/2006, 10/24/2017   Tetanus: 2019 Pneumovax: 2015 Prevnar 75: age 38 Flu vaccine: declines Shingrix: will check with insurance Covid 19: 2/2, 2021, pfizer   Pap: [facility], goes annually MGM: 2019 gets at GYN - overdue, she states will schedule prior to end of the year DEXA: defer age 72  Colonoscopy: 11/2016 EGD:  Last eye: sees Dr. Nicki Reaper   Patient Care Team: Unk Pinto, MD as PCP - General (Internal Medicine)  Surgical History:  She has a past surgical history that includes lower back; Neck surgery (1998); Plantar fascia surgery; Cesarean section; and Cataract extraction, bilateral (Bilateral, 2020). Family History:  Herfamily history includes CAD in her maternal grandfather; Cancer in her maternal aunt; Cervical cancer (age of onset: 74) in her sister; Diabetes in her father; Heart attack in her maternal grandfather; Heart  disease in her father and maternal grandmother; Hyperlipidemia in her father and mother; Hypertension in her father and mother; Stroke in her maternal aunt. Social History:  She reports that she quit smoking about 5 years ago. Her smoking use included cigarettes. She quit after 22.00 years of use. She has never used smokeless tobacco. She reports previous alcohol use. She reports that she does not use drugs.  Review of Systems: Review of Systems  Constitutional: Positive for malaise/fatigue. Negative for chills, diaphoresis, fever and weight loss.  HENT: Negative.  Negative for hearing loss and tinnitus.   Eyes: Negative.  Negative for blurred vision and double vision.  Respiratory: Negative.  Negative for cough, sputum production, shortness of breath and wheezing.   Cardiovascular: Negative.  Negative for chest pain, palpitations, orthopnea, claudication, leg swelling and PND.  Gastrointestinal: Negative.  Negative for abdominal pain, blood in stool, constipation, diarrhea, heartburn, melena, nausea and vomiting.  Genitourinary: Negative.   Musculoskeletal: Negative.  Negative for falls, joint pain and myalgias.  Skin: Negative.  Negative for rash.  Neurological: Negative.  Negative for dizziness, tingling, sensory change, weakness and headaches.  Endo/Heme/Allergies: Negative.  Negative for polydipsia.  Psychiatric/Behavioral: Negative for depression, memory loss, substance abuse and suicidal ideas. The patient has insomnia. The patient is not nervous/anxious.   All other systems reviewed and are negative.   Physical Exam: Estimated body mass index is 31.25 kg/m as calculated from the following:   Height as of this encounter: 5' 3.5" (1.613 m).   Weight as of this encounter: 179 lb 3.2 oz (81.3 kg). BP 124/80   Pulse 76   Temp 97.7 F (36.5 C)   Ht 5' 3.5" (1.613 m)   Wt 179 lb 3.2 oz (81.3 kg)   LMP 05/15/2010   SpO2 98%   BMI 31.25 kg/m  General Appearance: Well nourished, in  no apparent distress.  Eyes: PERRLA, EOMs, conjunctiva no swelling or erythema Sinuses: No Frontal/maxillary tenderness  ENT/Mouth: Ext aud canals clear, normal light reflex with TMs without erythema, bulging. Good dentition. No erythema, swelling, or exudate on post pharynx. Tonsils not swollen or erythematous. Hearing normal.  Neck: Supple, thyroid normal. No bruits  Respiratory: Respiratory effort normal, BS equal bilaterally without rales, rhonchi, wheezing or stridor.  Cardio: RRR without murmurs, rubs or gallops. Brisk peripheral pulses without edema.  Chest: symmetric, with normal excursions and percussion.  Breasts: defer to GYN Abdomen: Soft, nontender, no guarding, rebound, hernias,  masses, or organomegaly.  Lymphatics: Non tender without lymphadenopathy.  Genitourinary: defer to GYN Musculoskeletal: Full ROM all peripheral extremities,5/5 strength, and normal gait.  Skin: Warm, dry without rashes, lesions, ecchymosis. Neuro: Cranial nerves intact, reflexes equal bilaterally. Normal muscle tone, no cerebellar symptoms. Sensation intact.  Psych: Awake and oriented X 3, normal affect, Insight and Judgment appropriate.   EKG: WNL no ST changes.  Monica [name] 5:47 PM [facility]

## 2019-11-04 ENCOUNTER — Other Ambulatory Visit: Payer: Self-pay

## 2019-11-04 ENCOUNTER — Ambulatory Visit: Payer: Managed Care, Other (non HMO) | Admitting: Adult Health

## 2019-11-04 ENCOUNTER — Encounter: Payer: Self-pay | Admitting: Adult Health

## 2019-11-04 VITALS — BP 124/80 | HR 76 | Temp 97.7°F | Ht 63.5 in | Wt 179.2 lb

## 2019-11-04 DIAGNOSIS — R030 Elevated blood-pressure reading, without diagnosis of hypertension: Secondary | ICD-10-CM

## 2019-11-04 DIAGNOSIS — Z79899 Other long term (current) drug therapy: Secondary | ICD-10-CM | POA: Diagnosis not present

## 2019-11-04 DIAGNOSIS — Z13220 Encounter for screening for lipoid disorders: Secondary | ICD-10-CM | POA: Diagnosis not present

## 2019-11-04 DIAGNOSIS — G4700 Insomnia, unspecified: Secondary | ICD-10-CM

## 2019-11-04 DIAGNOSIS — E559 Vitamin D deficiency, unspecified: Secondary | ICD-10-CM | POA: Diagnosis not present

## 2019-11-04 DIAGNOSIS — Z131 Encounter for screening for diabetes mellitus: Secondary | ICD-10-CM

## 2019-11-04 DIAGNOSIS — Z8719 Personal history of other diseases of the digestive system: Secondary | ICD-10-CM

## 2019-11-04 DIAGNOSIS — Z8249 Family history of ischemic heart disease and other diseases of the circulatory system: Secondary | ICD-10-CM | POA: Diagnosis not present

## 2019-11-04 DIAGNOSIS — M5030 Other cervical disc degeneration, unspecified cervical region: Secondary | ICD-10-CM

## 2019-11-04 DIAGNOSIS — Z87891 Personal history of nicotine dependence: Secondary | ICD-10-CM

## 2019-11-04 DIAGNOSIS — R682 Dry mouth, unspecified: Secondary | ICD-10-CM

## 2019-11-04 DIAGNOSIS — E785 Hyperlipidemia, unspecified: Secondary | ICD-10-CM

## 2019-11-04 DIAGNOSIS — Z0000 Encounter for general adult medical examination without abnormal findings: Secondary | ICD-10-CM

## 2019-11-04 DIAGNOSIS — Z1389 Encounter for screening for other disorder: Secondary | ICD-10-CM | POA: Diagnosis not present

## 2019-11-04 DIAGNOSIS — F419 Anxiety disorder, unspecified: Secondary | ICD-10-CM

## 2019-11-04 DIAGNOSIS — Z136 Encounter for screening for cardiovascular disorders: Secondary | ICD-10-CM | POA: Diagnosis not present

## 2019-11-04 DIAGNOSIS — Z1329 Encounter for screening for other suspected endocrine disorder: Secondary | ICD-10-CM

## 2019-11-04 DIAGNOSIS — E669 Obesity, unspecified: Secondary | ICD-10-CM

## 2019-11-04 DIAGNOSIS — E538 Deficiency of other specified B group vitamins: Secondary | ICD-10-CM

## 2019-11-04 DIAGNOSIS — M5136 Other intervertebral disc degeneration, lumbar region: Secondary | ICD-10-CM

## 2019-11-04 DIAGNOSIS — G2581 Restless legs syndrome: Secondary | ICD-10-CM

## 2019-11-04 DIAGNOSIS — Z1589 Genetic susceptibility to other disease: Secondary | ICD-10-CM

## 2019-11-04 NOTE — Patient Instructions (Addendum)
Monica [name] , Thank you for taking time to come for your Annual Wellness Visit. I appreciate your ongoing commitment to your health goals. Please review the following plan we discussed and let me know if I can assist you in the future.   These are the goals we discussed: Goals     DIET - EAT MORE FRUITS AND VEGETABLES     Aim for 7+ 1/2 cup servings of fruits and veggies daily      Weight (lb) < 150 lb (68 kg)     Weigh once a week and keep a log - goal to lose 0.5-2 lb/week       This is a list of the screening recommended for you and due dates:  Health Maintenance  Topic Date Due    Hepatitis C: One time screening is recommended by Center for Disease Control  (CDC) for  adults born from [birth year] through 1966.   Never done   HIV Screening  Never done   Mammogram  11/14/2016   Pap Smear  11/04/2018   Colon Cancer Screening  12/15/2026   Tetanus Vaccine  10/25/2027   COVID-19 Vaccine  Completed   Flu Shot  Discontinued     Reach out to insurance - ask about shingrix - can get at [facility]      Know what a healthy weight is for you (roughly BMI <25) and aim to maintain this  Aim for 7+ servings of fruits and vegetables daily  65-80+ fluid ounces of water or unsweet tea for healthy kidneys  Limit to max 1 drink of alcohol per day; avoid smoking/tobacco  Limit animal fats in diet for cholesterol and heart health - choose grass fed whenever available  Avoid highly processed foods, and foods high in saturated/trans fats  Aim for low stress - take time to unwind and care for your mental health  Aim for 150 min of moderate intensity exercise weekly for heart health, and weights twice weekly for bone health  Aim for 7-9 hours of sleep daily     Zoster Vaccine, Recombinant injection What is this medicine? ZOSTER VACCINE (ZOS ter vak SEEN) is used to prevent shingles in adults 60 years old and over. This vaccine is not used to treat shingles or nerve pain from  shingles. This medicine may be used for other purposes; ask your health care provider or pharmacist if you have questions. COMMON BRAND NAME(S): [facility] What should I tell my health care provider before I take this medicine? They need to know if you have any of these conditions:  blood disorders or disease  cancer like leukemia or lymphoma  immune system problems or therapy  an unusual or allergic reaction to vaccines, other medications, foods, dyes, or preservatives  pregnant or trying to get pregnant  breast-feeding How should I use this medicine? This vaccine is for injection in a muscle. It is given by a health care professional. Talk to your pediatrician regarding the use of this medicine in children. This medicine is not approved for use in children. Overdosage: If you think you have taken too much of this medicine contact a poison control center or emergency room at once. NOTE: This medicine is only for you. Do not share this medicine with others. What if I miss a dose? Keep appointments for follow-up (booster) doses as directed. It is important not to miss your dose. Call your doctor or health care professional if you are unable to keep an appointment. What may interact  with this medicine?  medicines that suppress your immune system  medicines to treat cancer  steroid medicines like prednisone or cortisone This list may not describe all possible interactions. Give your health care provider a list of all the medicines, herbs, non-prescription drugs, or dietary supplements you use. Also tell them if you smoke, drink alcohol, or use illegal drugs. Some items may interact with your medicine. What should I watch for while using this medicine? Visit your doctor for regular check ups. This vaccine, like all vaccines, may not fully protect everyone. What side effects may I notice from receiving this medicine? Side effects that you should report to your doctor or health care  professional as soon as possible:  allergic reactions like skin rash, itching or hives, swelling of the face, lips, or tongue  breathing problems Side effects that usually do not require medical attention (report these to your doctor or health care professional if they continue or are bothersome):  chills  headache  fever  nausea, vomiting  redness, warmth, pain, swelling or itching at site where injected  tiredness This list may not describe all possible side effects. Call your doctor for medical advice about side effects. You may report side effects to FDA at 1-800-FDA-1088. Where should I keep my medicine? This vaccine is only given in a clinic, pharmacy, doctor's office, or other health care setting and will not be stored at home. NOTE: This sheet is a summary. It may not cover all possible information. If you have questions about this medicine, talk to your doctor, pharmacist, or health care provider.  2020 Elsevier/Gold Standard (2016-08-13 13:20:30)

## 2019-11-05 ENCOUNTER — Encounter: Payer: Self-pay | Admitting: Adult Health

## 2019-11-05 DIAGNOSIS — Z862 Personal history of diseases of the blood and blood-forming organs and certain disorders involving the immune mechanism: Secondary | ICD-10-CM | POA: Insufficient documentation

## 2019-11-05 LAB — MAGNESIUM: Magnesium: 2.2 mg/dL (ref 1.5–2.5)

## 2019-11-05 LAB — CBC WITH DIFFERENTIAL/PLATELET
Absolute Monocytes: 513 cells/uL (ref 200–950)
Basophils Absolute: 44 cells/uL (ref 0–200)
Basophils Relative: 0.5 %
Eosinophils Absolute: 174 cells/uL (ref 15–500)
Eosinophils Relative: 2 %
HCT: 39.6 % (ref 35.0–45.0)
Hemoglobin: 12.4 g/dL (ref 11.7–15.5)
Lymphs Abs: 3524 cells/uL (ref 850–3900)
MCH: 25.7 pg — ABNORMAL LOW (ref 27.0–33.0)
MCHC: 31.3 g/dL — ABNORMAL LOW (ref 32.0–36.0)
MCV: 82 fL (ref 80.0–100.0)
MPV: 10.4 fL (ref 7.5–12.5)
Monocytes Relative: 5.9 %
Neutro Abs: 4446 cells/uL (ref 1500–7800)
Neutrophils Relative %: 51.1 %
Platelets: 296 10*3/uL (ref 140–400)
RBC: 4.83 10*6/uL (ref 3.80–5.10)
RDW: 12.6 % (ref 11.0–15.0)
Total Lymphocyte: 40.5 %
WBC: 8.7 10*3/uL (ref 3.8–10.8)

## 2019-11-05 LAB — URINALYSIS, ROUTINE W REFLEX MICROSCOPIC
Bilirubin Urine: NEGATIVE
Glucose, UA: NEGATIVE
Hgb urine dipstick: NEGATIVE
Ketones, ur: NEGATIVE
Leukocytes,Ua: NEGATIVE
Nitrite: NEGATIVE
Protein, ur: NEGATIVE
Specific Gravity, Urine: 1.008 (ref 1.001–1.03)
pH: 6.5 (ref 5.0–8.0)

## 2019-11-05 LAB — TSH: TSH: 1.74 mIU/L (ref 0.40–4.50)

## 2019-11-05 LAB — VITAMIN D 25 HYDROXY (VIT D DEFICIENCY, FRACTURES): Vit D, 25-Hydroxy: 45 ng/mL (ref 30–100)

## 2019-11-05 LAB — COMPLETE METABOLIC PANEL WITH GFR
AG Ratio: 2 (calc) (ref 1.0–2.5)
ALT: 6 U/L (ref 6–29)
AST: 12 U/L (ref 10–35)
Albumin: 4.5 g/dL (ref 3.6–5.1)
Alkaline phosphatase (APISO): 97 U/L (ref 37–153)
BUN: 10 mg/dL (ref 7–25)
CO2: 26 mmol/L (ref 20–32)
Calcium: 9.2 mg/dL (ref 8.6–10.4)
Chloride: 105 mmol/L (ref 98–110)
Creat: 0.56 mg/dL (ref 0.50–1.05)
GFR, Est African American: 118 mL/min/{1.73_m2} (ref >=60)
GFR, Est Non African American: 102 mL/min/{1.73_m2} (ref >=60)
Globulin: 2.2 g/dL (calc) (ref 1.9–3.7)
Glucose, Bld: 94 mg/dL (ref 65–99)
Potassium: 4.2 mmol/L (ref 3.5–5.3)
Sodium: 137 mmol/L (ref 135–146)
Total Bilirubin: 0.5 mg/dL (ref 0.2–1.2)
Total Protein: 6.7 g/dL (ref 6.1–8.1)

## 2019-11-05 LAB — LIPID PANEL
Cholesterol: 217 mg/dL — ABNORMAL HIGH (ref <200)
HDL: 67 mg/dL (ref >=50)
LDL Cholesterol (Calc): 128 mg/dL (calc) — ABNORMAL HIGH
Non-HDL Cholesterol (Calc): 150 mg/dL (calc) — ABNORMAL HIGH (ref <130)
Total CHOL/HDL Ratio: 3.2 (calc) (ref <5.0)
Triglycerides: 111 mg/dL (ref <150)

## 2019-11-05 LAB — MICROALBUMIN / CREATININE URINE RATIO
Creatinine, Urine: 30 mg/dL (ref 20–275)
Microalb, Ur: <0.2 mg/dL

## 2019-11-05 LAB — HEMOGLOBIN A1C
Hgb A1c MFr Bld: 5.4 % of total Hgb (ref <5.7)
Mean Plasma Glucose: 108 (calc)
eAG (mmol/L): 6 (calc)

## 2019-11-09 ENCOUNTER — Other Ambulatory Visit: Payer: Self-pay | Admitting: Internal Medicine

## 2019-12-01 ENCOUNTER — Other Ambulatory Visit: Payer: Self-pay | Admitting: Internal Medicine

## 2019-12-09 ENCOUNTER — Other Ambulatory Visit: Payer: Self-pay | Admitting: Adult Health

## 2019-12-09 DIAGNOSIS — Z6831 Body mass index (BMI) 31.0-31.9, adult: Secondary | ICD-10-CM

## 2020-05-04 NOTE — Progress Notes (Incomplete)
Assessment and Plan:   Elevated blood pressure reading without diagnosis of hypertension - continue medications, DASH diet, exercise and monitor at home. Call if greater than 130/80.  -     CBC with Differential/Platelet -     COMPLETE METABOLIC PANEL WITH GFR -     TSH  Hyperlipidemia, unspecified hyperlipidemia type check lipids decrease fatty foods increase activity.  -     Lipid panel  obesity -     phentermine (ADIPEX-P) 37.5 MG tablet; Take 1 tablet (37.5 mg total) by mouth daily before breakfast. -     topiramate 25 mg tablet; take 1-2 tablets at bedtime for weight.  - increase veggies, fruits, whole grains, beans, decrease processed carbs -   Portion sizes discussed and managing stress eating  - long discussion about weight loss, diet, and exercise  Vitamin D deficiency Continue supplement  Abnormal glucose Recent A1Cs at goal Discussed diet/exercise, weight management  Defer A1C; check CMP  Medication management -     Magnesium  Anxiety She prefers to avoid medication for how; does believe she has access to counselor via work, encouraged her to reach out for counseling for anxiety/stress and also weight loss stress management techniques discussed, increase water, good sleep hygiene discussed, increase exercise, and increase veggies.    Continue diet and meds as discussed. Further disposition pending results of labs. Over 30 minutes of exam, counseling, chart review, and critical decision making was performed  Future Appointments  Date Time Provider [place]  05/05/2020  8:45 AM Liane Comber, NP [facility] None  11/03/2020  9:00 AM Liane Comber, NP [facility] None     HPI 60 y.o. female  presents for 6 month follow up on hypertension, cholesterol, prediabetes, and vitamin D deficiency.   She has some intermittent anxiety r/o stress, husband is bipolar.  She prefers to manage by lifestyle, never took buspar but has if needed, declines other  options at this time.   She was referred to Dr. Brett Fairy due to severe fatigue, RLS; per patient she was advised sleep study was planned but cancelled due to personal reasons. Reports was advised low suspicion of OSA, doesn't plan to pursue sleep sleep study. She reports fatigue has improved, does fine while working and focusing, never falls asleep while driving or in conversation.   She has reported joint stiffness, back pain, had + HLAB27, otherwise unremarkable autoimmune workup, saw Dr. Estanislado Pandy in 10/2016 who felt no evidence of synovitis. She has lumbar/cervical DDD, does see Dr. Ninfa Linden PRN. She had synvisc shot in her right knee, has meniscus tear, postponing surgery.   BMI is There is no height or weight on file to calculate BMI., she is working on diet, admits minimal exercise, but does walk up and down stairs at work, does get 10000 steps in a day. She only drinks water.  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  Phentermine and topamax is working well for her.  Wt Readings from Last 3 Encounters:  11/04/19 179 lb 3.2 oz (81.3 kg)  05/05/19 190 lb (86.2 kg)  11/12/18 182 lb 8 oz (82.8 kg)   Her blood pressure has been controlled at home, today their BP is    She does not workout, working at grocery store on the weekend, lots of walking. She denies chest pain, shortness of breath, dizziness.   She is not on cholesterol medication and denies myalgias. Her cholesterol is not  at goal of LDL <100. The cholesterol last visit was:   Lab Results  Component Value Date   CHOL 217 (H) 11/04/2019   HDL 67 11/04/2019   LDLCALC 128 (H) 11/04/2019   TRIG 111 11/04/2019   CHOLHDL 3.2 11/04/2019   Last A1C in the office was:  Lab Results  Component Value Date   HGBA1C 5.4 11/04/2019   Patient is on Vitamin D supplement, she is on 5000 IU a day.   Lab Results  Component Value Date   VD25OH 45 11/04/2019   She is on B12 sublingual.  Lab Results   Component Value Date   [id number] 765 11/03/2018      Current Medications:  Current Outpatient Medications on File Prior to Visit  Medication Sig Dispense Refill  . acyclovir (ZOVIRAX) 800 MG tablet Take 1 tablet (800 mg total) by mouth daily as needed. 90 tablet 3  . clobetasol ointment (TEMOVATE) 4.65 % Apply 1 application topically 2 (two) times daily as needed.    . Cyanocobalamin (B-12) 500 MCG TABS Take by mouth.    Marland Kitchen HYDROcodone-Acetaminophen (NORCO PO) Take by mouth as needed.    . meclizine (ANTIVERT) 25 MG tablet Take 25 mg by mouth 3 (three) times daily as needed for dizziness.    . phentermine (ADIPEX-P) 37.5 MG tablet Take     1 tablet      every Morning       for Dieting & Weight Loss 90 tablet 0  . topiramate (TOPAMAX) 50 MG tablet Take      1/2 to 1 tablet      2 x /day      at Suppertime & Bedtime        for Dieting & Weight Loss 180 tablet 0  . vitamin C (ASCORBIC ACID) 500 MG tablet Take 500 mg by mouth daily.    Marland Kitchen VITAMIN D PO Take 5,000 Units by mouth.     No current facility-administered medications on file prior to visit.   Medical History:  Past Medical History:  Diagnosis Date  . Anal fissure   . DDD (degenerative disc disease), cervical   . DDD (degenerative disc disease), lumbar   . Hyperlipidemia   . Hypertension    Patient denies.  . IBS (irritable bowel syndrome)   . Lower back pain   . Neck pain   . Plantar fasciitis of left foot   . Plantar fasciitis of right foot   . Unspecified vitamin D deficiency    Allergies:  Allergies  Allergen Reactions  . Aspirin Other (See Comments)    GI upset  . Mobic [Meloxicam] Other (See Comments)    GI upset  . Wellbutrin [Bupropion] Other (See Comments)    headache  . Z-Pak [Azithromycin] Other (See Comments)    thrush  . Penicillins Rash    *** Review of Systems:  Review of Systems  Constitutional: Negative for chills, diaphoresis, fever, malaise/fatigue and weight loss.  HENT: Negative.   Negative for hearing loss and tinnitus.   Eyes: Negative.  Negative for blurred vision and double vision.  Respiratory: Negative.  Negative for cough, shortness of breath and wheezing.   Cardiovascular: Negative.  Negative for chest pain, palpitations, orthopnea, claudication and leg swelling.  Gastrointestinal: Negative.  Negative for abdominal pain, blood in stool, constipation, diarrhea, heartburn, melena, nausea and vomiting.  Genitourinary: Negative.   Musculoskeletal: Negative.  Negative for joint pain and myalgias.  Skin: Negative.  Negative for rash.  Neurological: Negative.  Negative for dizziness, tingling, sensory change, weakness and headaches.  Endo/Heme/Allergies: Negative.  Negative for polydipsia.  Psychiatric/Behavioral: Negative for depression ( ) and suicidal ideas. The patient is nervous/anxious (managing with meds PRN). The patient does not have insomnia.   All other systems reviewed and are negative.   Family history- Review and unchanged Social history- Review and unchanged Physical Exam: LMP 05/15/2010  Wt Readings from Last 3 Encounters:  11/04/19 179 lb 3.2 oz (81.3 kg)  05/05/19 190 lb (86.2 kg)  11/12/18 182 lb 8 oz (82.8 kg)   General Appearance: Well nourished, in no apparent distress. Eyes: PERRLA, EOMs, conjunctiva no swelling or erythema Sinuses: No Frontal/maxillary tenderness ENT/Mouth: Ext aud canals clear, TMs without erythema, bulging. No erythema, swelling, or exudate on post pharynx.  Tonsils not swollen or erythematous. Hearing normal.  Neck: Supple, thyroid normal.  Respiratory: Respiratory effort normal, BS equal bilaterally without rales, rhonchi, wheezing or stridor.  Cardio: RRR with no MRGs. Brisk peripheral pulses without edema.  Abdomen: Soft, + BS,  Non tender, no guarding, rebound, hernias, masses. Lymphatics: Non tender without lymphadenopathy.  Musculoskeletal: Full ROM, 5/5 strength, Normal gait Skin: Warm, dry without rashes,  lesions, ecchymosis.  Neuro: Cranial nerves intact. Normal muscle tone, no cerebellar symptoms. Psych: Awake and oriented X 3, normal affect, Insight and Judgment appropriate.    Izora Ribas, NP 8:25 AM [facility]

## 2020-05-05 ENCOUNTER — Ambulatory Visit: Payer: Managed Care, Other (non HMO) | Admitting: Adult Health

## 2020-06-14 ENCOUNTER — Ambulatory Visit: Payer: Managed Care, Other (non HMO) | Admitting: Adult Health

## 2020-07-12 ENCOUNTER — Ambulatory Visit: Payer: Managed Care, Other (non HMO) | Admitting: Adult Health

## 2020-11-02 NOTE — Progress Notes (Signed)
Complete Physical  Assessment and Plan:  Encounter for Annual Physical Exam with abnormal findings Due annually  Health Maintenance reviewed Healthy lifestyle reviewed and goals set She declined flu vaccine  Elevated blood pressure reading without diagnosis of hypertension - mild elevations; continue DASH diet, exercise and monitor at home. Call if greater than 130/80.  -     CBC with Differential/Platelet -     COMPLETE METABOLIC PANEL WITH GFR -     TSH -     Urinalysis, Routine w reflex microscopic -     Microalbumin / creatinine urine ratio -     EKG 12-Lead  Hyperlipidemia, unspecified hyperlipidemia type LDL goal <100, recommend statin if 130+ check lipids decrease fatty foods increase activity.  -     Lipid panel -     EKG 12-Lead  B12 deficiency -     Vitamin B12 - defer, restart supplement   Vitamin D deficiency -     VITAMIN D 25 Hydroxy (Vit-D Deficiency, Fractures) - defer, restart supplement   HLA B27 (HLA B27 positive) Monitor, otherwise negative workup, sicca  History of IBS Diet controlled  History of tobacco use Commended patient for quitting and reviewed strategies for preventing relapses  DDD (degenerative disc disease), cervical Monitor  Obesity - BMI 33 - Declines meds - increase veggies, decrease carbs - long discussion about weight loss, diet, and exercise - follow up 6 months for progress monitoring  Medication management -     Magnesium  Abnormal glucose -     Hemoglobin A1c - skip this year -Discussed disease progression and risks Discussed diet/exercise, weight management and risk modification  Insomnia - good sleep hygiene discussed, increase day time activity, try melatonin or benadryl, ear plugs, may need to sleep in another room if snoring remains disruptive.   Scalp psoriasis - clobetasol 0.05% solution BID PRN  Orders Placed This Encounter  Procedures   CBC with Differential/Platelet   COMPLETE METABOLIC PANEL WITH  GFR   Magnesium   Lipid panel   TSH   Microalbumin / creatinine urine ratio   Urinalysis, Routine w reflex microscopic   Hepatitis C antibody   EKG 12-Lead    Discussed med's effects and SE's. Screening labs and tests as requested with regular follow-up as recommended. Over 40 minutes of exam, counseling, chart review, and complex, high level critical decision making was performed this visit.   Future Appointments  Date Time Provider [place]  05/04/2021  9:30 AM Liane Comber, NP [facility] None  11/03/2021  9:00 AM Liane Comber, NP [facility] None    HPI  60 y.o. female  presents for a complete physical and follow up for has Family history of ischemic heart disease; Vitamin D deficiency; Elevated blood pressure reading without diagnosis of hypertension; Hyperlipidemia; DDD (degenerative disc disease), cervical; DDD (degenerative disc disease), lumbar; HLA B27 (HLA B27 positive); History of IBS; Obesity (BMI 30.0-34.9); Insomnia; B12 deficiency; History of tobacco use; Anxiety; History of iron deficiency anemia; and Scalp psoriasis on their problem list.   Married, she has a son, 1 grandchildren. She works as Sales promotion account executive, enjoys her job despite occasional stress. Follows GYN Dr. Willis Modena annually. No concerns today.   She has left knee torn meniscus, opted not to have surgery, monitoring with conservative therapy, takes tylenol, topical NSAIDs and ice. Dr. Ninfa Linden.   She has some intermittent anxiety r/t stress, husband is bipolar. She prefers to manage by lifestyle.  She was referred to Dr. Brett Fairy due to fatigue,  had negative sleep study, insomnia, managing with sleep hygiene/lifestyle. Husband snores.   She has reported joint stiffness, back pain, had + HLAB27, otherwise unremarkable autoimmune workup, saw Dr. Estanislado Pandy in 10/2016 who felt no evidence of synovitis. She has lumbar/cervical DDD. Also follows Dr. Ninfa Linden PRN. She had synvisc shot in her right knee,  has meniscus tear, opted not to have surgery and managing well. Occasional tylenol.   BMI is Body mass index is 33.44 kg/m., she is working on diet, admits minimal intentional exercise, but does walk up and down stairs at work, does get 10000 steps in a day. She stopped phentermine/topamax as felt appetite was worse if she missed a dose and prefers to manage with lifestyle. Admits stress at work and snacking while at work and restarted soda, receptive to cut back.  Wt Readings from Last 3 Encounters:  11/03/20 194 lb 12.8 oz (88.4 kg)  11/04/19 179 lb 3.2 oz (81.3 kg)  05/05/19 190 lb (86.2 kg)   Her blood pressure has been controlled at home, today their BP is BP: 128/80  She does not workout. She denies chest pain, shortness of breath, dizziness.   She is not on cholesterol medication and denies myalgias. Her cholesterol is not at goal. The cholesterol last visit was:   Lab Results  Component Value Date   CHOL 217 (H) 11/04/2019   HDL 67 11/04/2019   LDLCALC 128 (H) 11/04/2019   TRIG 111 11/04/2019   CHOLHDL 3.2 11/04/2019    Last A1C in the office was:  Lab Results  Component Value Date   HGBA1C 5.4 11/04/2019   Last GFR: Lab Results  Component Value Date   GFRNONAA 102 11/04/2019   Patient is on Vitamin D supplement, was taking 5000 IU but admits stopped Lab Results  Component Value Date   VD25OH 45 11/04/2019     She is on B12 supplement;  Lab Results  Component Value Date   [id number] 923 11/03/2018     Current Medications:  Current Outpatient Medications on File Prior to Visit  Medication Sig Dispense Refill   acyclovir (ZOVIRAX) 800 MG tablet Take 1 tablet (800 mg total) by mouth daily as needed. 90 tablet 3   Cyanocobalamin (B-12) 500 MCG TABS Take by mouth.     HYDROcodone-Acetaminophen (NORCO PO) Take by mouth as needed.     meclizine (ANTIVERT) 25 MG tablet Take 25 mg by mouth 3 (three) times daily as needed for dizziness.     vitamin C (ASCORBIC ACID)  500 MG tablet Take 500 mg by mouth daily.     VITAMIN D PO Take 5,000 Units by mouth.     No current facility-administered medications on file prior to visit.   Allergies:  Allergies  Allergen Reactions   Aspirin Other (See Comments)    GI upset   Mobic [Meloxicam] Other (See Comments)    GI upset   Wellbutrin [Bupropion] Other (See Comments)    headache   Z-Pak [Azithromycin] Other (See Comments)    thrush   Penicillins Rash   Medical History:  She has Family history of ischemic heart disease; Vitamin D deficiency; Elevated blood pressure reading without diagnosis of hypertension; Hyperlipidemia; DDD (degenerative disc disease), cervical; DDD (degenerative disc disease), lumbar; HLA B27 (HLA B27 positive); History of IBS; Obesity (BMI 30.0-34.9); Insomnia; B12 deficiency; History of tobacco use; Anxiety; History of iron deficiency anemia; and Scalp psoriasis on their problem list.   Health Maintenance:   Immunization History  Administered Date(s) Administered  Hepatitis B 01/16/2000   PFIZER(Purple Top)SARS-COV-2 Vaccination 09/14/2019, 10/05/2019   PPD Test 02/18/2013   Pneumococcal Polysaccharide-23 01/15/1993, 02/18/2013   Tdap 09/16/2006, 10/24/2017   Tetanus: 2019 Pneumovax: 2015 Prevnar 13: age 62 Flu vaccine: declines Shingrix: consider  Covid 19: 2/2, 2021, pfizer   Pap: [facility], goes annually MGM:  gets at GYN - has scheduled 11/22/2020 DEXA: defer age 49  Colonoscopy: 11/2016 10 year recall EGD:  Last eye: sees Dr. Nicki Reaper  Last dentist: Dr. Marland KitchenCriselda Peaches, last 2022  Patient Care Team: Unk Pinto, MD as PCP - General (Internal Medicine)  Surgical History:  She has a past surgical history that includes lower back; Neck surgery (1998); Plantar fascia surgery; Cesarean section; and Cataract extraction, bilateral (Bilateral, 2020). Family History:  Herfamily history includes CAD in her father and maternal grandfather; Cancer in her maternal aunt;  Cervical cancer (age of onset: 87) in her sister; Diabetes in her father; Heart attack in her maternal grandfather; Heart disease in her father and maternal grandmother; Hyperlipidemia in her father and mother; Hypertension in her father and mother; Stroke in her maternal aunt. Social History:  She reports that she quit smoking about 6 years ago. Her smoking use included cigarettes. She has never used smokeless tobacco. She reports that she does not currently use alcohol. She reports that she does not use drugs.  Review of Systems: Review of Systems  Constitutional:  Negative for chills, diaphoresis, fever, malaise/fatigue and weight loss.  HENT: Negative.  Negative for hearing loss and tinnitus.   Eyes: Negative.  Negative for blurred vision and double vision.  Respiratory: Negative.  Negative for cough, sputum production, shortness of breath and wheezing.   Cardiovascular: Negative.  Negative for chest pain, palpitations, orthopnea, claudication, leg swelling and PND.  Gastrointestinal: Negative.  Negative for abdominal pain, blood in stool, constipation, diarrhea, heartburn, melena, nausea and vomiting.  Genitourinary: Negative.   Musculoskeletal: Negative.  Negative for falls, joint pain and myalgias.  Skin: Negative.  Negative for rash.  Neurological: Negative.  Negative for dizziness, tingling, sensory change, weakness and headaches.  Endo/Heme/Allergies: Negative.  Negative for polydipsia.  Psychiatric/Behavioral:  Negative for depression, memory loss, substance abuse and suicidal ideas. The patient has insomnia. The patient is not nervous/anxious.   All other systems reviewed and are negative.  Physical Exam: Estimated body mass index is 33.44 kg/m as calculated from the following:   Height as of this encounter: 5' 4"  (1.626 m).   Weight as of this encounter: 194 lb 12.8 oz (88.4 kg). BP 128/80   Pulse 74   Temp 97.9 F (36.6 C)   Ht 5' 4"  (1.626 m)   Wt 194 lb 12.8 oz (88.4 kg)    LMP 05/15/2010   SpO2 99%   BMI 33.44 kg/m  General Appearance: Well nourished, in no apparent distress.  Eyes: PERRLA, EOMs, conjunctiva no swelling or erythema Sinuses: No Frontal/maxillary tenderness  ENT/Mouth: Ext aud canals clear, normal light reflex with TMs without erythema, bulging. Good dentition. No erythema, swelling, or exudate on post pharynx. Tonsils not swollen or erythematous. Hearing normal.  Neck: Supple, thyroid normal. No bruits  Respiratory: Respiratory effort normal, BS equal bilaterally without rales, rhonchi, wheezing or stridor.  Cardio: RRR without murmurs, rubs or gallops. Brisk peripheral pulses without edema.  Chest: symmetric, with normal excursions and percussion.  Breasts: defer to GYN, denies concerns Abdomen: Soft, nontender, no guarding, rebound, hernias, masses, or organomegaly.  Lymphatics: Non tender without lymphadenopathy.  Genitourinary: defer to  GYN Musculoskeletal: Full ROM all peripheral extremities,5/5 strength, and normal gait.  Skin: Warm, dry without lesions, ecchymosis. Scaly plaque rash to R lateral hairline behind ear.  Neuro: Cranial nerves intact, reflexes equal bilaterally. Normal muscle tone, no cerebellar symptoms. Sensation intact.  Psych: Awake and oriented X 3, normal affect, Insight and Judgment appropriate.   EKG: NSR no ST changes.  Gorden Harms Bonita Brindisi 1:00 PM [facility]

## 2020-11-03 ENCOUNTER — Ambulatory Visit (INDEPENDENT_AMBULATORY_CARE_PROVIDER_SITE_OTHER): Payer: Managed Care, Other (non HMO) | Admitting: Adult Health

## 2020-11-03 ENCOUNTER — Other Ambulatory Visit: Payer: Self-pay

## 2020-11-03 ENCOUNTER — Encounter: Payer: Self-pay | Admitting: Adult Health

## 2020-11-03 VITALS — BP 128/80 | HR 74 | Temp 97.9°F | Ht 64.0 in | Wt 194.8 lb

## 2020-11-03 DIAGNOSIS — Z0000 Encounter for general adult medical examination without abnormal findings: Secondary | ICD-10-CM | POA: Diagnosis not present

## 2020-11-03 DIAGNOSIS — Z8249 Family history of ischemic heart disease and other diseases of the circulatory system: Secondary | ICD-10-CM | POA: Diagnosis not present

## 2020-11-03 DIAGNOSIS — R682 Dry mouth, unspecified: Secondary | ICD-10-CM

## 2020-11-03 DIAGNOSIS — E669 Obesity, unspecified: Secondary | ICD-10-CM

## 2020-11-03 DIAGNOSIS — Z1589 Genetic susceptibility to other disease: Secondary | ICD-10-CM

## 2020-11-03 DIAGNOSIS — L409 Psoriasis, unspecified: Secondary | ICD-10-CM

## 2020-11-03 DIAGNOSIS — Z1159 Encounter for screening for other viral diseases: Secondary | ICD-10-CM

## 2020-11-03 DIAGNOSIS — Z131 Encounter for screening for diabetes mellitus: Secondary | ICD-10-CM

## 2020-11-03 DIAGNOSIS — G4700 Insomnia, unspecified: Secondary | ICD-10-CM

## 2020-11-03 DIAGNOSIS — Z79899 Other long term (current) drug therapy: Secondary | ICD-10-CM

## 2020-11-03 DIAGNOSIS — Z862 Personal history of diseases of the blood and blood-forming organs and certain disorders involving the immune mechanism: Secondary | ICD-10-CM

## 2020-11-03 DIAGNOSIS — E559 Vitamin D deficiency, unspecified: Secondary | ICD-10-CM

## 2020-11-03 DIAGNOSIS — M51369 Other intervertebral disc degeneration, lumbar region without mention of lumbar back pain or lower extremity pain: Secondary | ICD-10-CM

## 2020-11-03 DIAGNOSIS — Z87891 Personal history of nicotine dependence: Secondary | ICD-10-CM | POA: Diagnosis not present

## 2020-11-03 DIAGNOSIS — F419 Anxiety disorder, unspecified: Secondary | ICD-10-CM

## 2020-11-03 DIAGNOSIS — Z136 Encounter for screening for cardiovascular disorders: Secondary | ICD-10-CM | POA: Diagnosis not present

## 2020-11-03 DIAGNOSIS — E66811 Obesity, class 1: Secondary | ICD-10-CM

## 2020-11-03 DIAGNOSIS — R030 Elevated blood-pressure reading, without diagnosis of hypertension: Secondary | ICD-10-CM

## 2020-11-03 DIAGNOSIS — M5136 Other intervertebral disc degeneration, lumbar region: Secondary | ICD-10-CM

## 2020-11-03 DIAGNOSIS — E538 Deficiency of other specified B group vitamins: Secondary | ICD-10-CM

## 2020-11-03 DIAGNOSIS — Z13220 Encounter for screening for lipoid disorders: Secondary | ICD-10-CM | POA: Diagnosis not present

## 2020-11-03 DIAGNOSIS — Z0001 Encounter for general adult medical examination with abnormal findings: Secondary | ICD-10-CM

## 2020-11-03 DIAGNOSIS — G478 Other sleep disorders: Secondary | ICD-10-CM

## 2020-11-03 DIAGNOSIS — E785 Hyperlipidemia, unspecified: Secondary | ICD-10-CM

## 2020-11-03 DIAGNOSIS — Z1389 Encounter for screening for other disorder: Secondary | ICD-10-CM | POA: Diagnosis not present

## 2020-11-03 DIAGNOSIS — M5030 Other cervical disc degeneration, unspecified cervical region: Secondary | ICD-10-CM

## 2020-11-03 DIAGNOSIS — Z1329 Encounter for screening for other suspected endocrine disorder: Secondary | ICD-10-CM

## 2020-11-03 MED ORDER — CLOBETASOL PROPIONATE 0.05 % EX SOLN: 1.0000 "application " | Freq: Two times a day (BID) | CUTANEOUS | 1 refills | Status: AC | PRN

## 2020-11-03 NOTE — Patient Instructions (Signed)
Monica [name] , Thank you for taking time to come for your Annual Wellness Visit. I appreciate your ongoing commitment to your health goals. Please review the following plan we discussed and let me know if I can assist you in the future.   These are the goals we discussed:  Goals      DIET - EAT MORE FRUITS AND VEGETABLES     Aim for 7+ 1/2 cup servings of fruits and veggies daily      Weight (lb) < 150 lb (68 kg)     Weigh once a week and keep a log - goal to lose 0.5-2 lb/week        This is a list of the screening recommended for you and due dates:  Health Maintenance  Topic Date Due   Hepatitis C Screening: USPSTF Recommendation to screen - Ages 34-79 yo.  Never done   Zoster (Shingles) Vaccine (1 of 2) Never done   Mammogram  11/14/2016   Pap Smear  11/04/2018   COVID-19 Vaccine (3 - Pfizer risk series) 11/19/2020*   Pneumococcal Vaccination (3 - PCV) 11/03/2025*   Colon Cancer Screening  12/15/2026   Tetanus Vaccine  10/25/2027   HPV Vaccine  Aged Out   Flu Shot  Discontinued   HIV Screening  Discontinued  *Topic was postponed. The date shown is not the original due date.      High-Fiber Eating Plan Fiber, also called dietary fiber, is a type of carbohydrate. It is found foods such as fruits, vegetables, whole grains, and beans. A high-fiber diet can have many health benefits. Your health care provider may recommend a high-fiber diet to help: Prevent constipation. Fiber can make your bowel movements more regular. Lower your cholesterol. Relieve the following conditions: Inflammation of veins in the anus (hemorrhoids). Inflammation of specific areas of the digestive tract (uncomplicated diverticulosis). A problem of the large intestine, also called the colon, that sometimes causes pain and diarrhea (irritable bowel syndrome, or IBS). Prevent overeating as part of a weight-loss plan. Prevent heart disease, type 2 diabetes, and certain cancers. What are tips for  following this plan? Reading food labels  Check the nutrition facts label on food products for the amount of dietary fiber. Choose foods that have 5 grams of fiber or more per serving. The goals for recommended daily fiber intake include: Men (age 65 or younger): 34-38 g. Men (over age 7): 28-34 g. Women (age 47 or younger): 25-28 g. Women (over age 2): 22-25 g. Your daily fiber goal is _____________ g. Shopping Choose whole fruits and vegetables instead of processed forms, such as apple juice or applesauce. Choose a wide variety of high-fiber foods such as avocados, lentils, oats, and kidney beans. Read the nutrition facts label of the foods you choose. Be aware of foods with added fiber. These foods often have high sugar and sodium amounts per serving. Cooking Use whole-grain flour for baking and cooking. Cook with brown rice instead of white rice. Meal planning Start the day with a breakfast that is high in fiber, such as a cereal that contains 5 g of fiber or more per serving. Eat breads and cereals that are made with whole-grain flour instead of refined flour or white flour. Eat brown rice, bulgur wheat, or millet instead of white rice. Use beans in place of meat in soups, salads, and pasta dishes. Be sure that half of the grains you eat each day are whole grains. General information You can get the recommended  daily intake of dietary fiber by: Eating a variety of fruits, vegetables, grains, nuts, and beans. Taking a fiber supplement if you are not able to take in enough fiber in your diet. It is better to get fiber through food than from a supplement. Gradually increase how much fiber you consume. If you increase your intake of dietary fiber too quickly, you may have bloating, cramping, or gas. Drink plenty of water to help you digest fiber. Choose high-fiber snacks, such as berries, raw vegetables, nuts, and popcorn. What foods should I eat? Fruits Berries. Pears. Apples.  Oranges. Avocado. Prunes and raisins. Dried figs. Vegetables Sweet potatoes. Spinach. Kale. Artichokes. Cabbage. Broccoli. Cauliflower. Green peas. Carrots. Squash. Grains Whole-grain breads. Multigrain cereal. Oats and oatmeal. Brown rice. Barley. Bulgur wheat. [place]. Quinoa. Bran muffins. Popcorn. Rye wafer crackers. Meats and other proteins Navy beans, kidney beans, and pinto beans. Soybeans. Split peas. Lentils. Nuts and seeds. Dairy Fiber-fortified yogurt. Beverages Fiber-fortified soy milk. Fiber-fortified orange juice. Other foods Fiber bars. The items listed above may not be a complete list of recommended foods and beverages. Contact a dietitian for more information. What foods should I avoid? Fruits Fruit juice. Cooked, strained fruit. Vegetables Fried potatoes. Canned vegetables. Well-cooked vegetables. Grains White bread. Pasta made with refined flour. White rice. Meats and other proteins Fatty cuts of meat. Fried chicken or fried fish. Dairy Milk. Yogurt. Cream cheese. Sour cream. Fats and oils Butters. Beverages Soft drinks. Other foods Cakes and pastries. The items listed above may not be a complete list of foods and beverages to avoid. Talk with your dietitian about what choices are best for you. Summary Fiber is a type of carbohydrate. It is found in foods such as fruits, vegetables, whole grains, and beans. A high-fiber diet has many benefits. It can help to prevent constipation, lower blood cholesterol, aid weight loss, and reduce your risk of heart disease, diabetes, and certain cancers. Increase your intake of fiber gradually. Increasing fiber too quickly may cause cramping, bloating, and gas. Drink plenty of water while you increase the amount of fiber you consume. The best sources of fiber include whole fruits and vegetables, whole grains, nuts, seeds, and beans. This information is not intended to replace advice given to you by your health care provider.  Make sure you discuss any questions you have with your health care provider. Document Revised: 05/07/2019 Document Reviewed: 05/07/2019 Elsevier Patient Education  2022 [employer].

## 2020-11-04 LAB — URINALYSIS, ROUTINE W REFLEX MICROSCOPIC
Bilirubin Urine: NEGATIVE
Glucose, UA: NEGATIVE
Hgb urine dipstick: NEGATIVE
Ketones, ur: NEGATIVE
Leukocytes,Ua: NEGATIVE
Nitrite: NEGATIVE
Protein, ur: NEGATIVE
Specific Gravity, Urine: 1.022 (ref 1.001–1.035)
pH: 5.5 (ref 5.0–8.0)

## 2020-11-04 LAB — CBC WITH DIFFERENTIAL/PLATELET
Absolute Monocytes: 456 cells/uL (ref 200–950)
Basophils Absolute: 30 cells/uL (ref 0–200)
Basophils Relative: 0.4 %
Eosinophils Absolute: 106 cells/uL (ref 15–500)
Eosinophils Relative: 1.4 %
HCT: 40.6 % (ref 35.0–45.0)
Hemoglobin: 12.9 g/dL (ref 11.7–15.5)
Lymphs Abs: 2409 cells/uL (ref 850–3900)
MCH: 26.4 pg — ABNORMAL LOW (ref 27.0–33.0)
MCHC: 31.8 g/dL — ABNORMAL LOW (ref 32.0–36.0)
MCV: 83 fL (ref 80.0–100.0)
MPV: 10.5 fL (ref 7.5–12.5)
Monocytes Relative: 6 %
Neutro Abs: 4598 cells/uL (ref 1500–7800)
Neutrophils Relative %: 60.5 %
Platelets: 267 10*3/uL (ref 140–400)
RBC: 4.89 10*6/uL (ref 3.80–5.10)
RDW: 12.4 % (ref 11.0–15.0)
Total Lymphocyte: 31.7 %
WBC: 7.6 10*3/uL (ref 3.8–10.8)

## 2020-11-04 LAB — COMPLETE METABOLIC PANEL WITH GFR
AG Ratio: 2 (calc) (ref 1.0–2.5)
ALT: 6 U/L (ref 6–29)
AST: 13 U/L (ref 10–35)
Albumin: 4.3 g/dL (ref 3.6–5.1)
Alkaline phosphatase (APISO): 82 U/L (ref 37–153)
BUN: 9 mg/dL (ref 7–25)
CO2: 25 mmol/L (ref 20–32)
Calcium: 9.4 mg/dL (ref 8.6–10.4)
Chloride: 107 mmol/L (ref 98–110)
Creat: 0.56 mg/dL (ref 0.50–1.05)
Globulin: 2.2 g/dL (calc) (ref 1.9–3.7)
Glucose, Bld: 92 mg/dL (ref 65–99)
Potassium: 4.4 mmol/L (ref 3.5–5.3)
Sodium: 140 mmol/L (ref 135–146)
Total Bilirubin: 0.5 mg/dL (ref 0.2–1.2)
Total Protein: 6.5 g/dL (ref 6.1–8.1)
eGFR: 104 mL/min/{1.73_m2} (ref >=60)

## 2020-11-04 LAB — TSH: TSH: 1.4 mIU/L (ref 0.40–4.50)

## 2020-11-04 LAB — MICROALBUMIN / CREATININE URINE RATIO
Creatinine, Urine: 122 mg/dL (ref 20–275)
Microalb Creat Ratio: 5 mcg/mg creat (ref <30)
Microalb, Ur: 0.6 mg/dL

## 2020-11-04 LAB — LIPID PANEL
Cholesterol: 197 mg/dL (ref <200)
HDL: 59 mg/dL (ref >=50)
LDL Cholesterol (Calc): 113 mg/dL (calc) — ABNORMAL HIGH
Non-HDL Cholesterol (Calc): 138 mg/dL (calc) — ABNORMAL HIGH (ref <130)
Total CHOL/HDL Ratio: 3.3 (calc) (ref <5.0)
Triglycerides: 142 mg/dL (ref <150)

## 2020-11-04 LAB — MAGNESIUM: Magnesium: 2.2 mg/dL (ref 1.5–2.5)

## 2020-11-04 LAB — HEPATITIS C ANTIBODY
Hepatitis C Ab: NONREACTIVE
SIGNAL TO CUT-OFF: 0.03 (ref <1.00)

## 2020-12-15 ENCOUNTER — Other Ambulatory Visit: Payer: Self-pay | Admitting: Internal Medicine

## 2020-12-15 DIAGNOSIS — Z6831 Body mass index (BMI) 31.0-31.9, adult: Secondary | ICD-10-CM

## 2020-12-26 ENCOUNTER — Encounter: Payer: Self-pay | Admitting: Adult Health

## 2020-12-27 ENCOUNTER — Other Ambulatory Visit: Payer: Self-pay | Admitting: Adult Health

## 2020-12-27 DIAGNOSIS — Z6831 Body mass index (BMI) 31.0-31.9, adult: Secondary | ICD-10-CM

## 2020-12-27 MED ORDER — PHENTERMINE HCL 37.5 MG PO TABS: 37.5000 mg | ORAL_TABLET | Freq: Every day | ORAL | 2 refills | Status: DC

## 2020-12-28 ENCOUNTER — Encounter: Payer: Self-pay | Admitting: Internal Medicine

## 2021-03-30 ENCOUNTER — Ambulatory Visit (INDEPENDENT_AMBULATORY_CARE_PROVIDER_SITE_OTHER): Payer: Managed Care, Other (non HMO) | Admitting: Adult Health

## 2021-03-30 ENCOUNTER — Encounter: Payer: Self-pay | Admitting: Adult Health

## 2021-03-30 ENCOUNTER — Other Ambulatory Visit: Payer: Self-pay

## 2021-03-30 VITALS — BP 110/76 | HR 83 | Temp 97.5°F | Wt 190.0 lb

## 2021-03-30 DIAGNOSIS — J060 Acute laryngopharyngitis: Secondary | ICD-10-CM

## 2021-03-30 DIAGNOSIS — R21 Rash and other nonspecific skin eruption: Secondary | ICD-10-CM

## 2021-03-30 DIAGNOSIS — J029 Acute pharyngitis, unspecified: Secondary | ICD-10-CM | POA: Diagnosis not present

## 2021-03-30 LAB — POCT RAPID STREP A (OFFICE): Rapid Strep A Screen: NEGATIVE

## 2021-03-30 MED ORDER — PREDNISONE 20 MG PO TABS: ORAL_TABLET | ORAL | 0 refills | Status: DC

## 2021-03-30 MED ORDER — PROMETHAZINE-DM 6.25-15 MG/5ML PO SYRP: 5.0000 mL | ORAL_SOLUTION | Freq: Four times a day (QID) | ORAL | 1 refills | Status: DC | PRN

## 2021-03-30 MED ORDER — TRIAMCINOLONE ACETONIDE 0.5 % EX CREA: 1.0000 "application " | TOPICAL_CREAM | Freq: Two times a day (BID) | CUTANEOUS | 2 refills | Status: AC

## 2021-03-30 NOTE — Patient Instructions (Signed)

## 2021-03-30 NOTE — Progress Notes (Signed)
Assessment and Plan: ? ?Diagnoses and all orders for this visit: ? ?Acute laryngopharyngitis ?Neg rapid strep; fairly benign exam  ?Discussed the importance of avoiding unnecessary antibiotic therapy. ?Suggested symptomatic OTC remedies. ?Nasal saline spray for congestion. ?Nasal steroids, allergy pill, oral steroids offered ?Follow up as needed if not improving in 2-3 days or with any worsening sx.  ?-     predniSONE (DELTASONE) 20 MG tablet; 2 tablets daily for 3 days, 1 tablet daily for 4 days. ?-     promethazine-dextromethorphan (PROMETHAZINE-DM) 6.25-15 MG/5ML syrup; Take 5 mLs by mouth 4 (four) times daily as needed for cough. ? ?Rash ?-     triamcinolone cream (KENALOG) 0.5 %; Apply 1 application. topically 2 (two) times daily. ? ? ?Further disposition pending results of labs. Discussed med's effects and SE's.   ?Over 15 minutes of exam, counseling, chart review, and critical decision making was performed.  ? ?Future Appointments  ?Date Time Provider [place]  ?05/04/2021  9:30 AM Liane Comber, NP [facility] None  ?11/03/2021  9:00 AM Liane Comber, NP [facility] None  ? ? ?------------------------------------------------------------------------------------------------------------------ ? ? ?HPI ?BP 110/76   Pulse 83   Temp (!) 97.5 ?F (36.4 ?C)   Wt 190 lb (86.2 kg)   LMP 05/15/2010   SpO2 98%   BMI 32.61 kg/m?  ?61 y.o.female presents for evaluation of URI sx. She reports tested negative for covid 19 5 days ago.  ? ?She reports sx began 1 week ago with ST x 2 days, resolved but has had very hoarse vocal quality since then, has seen some white spots on tonsils, tenderness in neck, new nasal congestion and cough since yesterday.  ? ?She has been doing some throat numbing sprays, tea which have helped some. Main concern is hoarseness.  ? ?Allergies:  ?Allergies  ?Allergen Reactions  ? Aspirin Other (See Comments)  ?  GI upset  ? Mobic [Meloxicam] Other (See Comments)  ?  GI upset  ?  Wellbutrin [Bupropion] Other (See Comments)  ?  headache  ? Z-Pak [Azithromycin] Other (See Comments)  ?  thrush  ? Penicillins Rash  ? ?Past Medical History:  ?Diagnosis Date  ? Anal fissure   ? DDD (degenerative disc disease), cervical   ? DDD (degenerative disc disease), lumbar   ? Hyperlipidemia   ? Hypertension   ? Patient denies.  ? IBS (irritable bowel syndrome)   ? Lower back pain   ? Neck pain   ? Plantar fasciitis of left foot   ? Plantar fasciitis of right foot   ? Unspecified vitamin D deficiency   ?  ? ?Allergies  ?Allergen Reactions  ? Aspirin Other (See Comments)  ?  GI upset  ? Mobic [Meloxicam] Other (See Comments)  ?  GI upset  ? Wellbutrin [Bupropion] Other (See Comments)  ?  headache  ? Z-Pak [Azithromycin] Other (See Comments)  ?  thrush  ? Penicillins Rash  ? ? ?Current Outpatient Medications on File Prior to Visit  ?Medication Sig  ? acyclovir (ZOVIRAX) 800 MG tablet Take 1 tablet (800 mg total) by mouth daily as needed.  ? clobetasol (TEMOVATE) 0.05 % external solution Apply 1 application topically 2 (two) times daily as needed. For scalp psoriasis.  ? Cyanocobalamin (B-12) 500 MCG TABS Take by mouth.  ? HYDROcodone-Acetaminophen (NORCO PO) Take by mouth as needed.  ? meclizine (ANTIVERT) 25 MG tablet Take 25 mg by mouth 3 (three) times daily as needed for dizziness.  ? vitamin C (ASCORBIC  ACID) 500 MG tablet Take 500 mg by mouth daily.  ? VITAMIN D PO Take 5,000 Units by mouth.  ? phentermine (ADIPEX-P) 37.5 MG tablet Take 1 tablet (37.5 mg total) by mouth daily before breakfast. As needed for diet/weight loss. Take a 3-4 day drug holiday every few months to avoid tolerance. (Patient not taking: Reported on 03/30/2021)  ? ?No current facility-administered medications on file prior to visit.  ? ? ?ROS: all negative except above.  ? ?Physical Exam: ? ?BP 110/76   Pulse 83   Temp (!) 97.5 ?F (36.4 ?C)   Wt 190 lb (86.2 kg)   LMP 05/15/2010   SpO2 98%   BMI 32.61 kg/m?  ? ?General  Appearance: Well nourished, in no apparent distress. ?Eyes: PERRLA, conjunctiva no swelling or erythema ?Sinuses: No Frontal/maxillary tenderness ?ENT/Mouth: Ext aud canals clear, TMs without erythema, bulging. No erythema, swelling, or exudate on post pharynx, questionable white patch to left tonsil.  Tonsils not swollen or erythematous. Hearing normal. Hoarse vocal quality ?Neck: Supple ?Respiratory: Respiratory effort normal, BS equal bilaterally without rales, rhonchi, wheezing or stridor.  ?Cardio: RRR with no MRGs. Brisk peripheral pulses without edema.  ?Lymphatics: Non tender without lymphadenopathy.  ?Musculoskeletal: normal gait.  ?Skin: Warm, dry; she has macular rash to left thigh with some scaling approx 5 cm x 8 cm area.  ?Neuro: Cranial nerves intact. Normal muscle tone, no cerebellar symptoms. Sensation intact.  ?Psych: Awake and oriented X 3, normal affect, Insight and Judgment appropriate.  ?  ? ?Izora Ribas, NP ?2:08 PM ?[facility] ? ?

## 2021-04-04 ENCOUNTER — Telehealth: Payer: Self-pay | Admitting: Adult Health

## 2021-04-04 NOTE — Telephone Encounter (Signed)
She was seen last week for sore throat, she said she doesn't feel that much better since taking the medication that was prescribed to her. More head congestion and still the sore throat, wanting to know if something else could be sent in for her or if there was anything she could do differently. ?

## 2021-04-05 ENCOUNTER — Other Ambulatory Visit: Payer: Self-pay | Admitting: Adult Health

## 2021-04-05 ENCOUNTER — Telehealth: Payer: Self-pay | Admitting: Adult Health

## 2021-04-05 ENCOUNTER — Encounter: Payer: Self-pay | Admitting: Adult Health

## 2021-04-05 MED ORDER — DOXYCYCLINE HYCLATE 100 MG PO CAPS: ORAL_CAPSULE | ORAL | 0 refills | Status: DC

## 2021-04-05 NOTE — Telephone Encounter (Signed)
Patient saw you for URI symptoms and was told to call if she didn't improve. She isn't feeling any better. Please advise. -e welch ?

## 2021-05-04 ENCOUNTER — Ambulatory Visit: Payer: Managed Care, Other (non HMO) | Admitting: Adult Health

## 2021-06-16 ENCOUNTER — Other Ambulatory Visit: Payer: Self-pay | Admitting: Adult Health

## 2021-06-16 DIAGNOSIS — Z6831 Body mass index (BMI) 31.0-31.9, adult: Secondary | ICD-10-CM

## 2021-09-04 ENCOUNTER — Encounter: Payer: Self-pay | Admitting: Physician Assistant

## 2021-09-04 ENCOUNTER — Ambulatory Visit: Payer: Managed Care, Other (non HMO) | Admitting: Physician Assistant

## 2021-09-04 ENCOUNTER — Ambulatory Visit (INDEPENDENT_AMBULATORY_CARE_PROVIDER_SITE_OTHER): Payer: Managed Care, Other (non HMO)

## 2021-09-04 DIAGNOSIS — M25552 Pain in left hip: Secondary | ICD-10-CM | POA: Diagnosis not present

## 2021-09-04 DIAGNOSIS — M25562 Pain in left knee: Secondary | ICD-10-CM

## 2021-09-04 MED ORDER — LIDOCAINE HCL 1 % IJ SOLN
3.0000 mL | INTRAMUSCULAR | Status: AC | PRN
  Administered 2021-09-04: 3 mL

## 2021-09-04 MED ORDER — METHYLPREDNISOLONE ACETATE 40 MG/ML IJ SUSP
40.0000 mg | INTRAMUSCULAR | Status: AC | PRN
  Administered 2021-09-04: 40 mg via INTRA_ARTICULAR

## 2021-09-04 NOTE — Progress Notes (Signed)
Office Visit Note   Patient: Monica [name]           Date of Birth: Oct 24, 1960           MRN: [id number] Visit Date: 09/04/2021              Requested by: Unk Pinto, [address] [place],  [place] [address] PCP: Unk Pinto, MD   Assessment & Plan: Visit Diagnoses:  1. Pain in left hip   2. Acute pain of left knee     Plan: Have her perform quad strengthening exercises.  We discussed with her knee friendly exercises.  We will see her back in 2 weeks to see what type of benefit she had from the injection.  Questions were encouraged and answered at length.  Follow-Up Instructions: Return in about 2 weeks (around 09/18/2021).   Orders:  Orders Placed This Encounter  Procedures   Large Joint Inj: L knee   XR HIP UNILAT W OR W/O PELVIS 2-3 VIEWS LEFT   XR Knee 1-2 Views Left   No orders of the defined types were placed in this encounter.     Procedures: Large Joint Inj: L knee on 09/04/2021 9:32 AM Indications: pain Details: 22 G 1.5 in needle, superolateral approach  Arthrogram: No  Medications: 3 mL lidocaine 1 %; 40 mg methylPREDNISolone acetate 40 MG/ML Outcome: tolerated well, no immediate complications Procedure, treatment alternatives, risks and benefits explained, specific risks discussed. Consent was given by the patient. Immediately prior to procedure a time out was called to verify the correct patient, procedure, equipment, support staff and site/side marked as required. Patient was prepped and draped in the usual sterile fashion.       Clinical Data: No additional findings.   Subjective: Chief Complaint  Patient presents with   Left Hip - Pain   Left Knee - Pain    HPI Monica [name] 61 year old female well-known to Dr. Delilah Shan service however has not been seen in some time.  Comes in today with complaint of left knee pain radiating up towards the hip.  She states she is unable to tell but is having a popping sensation  either in the knee at the hip.  She has difficulty with stairs or sitting for too long.  Feels the knee gives way on her.  She notes swelling of her left knee.  No known injury.  No significant groin pain.  No numbness tingling down the leg.  She has tried Advil and Tylenol without much relief. Review of Systems Negative for fevers chills.  Objective: Vital Signs: LMP 05/15/2010   Physical Exam General: Well-developed well-nourished female no acute distress mood and affect appropriate. Psych: Alert and oriented x3. Ortho Exam Bilateral hips: Good range of motion of both hips without pain.  Nontender over the trochanteric region bilaterally. Bilateral knees: Good range of motion of both knees.  Left greater than right patellofemoral crepitus.  Tenderness left knee peripatellar region.  Slight edema but no erythema abnormal warmth or obvious effusion of the left knee.  Right knee is nontender throughout.  No instability valgus varus stressing of either knee.  Specialty Comments:  No specialty comments available.  Imaging: XR Knee 1-2 Views Left  Result Date: 09/04/2021 Left knee AP lateral views show moderate medial compartmental narrowing and mild to moderate patellofemoral changes.  Lateral compartment well-preserved.  No acute fractures.  No bony abnormalities.  Knee is well located.  XR HIP UNILAT W OR W/O PELVIS 2-3  VIEWS LEFT  Result Date: 09/04/2021 AP pelvis lateral view left hip: No acute fracture.  Both hips well located.  No bony abnormalities.    PMFS History: Patient Active Problem List   Diagnosis Date Noted   Scalp psoriasis 11/03/2020   History of iron deficiency anemia 11/05/2019   Anxiety 04/30/2019   History of tobacco use 11/12/2018   B12 deficiency 10/24/2017   Obesity (BMI 30.0-34.9) 05/20/2017   Insomnia 05/20/2017   History of IBS 10/18/2016   HLA B27 (HLA B27 positive) 10/04/2016   Vitamin D deficiency    Elevated blood pressure reading without diagnosis  of hypertension    Hyperlipidemia    DDD (degenerative disc disease), cervical    DDD (degenerative disc disease), lumbar    Family history of ischemic heart disease 11/28/2012   Past Medical History:  Diagnosis Date   Anal fissure    DDD (degenerative disc disease), cervical    DDD (degenerative disc disease), lumbar    Hyperlipidemia    Hypertension    Patient denies.   IBS (irritable bowel syndrome)    Lower back pain    Neck pain    Plantar fasciitis of left foot    Plantar fasciitis of right foot    Unspecified vitamin D deficiency     Family History  Problem Relation Age of Onset   Hyperlipidemia Mother    Hypertension Mother    Heart disease Father    Diabetes Father    Hyperlipidemia Father    Hypertension Father    CAD Father        bypass   Cervical cancer Sister 44   Heart disease Maternal Grandmother    CAD Maternal Grandfather    Heart attack Maternal Grandfather        x2   Stroke Maternal Aunt    Cancer Maternal Aunt        breast x 2 different aunts   Colon cancer Neg Hx    Esophageal cancer Neg Hx    Pancreatic cancer Neg Hx    Rectal cancer Neg Hx    Stomach cancer Neg Hx     Past Surgical History:  Procedure Laterality Date   CATARACT EXTRACTION, BILATERAL Bilateral 2020   Dr. Tama High   CESAREAN SECTION     lower back     L4 and L5, disc surgery, no hardware, had x 2   NECK SURGERY  1998   metal plate and pins in neck, Dr. Karie Chimera   PLANTAR FASCIA SURGERY     both feet   Social History   Occupational History   Occupation: Web designer  Tobacco Use   Smoking status: Former    Years: 22.00    Types: Cigarettes    Quit date: 09/16/2014    Years since quitting: 6.9   Smokeless tobacco: Never  Vaping Use   Vaping Use: Never used  Substance and Sexual Activity   Alcohol use: Not Currently    Alcohol/week: 0.0 standard drinks of alcohol    Comment: very rare   Drug use: No   Sexual activity: Not Currently     Partners: Male    Birth control/protection: Post-menopausal

## 2021-09-25 ENCOUNTER — Ambulatory Visit: Payer: Managed Care, Other (non HMO) | Admitting: Orthopaedic Surgery

## 2021-09-25 ENCOUNTER — Encounter: Payer: Self-pay | Admitting: Orthopaedic Surgery

## 2021-09-25 DIAGNOSIS — M25562 Pain in left knee: Secondary | ICD-10-CM

## 2021-09-25 NOTE — Progress Notes (Signed)
The patient is here today following up 2 weeks after having a steroid injection in her left knee.  She says that is really helped quite a bit.  She still gets popping in her knee.  She is an active 61 year old female and has been avoiding stairs.  The popping is asymptomatic though.  Her left knee has excellent range of motion.  There is significant patellofemoral crepitation.  There is no effusion.  Her knee is ligamentously stable.  I talked her about quad strengthening exercises to try.  Since she is doing so much better follow-up can be as needed.  If her knee worsens at all my next step would be considering an MRI of the knee.

## 2021-11-03 ENCOUNTER — Encounter: Payer: Managed Care, Other (non HMO) | Admitting: Nurse Practitioner

## 2021-11-17 ENCOUNTER — Ambulatory Visit (INDEPENDENT_AMBULATORY_CARE_PROVIDER_SITE_OTHER): Payer: Managed Care, Other (non HMO) | Admitting: Nurse Practitioner

## 2021-11-17 ENCOUNTER — Encounter: Payer: Self-pay | Admitting: Nurse Practitioner

## 2021-11-17 VITALS — BP 112/76 | HR 64 | Temp 97.3°F | Ht 64.0 in | Wt 188.6 lb

## 2021-11-17 DIAGNOSIS — Z1329 Encounter for screening for other suspected endocrine disorder: Secondary | ICD-10-CM

## 2021-11-17 DIAGNOSIS — Z130 Encounter for screening for diseases of the blood and blood-forming organs and certain disorders involving the immune mechanism: Secondary | ICD-10-CM

## 2021-11-17 DIAGNOSIS — Z1589 Genetic susceptibility to other disease: Secondary | ICD-10-CM

## 2021-11-17 DIAGNOSIS — Z13220 Encounter for screening for lipoid disorders: Secondary | ICD-10-CM

## 2021-11-17 DIAGNOSIS — Z131 Encounter for screening for diabetes mellitus: Secondary | ICD-10-CM | POA: Diagnosis not present

## 2021-11-17 DIAGNOSIS — R030 Elevated blood-pressure reading, without diagnosis of hypertension: Secondary | ICD-10-CM | POA: Diagnosis not present

## 2021-11-17 DIAGNOSIS — Z1389 Encounter for screening for other disorder: Secondary | ICD-10-CM | POA: Diagnosis not present

## 2021-11-17 DIAGNOSIS — R7309 Other abnormal glucose: Secondary | ICD-10-CM

## 2021-11-17 DIAGNOSIS — L821 Other seborrheic keratosis: Secondary | ICD-10-CM

## 2021-11-17 DIAGNOSIS — Z79899 Other long term (current) drug therapy: Secondary | ICD-10-CM | POA: Diagnosis not present

## 2021-11-17 DIAGNOSIS — Z8719 Personal history of other diseases of the digestive system: Secondary | ICD-10-CM

## 2021-11-17 DIAGNOSIS — G4700 Insomnia, unspecified: Secondary | ICD-10-CM

## 2021-11-17 DIAGNOSIS — Z87891 Personal history of nicotine dependence: Secondary | ICD-10-CM

## 2021-11-17 DIAGNOSIS — E785 Hyperlipidemia, unspecified: Secondary | ICD-10-CM

## 2021-11-17 DIAGNOSIS — Z0000 Encounter for general adult medical examination without abnormal findings: Secondary | ICD-10-CM | POA: Diagnosis not present

## 2021-11-17 DIAGNOSIS — Z136 Encounter for screening for cardiovascular disorders: Secondary | ICD-10-CM | POA: Diagnosis not present

## 2021-11-17 DIAGNOSIS — E538 Deficiency of other specified B group vitamins: Secondary | ICD-10-CM

## 2021-11-17 DIAGNOSIS — M1990 Unspecified osteoarthritis, unspecified site: Secondary | ICD-10-CM

## 2021-11-17 DIAGNOSIS — E559 Vitamin D deficiency, unspecified: Secondary | ICD-10-CM | POA: Diagnosis not present

## 2021-11-17 DIAGNOSIS — Z0001 Encounter for general adult medical examination with abnormal findings: Secondary | ICD-10-CM

## 2021-11-17 DIAGNOSIS — L409 Psoriasis, unspecified: Secondary | ICD-10-CM

## 2021-11-17 NOTE — Patient Instructions (Addendum)
Return to clinic in 1-3 months for cryotherapy of seborrheic keratosis of lower back.  Seborrheic Keratosis A seborrheic keratosis is a common, noncancerous (benign) skin growth. These growths are velvety, waxy, or rough spots that appear on the skin. They are often tan, brown, or black. The skin growths can be flat or raised and may be scaly. What are the causes? The cause of this condition is not known. What increases the risk? You are more likely to develop this condition if you: Have a family history of seborrheic keratosis. Are 75 years old or older. Are pregnant. Have had estrogen replacement therapy. What are the signs or symptoms? Symptoms of this condition include growths on the face, chest, shoulders, back, or other areas. These growths: Are usually painless, but may become irritated and itchy. Can be tan, yellow, brown, black, or other colors. Are slightly raised or have a flat surface. Are sometimes rough or wart-like in texture. Are often velvety or waxy on the surface. Are round or oval-shaped. Often occur in groups, but may occur as a single growth. How is this diagnosed? This condition is diagnosed with a medical history and physical exam. A sample of the growth may be tested (skin biopsy). You may also need to see a skin specialist (dermatologist). How is this treated? Treatment is not usually needed for this condition unless the growths are irritated or bleed often. You may also choose to have the growths removed if you do not like their appearance. Growth removal may include a procedure in which: Liquid nitrogen is applied to "freeze" off the growth (cryosurgery). This is the most common procedure. The growth is burned off with electricity (electrocautery). The growth is removed by scraping (curettage). Follow these instructions at home: Watch your growth or growths for any changes. Do not scratch or pick at the growth or growths. This can cause them to become  irritated or infected. Contact a health care provider if: You suddenly have many new growths. Your growth bleeds, itches, or hurts. Your growth suddenly becomes larger or changes color. Summary A seborrheic keratosis is a common, noncancerous skin growth. Treatment is not usually needed for this condition unless the growths are irritated or bleed often. Watch your growth or growths for any changes. Contact a health care provider if you suddenly have many new growths or your growth suddenly becomes larger or changes color. This information is not intended to replace advice given to you by your health care provider. Make sure you discuss any questions you have with your health care provider. Document Revised: 03/17/2021 Document Reviewed: 03/17/2021 Elsevier Patient Education  [place].

## 2021-11-17 NOTE — Progress Notes (Signed)
Complete Physical  Assessment and Plan:  Encounter for Annual Physical Exam with abnormal findings Due annually  Health Maintenance reviewed Healthy lifestyle reviewed and goals set   Elevated blood pressure reading without diagnosis of hypertension Discussed DASH (Dietary Approaches to Stop Hypertension) DASH diet is lower in sodium than a typical American diet. Cut back on foods that are high in saturated fat, cholesterol, and trans fats. Eat more whole-grain foods, fish, poultry, and nuts Remain active and exercise as tolerated daily.  Monitor BP at home-Call if greater than 130/80.  Check CMP/CBC   Hyperlipidemia, unspecified hyperlipidemia type Discussed lifestyle modifications. Recommended diet heavy in fruits and veggies, omega 3's. Decrease consumption of animal meats, cheeses, and dairy products. Remain active and exercise as tolerated. Continue to monitor. Check lipids/TSH   B12 deficiency Continue supplement Monitor levels  Vitamin D deficiency Continue supplement Monitor levels  HLA B27 (HLA B27 positive) Monitor, otherwise negative workup, sicca  History of IBS Diet controlled Discussed probiotic Limit stresss  History of tobacco use Reviewed strategies for preventing relapses  DDD (degenerative disc disease), cervical Continue to follow with Ortho Continue Norco on a limited basis. Continue to monitor  Obesity - BMI 33 Discussed appropriate BMI Goal of losing 1 lb per month. Diet modification. Physical activity. Encouraged/praised to build confidence.   Medication management All medications discussed and reviewed in full. All questions and concerns regarding medications addressed.    Abnormal glucose Education: Reviewed 'ABCs' of diabetes management  Discussed goals to be met and/or maintained include A1C (<7) Blood pressure (<130/80) Cholesterol (LDL <70) Continue Eye Exam yearly  Continue Dental Exam Q6 mo Discussed dietary  recommendations Discussed Physical Activity recommendations Check A1C   Insomnia Discussed good sleep hygiene. Establish bed and wake times. Sleep restriction-only sleep estimated hrs sleep. Bed only for sex and sleep, only sleep when sleepy, out of bed if anxious (stimulus control). Reviewed relaxation techniques, mindful meditations. Expected sleep duration. Addressed worries about not sleeping.    Scalp psoriasis Controlled - no recent flare Continue clobetasol 0.05% solution PRN Continue to monitor  Arthritis/left hip/left knee/cervical Continue to follow with Orthopedics Strengthening exercises PRN steroid injections MRI if s/s fail to improve.  Medication management All medications discussed and reviewed in full. All questions and concerns regarding medications addressed.    Screening for cardiovascular disease Monitor EKG  Screening for thyroid disease Monitor TSH  Screening for hematuria or proteinuria Monitor UA/Microalbumin  Seborrheic Keratosis Suggested annual skin check with dermatology Or RTC to have area removed via cryotherapy  Orders Placed This Encounter  Procedures   CBC with Differential/Platelet   COMPLETE METABOLIC PANEL WITH GFR   Magnesium   Lipid panel   TSH   Hemoglobin A1c   Insulin, random   VITAMIN D 25 Hydroxy (Vit-D Deficiency, Fractures)   Urinalysis, Routine w reflex microscopic   Microalbumin / creatinine urine ratio   Vitamin B12   EKG 12-Lead     Notify office for further evaluation and treatment, questions or concerns if any reported s/s fail to improve.   The patient was advised to call back or seek an in-person evaluation if any symptoms worsen or if the condition fails to improve as anticipated.   Further disposition pending results of labs. Discussed med's effects and SE's.    I discussed the assessment and treatment plan with the patient. The patient was provided an opportunity to ask questions and all were  answered. The patient agreed with the plan and demonstrated an understanding  of the instructions.  Discussed med's effects and SE's. Screening labs and tests as requested with regular follow-up as recommended.  I provided 35 minutes of face-to-face time during this encounter including counseling, chart review, and critical decision making was preformed.   Future Appointments  Date Time Provider [place]  11/17/2021  9:00 AM Darrol Jump, NP [facility] None  11/20/2022  9:00 AM Darrol Jump, NP [facility] None    HPI  61 y.o. female  presents for a complete physical and follow up for has Family history of ischemic heart disease; Vitamin D deficiency; Elevated blood pressure reading without diagnosis of hypertension; Hyperlipidemia; DDD (degenerative disc disease), cervical; DDD (degenerative disc disease), lumbar; HLA B27 (HLA B27 positive); History of IBS; Obesity (BMI 30.0-34.9); Insomnia; B12 deficiency; History of tobacco use; Anxiety; History of iron deficiency anemia; and Scalp psoriasis on their problem list.   Married, she has a son, 1 grandchild. She works as Sales promotion account executive, enjoys her job despite occasional stress.   Overall she report doing well.  She is concerned for a small area on her back that is raised and itchy.  She does not follow with dermatology.  Follows GYN Dr. Willis Modena annually. No concerns today.   She has left knee torn meniscus, opted not to have surgery, monitoring with conservative therapy, takes tylenol, topical NSAIDs and ice. She continues to used Norco on an extremely limited basis.Dr. Ninfa Linden. Saw Dr. Ninfa Linden 09/25/2021 2 weeks after having a steroid injection in the left knee.  Reports relief.  Continues to have popping in her knee--asymptomatic.  Trying to avoid stairs.  She has excellent ROM.  No significant patellofemoral crepitation.  No effusion.  She was asked to perform quad strengthening exercises.  She is set to f/u PRN.  If worsens  suggested MRI.  She also endorses left hip pain.     She has some intermittent anxiety r/t stress, husband is bipolar. She prefers to manage by lifestyle.  She was referred to Dr. Brett Fairy due to fatigue, had negative sleep study, insomnia, managing with sleep hygiene/lifestyle. Husband snores.   She has reported joint stiffness, back pain, had + HLAB27, otherwise unremarkable autoimmune workup, saw Dr. Estanislado Pandy in 10/2016 who felt no evidence of synovitis. She has lumbar/cervical DDD. Also follows Dr. Ninfa Linden PRN. She had synvisc shot in her right knee, has meniscus tear, opted not to have surgery and managing well. Occasional tylenol.   Saw [facility] 10/16/21:  Last seen 06/2021 for chornic cervical and lumbar pain.  Utilizes Norco on a limited basis.  Can manage with Tylenol most of the time.  She did note recently doing some yard work when [employer] got stuck and wehn they became dislodged hit herself in the face knocking out 4 teeth.  She had to have surgery and now has a temporary plate.  She will f/u in 6 mo  BMI is There is no height or weight on file to calculate BMI., she is working on diet, admits minimal intentional exercise, Wt Readings from Last 3 Encounters:  03/30/21 190 lb (86.2 kg)  11/03/20 194 lb 12.8 oz (88.4 kg)  11/04/19 179 lb 3.2 oz (81.3 kg)   Her blood pressure has been controlled at home, today their BP is    She does not workout. She denies chest pain, shortness of breath, dizziness.   She is not on cholesterol medication and denies myalgias. Her cholesterol is not at goal. The cholesterol last visit was:   Lab Results  Component Value Date   CHOL 197 11/03/2020   HDL 59 11/03/2020   LDLCALC 113 (H) 11/03/2020   TRIG 142 11/03/2020   CHOLHDL 3.3 11/03/2020    Last A1C in the office was:  Lab Results  Component Value Date   HGBA1C 5.4 11/04/2019   Last GFR: Lab Results  Component Value Date   GFRNONAA 102 11/04/2019   Patient is on  Vitamin D supplement, was taking 5000 IU but admits stopped Lab Results  Component Value Date   VD25OH 8 11/04/2019     She is on B12 supplement;  Lab Results  Component Value Date   VZSMOLMB86 754 11/03/2018     Current Medications:  Current Outpatient Medications on File Prior to Visit  Medication Sig Dispense Refill   acyclovir (ZOVIRAX) 800 MG tablet Take 1 tablet (800 mg total) by mouth daily as needed. 90 tablet 3   clobetasol (TEMOVATE) 0.05 % external solution Apply 1 application topically 2 (two) times daily as needed. For scalp psoriasis. 50 mL 1   Cyanocobalamin (B-12) 500 MCG TABS Take by mouth.     HYDROcodone-Acetaminophen (NORCO PO) Take by mouth as needed.     meclizine (ANTIVERT) 25 MG tablet Take 25 mg by mouth 3 (three) times daily as needed for dizziness.     triamcinolone cream (KENALOG) 0.5 % Apply 1 application. topically 2 (two) times daily. 80 g 2   No current facility-administered medications on file prior to visit.   Allergies:  Allergies  Allergen Reactions   Aspirin Other (See Comments)    GI upset   Mobic [Meloxicam] Other (See Comments)    GI upset   Wellbutrin [Bupropion] Other (See Comments)    headache   Z-Pak [Azithromycin] Other (See Comments)    thrush   Penicillins Rash   Medical History:  She has Family history of ischemic heart disease; Vitamin D deficiency; Elevated blood pressure reading without diagnosis of hypertension; Hyperlipidemia; DDD (degenerative disc disease), cervical; DDD (degenerative disc disease), lumbar; HLA B27 (HLA B27 positive); History of IBS; Obesity (BMI 30.0-34.9); Insomnia; B12 deficiency; History of tobacco use; Anxiety; History of iron deficiency anemia; and Scalp psoriasis on their problem list.   Health Maintenance:   Immunization History  Administered Date(s) Administered   Hepatitis B 01/16/2000   PFIZER(Purple Top)SARS-COV-2 Vaccination 09/14/2019, 10/05/2019   PPD Test 02/18/2013   Pneumococcal  Polysaccharide-23 01/15/1993, 02/18/2013   Tdap 09/16/2006, 10/24/2017   Tetanus: 2019 Pneumovax: 2015 Prevnar 13: age 53 Flu vaccine: declines Shingrix: consider  Covid 19: 2/2, 2021, pfizer   Pap: [facility]YN [facility], goes annually 01/2021 MGM:  gets at GYN - has scheduled 01/2021 DEXA: defer age 60  Colonoscopy: 11/2016 10 year recall due 2028 EGD:  Last eye: sees Dr. Nicki Reaper 2023 Last dentist: Dr. Criselda Peaches, last 2023 - recent bridge   Patient Care Team: Unk Pinto, MD as PCP - General (Internal Medicine)  Surgical History:  She has a past surgical history that includes lower back; Neck surgery (1998); Plantar fascia surgery; Cesarean section; and Cataract extraction, bilateral (Bilateral, 2020). Family History:  Herfamily history includes CAD in her father and maternal grandfather; Cancer in her maternal aunt; Cervical cancer (age of onset: 20) in her sister; Diabetes in her father; Heart attack in her maternal grandfather; Heart disease in her father and maternal grandmother; Hyperlipidemia in her father and mother; Hypertension in her father and mother; Stroke in her maternal aunt. Social History:  She reports that she quit smoking about  7 years ago. Her smoking use included cigarettes. She has never used smokeless tobacco. She reports that she does not currently use alcohol. She reports that she does not use drugs.  Review of Systems: Review of Systems  Constitutional:  Negative for chills, diaphoresis, fever, malaise/fatigue and weight loss.  HENT: Negative.  Negative for hearing loss and tinnitus.   Eyes: Negative.  Negative for blurred vision and double vision.  Respiratory: Negative.  Negative for cough, sputum production, shortness of breath and wheezing.   Cardiovascular: Negative.  Negative for chest pain, palpitations, orthopnea, claudication, leg swelling and PND.  Gastrointestinal: Negative.  Negative for abdominal pain, blood in stool, constipation, diarrhea,  heartburn, melena, nausea and vomiting.  Genitourinary: Negative.   Musculoskeletal: Negative.  Negative for falls, joint pain and myalgias.  Skin: Negative.  Negative for rash.  Neurological: Negative.  Negative for dizziness, tingling, sensory change, weakness and headaches.  Endo/Heme/Allergies: Negative.  Negative for polydipsia.  Psychiatric/Behavioral:  Negative for depression, memory loss, substance abuse and suicidal ideas. The patient has insomnia. The patient is not nervous/anxious.   All other systems reviewed and are negative.   Physical Exam: Estimated body mass index is 32.61 kg/m as calculated from the following:   Height as of 11/03/20: _0  (1.626 m).   Weight as of 03/30/21: 190 lb (86.2 kg). LMP 05/15/2010  General Appearance: Well nourished, in no apparent distress.  Eyes: PERRLA, EOMs, conjunctiva no swelling or erythema Sinuses: No Frontal/maxillary tenderness  ENT/Mouth: Ext aud canals clear, normal light reflex with TMs without erythema, bulging. Good dentition. No erythema, swelling, or exudate on post pharynx. Tonsils not swollen or erythematous. Hearing normal.  Neck: Supple, thyroid normal. No bruits  Respiratory: Respiratory effort normal, BS equal bilaterally without rales, rhonchi, wheezing or stridor.  Cardio: RRR without murmurs, rubs or gallops. Brisk peripheral pulses without edema.  Chest: symmetric, with normal excursions and percussion.  Breasts: defer to GYN, denies concerns Abdomen: Soft, nontender, no guarding, rebound, hernias, masses, or organomegaly.  Lymphatics: Non tender without lymphadenopathy.  Genitourinary: defer to GYN Musculoskeletal: Full ROM all peripheral extremities,5/5 strength, and normal gait.  Skin: Warm, dry without lesions, ecchymosis. Small round raised wart like skin lesion that appears "pasted on" located on lower back.  See picture below.  Neuro: Cranial nerves intact, reflexes equal bilaterally. Normal muscle tone, no  cerebellar symptoms. Sensation intact.  Psych: Awake and oriented X 3, normal affect, Insight and Judgment appropriate.   EKG: NSR SB no ST changes.    Katey Barrie 8:47 AM [facility]

## 2021-11-20 LAB — VITAMIN B12: Vitamin B-12: 296 pg/mL (ref 200–1100)

## 2021-11-20 LAB — CBC WITH DIFFERENTIAL/PLATELET
Absolute Monocytes: 482 cells/uL (ref 200–950)
Basophils Absolute: 47 cells/uL (ref 0–200)
Basophils Relative: 0.6 %
Eosinophils Absolute: 103 cells/uL (ref 15–500)
Eosinophils Relative: 1.3 %
HCT: 41.1 % (ref 35.0–45.0)
Hemoglobin: 13.2 g/dL (ref 11.7–15.5)
Lymphs Abs: 2757 cells/uL (ref 850–3900)
MCH: 26.3 pg — ABNORMAL LOW (ref 27.0–33.0)
MCHC: 32.1 g/dL (ref 32.0–36.0)
MCV: 81.9 fL (ref 80.0–100.0)
MPV: 10.6 fL (ref 7.5–12.5)
Monocytes Relative: 6.1 %
Neutro Abs: 4511 cells/uL (ref 1500–7800)
Neutrophils Relative %: 57.1 %
Platelets: 269 10*3/uL (ref 140–400)
RBC: 5.02 10*6/uL (ref 3.80–5.10)
RDW: 12.7 % (ref 11.0–15.0)
Total Lymphocyte: 34.9 %
WBC: 7.9 10*3/uL (ref 3.8–10.8)

## 2021-11-20 LAB — URINALYSIS, ROUTINE W REFLEX MICROSCOPIC
Bilirubin Urine: NEGATIVE
Glucose, UA: NEGATIVE
Hgb urine dipstick: NEGATIVE
Ketones, ur: NEGATIVE
Leukocytes,Ua: NEGATIVE
Nitrite: NEGATIVE
Protein, ur: NEGATIVE
Specific Gravity, Urine: 1.013 (ref 1.001–1.035)
pH: 5.5 (ref 5.0–8.0)

## 2021-11-20 LAB — COMPLETE METABOLIC PANEL WITH GFR
AG Ratio: 2 (calc) (ref 1.0–2.5)
ALT: 4 U/L — ABNORMAL LOW (ref 6–29)
AST: 13 U/L (ref 10–35)
Albumin: 4.5 g/dL (ref 3.6–5.1)
Alkaline phosphatase (APISO): 88 U/L (ref 37–153)
BUN: 9 mg/dL (ref 7–25)
CO2: 27 mmol/L (ref 20–32)
Calcium: 9.7 mg/dL (ref 8.6–10.4)
Chloride: 106 mmol/L (ref 98–110)
Creat: 0.63 mg/dL (ref 0.50–1.05)
Globulin: 2.3 g/dL (calc) (ref 1.9–3.7)
Glucose, Bld: 91 mg/dL (ref 65–99)
Potassium: 4.6 mmol/L (ref 3.5–5.3)
Sodium: 141 mmol/L (ref 135–146)
Total Bilirubin: 0.5 mg/dL (ref 0.2–1.2)
Total Protein: 6.8 g/dL (ref 6.1–8.1)
eGFR: 101 mL/min/{1.73_m2} (ref >=60)

## 2021-11-20 LAB — MICROALBUMIN / CREATININE URINE RATIO
Creatinine, Urine: 85 mg/dL (ref 20–275)
Microalb Creat Ratio: 4 mcg/mg creat (ref <30)
Microalb, Ur: 0.3 mg/dL

## 2021-11-20 LAB — INSULIN, RANDOM: Insulin: 5.4 u[IU]/mL

## 2021-11-20 LAB — HEMOGLOBIN A1C
Hgb A1c MFr Bld: 5.6 % of total Hgb (ref <5.7)
Mean Plasma Glucose: 114 mg/dL
eAG (mmol/L): 6.3 mmol/L

## 2021-11-20 LAB — VITAMIN D 25 HYDROXY (VIT D DEFICIENCY, FRACTURES): Vit D, 25-Hydroxy: 21 ng/mL — ABNORMAL LOW (ref 30–100)

## 2021-11-20 LAB — TSH: TSH: 1.75 mIU/L (ref 0.40–4.50)

## 2021-11-20 LAB — MAGNESIUM: Magnesium: 2.1 mg/dL (ref 1.5–2.5)

## 2021-11-20 LAB — LIPID PANEL
Cholesterol: 169 mg/dL (ref <200)
HDL: 59 mg/dL (ref >=50)
LDL Cholesterol (Calc): 93 mg/dL (calc)
Non-HDL Cholesterol (Calc): 110 mg/dL (calc) (ref <130)
Total CHOL/HDL Ratio: 2.9 (calc) (ref <5.0)
Triglycerides: 79 mg/dL (ref <150)

## 2021-11-22 ENCOUNTER — Encounter: Payer: Self-pay | Admitting: Nurse Practitioner

## 2021-12-20 ENCOUNTER — Encounter: Payer: Self-pay | Admitting: Internal Medicine

## 2021-12-28 ENCOUNTER — Other Ambulatory Visit: Payer: Self-pay | Admitting: Nurse Practitioner

## 2021-12-28 DIAGNOSIS — Z6831 Body mass index (BMI) 31.0-31.9, adult: Secondary | ICD-10-CM

## 2022-01-09 ENCOUNTER — Telehealth: Payer: Self-pay

## 2022-01-09 ENCOUNTER — Ambulatory Visit: Payer: Managed Care, Other (non HMO) | Admitting: Nurse Practitioner

## 2022-01-09 ENCOUNTER — Encounter: Payer: Self-pay | Admitting: Nurse Practitioner

## 2022-01-09 VITALS — BP 110/76 | HR 62 | Temp 97.2°F | Ht 64.0 in | Wt 185.0 lb

## 2022-01-09 DIAGNOSIS — Z79899 Other long term (current) drug therapy: Secondary | ICD-10-CM | POA: Diagnosis not present

## 2022-01-09 DIAGNOSIS — E669 Obesity, unspecified: Secondary | ICD-10-CM

## 2022-01-09 DIAGNOSIS — E785 Hyperlipidemia, unspecified: Secondary | ICD-10-CM | POA: Diagnosis not present

## 2022-01-09 MED ORDER — WEGOVY 0.25 MG/0.5ML ~~LOC~~ SOAJ: 0.2500 mg | SUBCUTANEOUS | 0 refills | Status: DC

## 2022-01-09 NOTE — Patient Instructions (Addendum)
Semaglutide Injection (Weight Management) What is this medication? SEMAGLUTIDE (SEM a GLOO tide) promotes weight loss. It may also be used to maintain weight loss. It works by decreasing appetite. Changes to diet and exercise are often combined with this medication. This medicine may be used for other purposes; ask your health care provider or pharmacist if you have questions. COMMON BRAND NAME(S): [id number] What should I tell my care team before I take this medication? They need to know if you have any of these conditions: Endocrine tumors (MEN 2) or if someone in your family had these tumors Eye disease, vision problems Gallbladder disease History of depression or mental health disease History of pancreatitis Kidney disease Stomach or intestine problems Suicidal thoughts, plans, or attempt; a previous suicide attempt by you or a family member Thyroid cancer or if someone in your family had thyroid cancer An unusual or allergic reaction to semaglutide, other medications, foods, dyes, or preservatives Pregnant or trying to get pregnant Breast-feeding How should I use this medication? This medication is injected under the skin. You will be taught how to prepare and give it. Take it as directed on the prescription label. It is given once every week (every 7 days). Keep taking it unless your care team tells you to stop. It is important that you put your used needles and pens in a special sharps container. Do not put them in a trash can. If you do not have a sharps container, call your pharmacist or care team to get one. A special MedGuide will be given to you by the pharmacist with each prescription and refill. Be sure to read this information carefully each time. This medication comes with INSTRUCTIONS FOR USE. Ask your pharmacist for directions on how to use this medication. Read the information carefully. Talk to your pharmacist or care team if you have questions. Talk to your care team about  the use of this medication in children. While it may be prescribed for children as young as 12 years for selected conditions, precautions do apply. Overdosage: If you think you have taken too much of this medicine contact a poison control center or emergency room at once. NOTE: This medicine is only for you. Do not share this medicine with others. What if I miss a dose? If you miss a dose and the next scheduled dose is more than 2 days away, take the missed dose as soon as possible. If you miss a dose and the next scheduled dose is less than 2 days away, do not take the missed dose. Take the next dose at your regular time. Do not take double or extra doses. If you miss your dose for 2 weeks or more, take the next dose at your regular time or call your care team to talk about how to restart this medication. What may interact with this medication? Insulin and other medications for diabetes This list may not describe all possible interactions. Give your health care provider a list of all the medicines, herbs, non-prescription drugs, or dietary supplements you use. Also tell them if you smoke, drink alcohol, or use illegal drugs. Some items may interact with your medicine. What should I watch for while using this medication? Visit your care team for regular checks on your progress. It may be some time before you see the benefit from this medication. Drink plenty of fluids while taking this medication. Check with your care team if you have severe diarrhea, nausea, and vomiting, or if you sweat a  lot. The loss of too much body fluid may make it dangerous for you to take this medication. This medication may affect blood sugar levels. Ask your care team if changes in diet or medications are needed if you have diabetes. If you or your family notice any changes in your behavior, such as new or worsening depression, thoughts of harming yourself, anxiety, other unusual or disturbing thoughts, or memory loss, call  your care team right away. Women should inform their care team if they wish to become pregnant or think they might be pregnant. Losing weight while pregnant is not advised and may cause harm to the unborn child. Talk to your care team for more information. What side effects may I notice from receiving this medication? Side effects that you should report to your care team as soon as possible: Allergic reactions--skin rash, itching, hives, swelling of the face, lips, tongue, or throat Change in vision Dehydration--increased thirst, dry mouth, feeling faint or lightheaded, headache, dark yellow or brown urine Gallbladder problems--severe stomach pain, nausea, vomiting, fever Heart palpitations--rapid, pounding, or irregular heartbeat Kidney injury--decrease in the amount of urine, swelling of the ankles, hands, or feet Pancreatitis--severe stomach pain that spreads to your back or gets worse after eating or when touched, fever, nausea, vomiting Thoughts of suicide or self-harm, worsening mood, feelings of depression Thyroid cancer--new mass or lump in the neck, pain or trouble swallowing, trouble breathing, hoarseness Side effects that usually do not require medical attention (report to your care team if they continue or are bothersome): Diarrhea Loss of appetite Nausea Stomach pain Vomiting This list may not describe all possible side effects. Call your doctor for medical advice about side effects. You may report side effects to FDA at 1-800-FDA-1088. Where should I keep my medication? Keep out of the reach of children and pets. Refrigeration (preferred): Store in the refrigerator. Do not freeze. Keep this medication in the original container until you are ready to take it. Get rid of any unused medication after the expiration date. Room temperature: If needed, prior to cap removal, the pen can be stored at room temperature for up to 28 days. Protect from light. If it is stored at room  temperature, get rid of any unused medication after 28 days or after it expires, whichever is first. It is important to get rid of the medication as soon as you no longer need it or it is expired. You can do this in two ways: Take the medication to a medication take-back program. Check with your pharmacy or law enforcement to find a location. If you cannot return the medication, follow the directions in the [place]. NOTE: This sheet is a summary. It may not cover all possible information. If you have questions about this medicine, talk to your doctor, pharmacist, or health care provider.  2023 Elsevier/Gold Standard (2020-03-17 00:00:00)

## 2022-01-09 NOTE — Progress Notes (Signed)
Assessment and Plan:  Monica [name] was seen today for an episodic visit.  Diagnoses and all order for this visit:  Obesity (BMI 30.0-34.9) Discussed appropriate BMI Goal of losing 1 lb per month. Diet modification. Physical activity. Encouraged/praised to build confidence.  - Semaglutide-Weight Management (WEGOVY) 0.25 MG/0.5ML SOAJ; Inject 0.25 mg into the skin once a week.  Dispense: 2 mL; Refill: 0  Hyperlipidemia, unspecified hyperlipidemia type Controlled and improved Continue lifestyle modifications. Recommended diet heavy in fruits and veggies, omega 3's. Decrease consumption of animal meats, cheeses, and dairy products. Remain active and exercise as tolerated. Continue to monitor.  - Semaglutide-Weight Management (WEGOVY) 0.25 MG/0.5ML SOAJ; Inject 0.25 mg into the skin once a week.  Dispense: 2 mL; Refill: 0  Medication management All medications discussed and reviewed in full. All questions and concerns regarding medications addressed.     Notify office for further evaluation and treatment, questions or concerns if s/s fail to improve. The risks and benefits of my recommendations, as well as other treatment options were discussed with the patient today. Questions were answered.  Further disposition pending results of labs. Discussed med's effects and SE's.    Over 20 minutes of exam, counseling, chart review, and critical decision making was performed.   Future Appointments  Date Time Provider [place]  01/09/2022  9:30 AM Darrol Jump, NP [facility] None  02/27/2022  9:30 AM Darrol Jump, NP [facility] None  11/20/2022  9:00 AM Darrol Jump, NP [facility] None    ------------------------------------------------------------------------------------------------------------------   HPI LMP 05/15/2010   61 y.o.female presents for evaluation of weight management.  She is wanting to lose weight to help improve her knee pain.  She has used  Phentermine in the past and was successful with a weight loss of approximately 20 lb but unable to tolerate d/t SE most notably exacerbates insomnia.  She denies any SE from the medication including CP, heart palpitations, increased thirst.  Body mass index is 31.76 kg/m.  She is working on diet and exercise.  Her BP is well controlled.  BP Readings from Last 3 Encounters:  11/17/21 112/76  03/30/21 110/76  11/03/20 128/80    She is not on cholesterol medication and denies myalgias. Her cholesterol is now at goal. The cholesterol last visit was:   Lab Results  Component Value Date   CHOL 169 11/17/2021   HDL 59 11/17/2021   LDLCALC 93 11/17/2021   TRIG 79 11/17/2021   CHOLHDL 2.9 11/17/2021     Past Medical History:  Diagnosis Date   Anal fissure    DDD (degenerative disc disease), cervical    DDD (degenerative disc disease), lumbar    Hyperlipidemia    Hypertension    Patient denies.   IBS (irritable bowel syndrome)    Lower back pain    Neck pain    Plantar fasciitis of left foot    Plantar fasciitis of right foot    Unspecified vitamin D deficiency      Allergies  Allergen Reactions   Aspirin Other (See Comments)    GI upset   Mobic [Meloxicam] Other (See Comments)    GI upset   Wellbutrin [Bupropion] Other (See Comments)    headache   Z-Pak [Azithromycin] Other (See Comments)    thrush   Penicillins Rash    Current Outpatient Medications on File Prior to Visit  Medication Sig   acyclovir (ZOVIRAX) 800 MG tablet Take 1 tablet (800 mg total) by mouth daily as needed.   clobetasol (TEMOVATE)  0.05 % external solution Apply 1 application topically 2 (two) times daily as needed. For scalp psoriasis.   Cyanocobalamin (B-12) 500 MCG TABS Take by mouth.   HYDROcodone-Acetaminophen (NORCO PO) Take by mouth as needed.   meclizine (ANTIVERT) 25 MG tablet Take 25 mg by mouth 3 (three) times daily as needed for dizziness.   triamcinolone cream (KENALOG) 0.5 % Apply  1 application. topically 2 (two) times daily.   No current facility-administered medications on file prior to visit.    ROS: all negative except what is noted in the HPI.   Physical Exam:  LMP 05/15/2010   General Appearance: NAD.  Awake, conversant and cooperative. Eyes: PERRLA, EOMs intact.  Sclera white.  Conjunctiva without erythema. Sinuses: No frontal/maxillary tenderness.  No nasal discharge. Nares patent.  ENT/Mouth: Ext aud canals clear.  Bilateral TMs w/DOL and without erythema or bulging. Hearing intact.  Posterior pharynx without swelling or exudate.  Tonsils without swelling or erythema.  Neck: Supple.  No masses, nodules or thyromegaly. Respiratory: Effort is regular with non-labored breathing. Breath sounds are equal bilaterally without rales, rhonchi, wheezing or stridor.  Cardio: RRR with no MRGs. Brisk peripheral pulses without edema.  Abdomen: Active BS in all four quadrants.  Soft and non-tender without guarding, rebound tenderness, hernias or masses. Lymphatics: Non tender without lymphadenopathy.  Musculoskeletal: Full ROM, 5/5 strength, normal ambulation.  No clubbing or cyanosis. Skin: Appropriate color for ethnicity. Warm without rashes, lesions, ecchymosis, ulcers.  Neuro: CN II-XII grossly normal. Normal muscle tone without cerebellar symptoms and intact sensation.   Psych: AO X 3,  appropriate mood and affect, insight and judgment.     Darrol Jump, NP 9:02 AM [facility]

## 2022-01-09 NOTE — Telephone Encounter (Signed)
PA complete and submitted for [facility].   PA approved.

## 2022-01-18 ENCOUNTER — Encounter: Payer: Self-pay | Admitting: Nurse Practitioner

## 2022-01-25 ENCOUNTER — Other Ambulatory Visit: Payer: Self-pay | Admitting: Nurse Practitioner

## 2022-01-25 DIAGNOSIS — E669 Obesity, unspecified: Secondary | ICD-10-CM

## 2022-01-25 DIAGNOSIS — E785 Hyperlipidemia, unspecified: Secondary | ICD-10-CM

## 2022-01-26 MED ORDER — PHENTERMINE HCL 37.5 MG PO TABS: ORAL_TABLET | ORAL | 0 refills | Status: DC

## 2022-02-13 ENCOUNTER — Ambulatory Visit: Payer: BC Managed Care – PPO | Admitting: Nurse Practitioner

## 2022-02-27 ENCOUNTER — Ambulatory Visit: Payer: BC Managed Care – PPO | Admitting: Nurse Practitioner

## 2022-03-05 ENCOUNTER — Other Ambulatory Visit: Payer: Self-pay | Admitting: Nurse Practitioner

## 2022-03-22 ENCOUNTER — Encounter: Payer: Self-pay | Admitting: Radiology

## 2022-03-29 ENCOUNTER — Encounter: Payer: Self-pay | Admitting: Nurse Practitioner

## 2022-03-29 ENCOUNTER — Ambulatory Visit: Payer: BC Managed Care – PPO | Admitting: Nurse Practitioner

## 2022-03-29 VITALS — BP 110/80 | HR 68 | Temp 97.7°F | Ht 64.0 in | Wt 179.4 lb

## 2022-03-29 DIAGNOSIS — G4700 Insomnia, unspecified: Secondary | ICD-10-CM

## 2022-03-29 DIAGNOSIS — Z79899 Other long term (current) drug therapy: Secondary | ICD-10-CM | POA: Diagnosis not present

## 2022-03-29 DIAGNOSIS — E785 Hyperlipidemia, unspecified: Secondary | ICD-10-CM

## 2022-03-29 DIAGNOSIS — R030 Elevated blood-pressure reading, without diagnosis of hypertension: Secondary | ICD-10-CM

## 2022-03-29 DIAGNOSIS — T148XXA Other injury of unspecified body region, initial encounter: Secondary | ICD-10-CM

## 2022-03-29 DIAGNOSIS — M5030 Other cervical disc degeneration, unspecified cervical region: Secondary | ICD-10-CM

## 2022-03-29 DIAGNOSIS — E669 Obesity, unspecified: Secondary | ICD-10-CM | POA: Diagnosis not present

## 2022-03-29 DIAGNOSIS — M5136 Other intervertebral disc degeneration, lumbar region: Secondary | ICD-10-CM

## 2022-03-29 DIAGNOSIS — R12 Heartburn: Secondary | ICD-10-CM

## 2022-03-29 DIAGNOSIS — E559 Vitamin D deficiency, unspecified: Secondary | ICD-10-CM

## 2022-03-29 NOTE — Progress Notes (Signed)
Assessment and Plan:  Monica [name] was seen today for an episodic visit.  Diagnoses and all order for this visit:  Obesity (BMI 30.0-34.9) Improving Continue Phentermine Discussed appropriate BMI Goal of losing 1 lb per month. Diet modification. Physical activity. Encouraged/praised to build confidence.  Hyperlipidemia, unspecified hyperlipidemia type Controlled and improved Continue lifestyle modifications. Recommended diet heavy in fruits and veggies, omega 3's. Decrease consumption of animal meats, cheeses, and dairy products. Remain active and exercise as tolerated. Continue to monitor.  Medication management All medications discussed and reviewed in full. All questions and concerns regarding medications addressed.    Vitamin D Deficiency Monitor levels for goal of 60-100 Continue supplement  Elevated BP without dx of HTN Controlled Education: Reviewed 'ABCs' of diabetes management  Discussed goals to be met and/or maintained include A1C (<7) Blood pressure (<130/80) Cholesterol (LDL <70) Continue Eye Exam yearly  Continue Dental Exam Q6 mo Discussed dietary recommendations Discussed Physical Activity recommendations Check A1C  DDD Follows with [facility] Continue Hydrocodone ([place])  HLA B27 Continue to monitor  Insomnia Discussed good sleep hygiene. Establish bed and wake times. Sleep restriction-only sleep estimated hrs sleep. Bed only for sex and sleep, only sleep when sleepy, out of bed if anxious (stimulus control). Reviewed relaxation techniques, mindful meditations. Expected sleep duration. Addressed worries about not sleeping.   Anxiety Reviewed relaxation techniques.  Sleep hygiene. Recommended Cognitive Behavioral Therapy (CBT). Recommended mindfulness meditation and exercise.   Insight-oriented psychotherapy given for 16 minutes exclusively. Psychoeducation:  encouraged personality growth wand development through coping  techniques and problem-solving skills. Limit/Decrease/Monitor drug/alcohol intake.    Removal of splinter left hand Cleansed with alcohol Removed splinter with sterile tweezers Cleansed again with alcohol. Apply antibiotic ointment and cover with band-aide Continue to monitor for any increase in s/s of infection, redness, warmth, swelling. Contact office if s/s fail to improve  Heartburn Continue Cimetidine Lifestyle modification:  wt loss, avoid meals 2-3h before bedtime. Consider eliminating food triggers:  chocolate, caffeine, EtOH, acid/spicy food.  No orders of the defined types were placed in this encounter.  Review of blood work is stable - will assess again in 3 months.    Notify office for further evaluation and treatment, questions or concerns if any reported s/s fail to improve.   The patient was advised to call back or seek an in-person evaluation if any symptoms worsen or if the condition fails to improve as anticipated.   Further disposition pending results of labs. Discussed med's effects and SE's.    I discussed the assessment and treatment plan with the patient. The patient was provided an opportunity to ask questions and all were answered. The patient agreed with the plan and demonstrated an understanding of the instructions.  Discussed med's effects and SE's. Screening labs and tests as requested with regular follow-up as recommended.  I provided 20 minutes of face-to-face time during this encounter including counseling, chart review, and critical decision making was preformed.  Today's Plan of Care is based on a patient-centered health care approach known as shared decision making - the decisions, tests and treatments allow for patient preferences and values to be balanced with clinical evidence.    Future Appointments  Date Time Provider [place]  11/20/2022  9:00 AM Darrol Jump, NP [facility] None     ------------------------------------------------------------------------------------------------------------------   HPI Ht '5\' 4"'$  (1.626 m)   Wt 179 lb 6.4 oz (81.4 kg)   LMP 05/15/2010   BMI 30.79 kg/m   62 y.o.female presents for  general follow up.  She is continuing to lose weight by starting Phentermine.  Is able to tolerate.  Denies any SE of heart palpitations, chest pain, insomnia.  BMI is Body mass index is 30.79 kg/m., she has been working on diet and exercise. Wt Readings from Last 3 Encounters:  03/29/22 179 lb 6.4 oz (81.4 kg)  01/09/22 185 lb (83.9 kg)  11/17/21 188 lb 9.6 oz (85.5 kg)   She followed up with [facility] on. December 21st of 2023. follows for chronic cervical lumber and bilateral knee pain. utilizing hydrocodone. 503. 25 on a limited basis.  Her BP is well controlled.  BP Readings from Last 3 Encounters:  01/09/22 110/76  11/17/21 112/76  03/30/21 110/76    She is not on cholesterol medication and denies myalgias. Her cholesterol is now at goal. The cholesterol last visit was:   Lab Results  Component Value Date   CHOL 169 11/17/2021   HDL 59 11/17/2021   LDLCALC 93 11/17/2021   TRIG 79 11/17/2021   CHOLHDL 2.9 11/17/2021   She does note increase in heartburn, most notable at night.  She has some increase in belching.  Unsure of trigger.  She is continuing weight loss.  Has started Cimetidine with effectiveness.    She is concerned for a nodule on the top of her left hand thumb side.  The nodule is firm with associated redness and raised and painful.  Reports a very large area at first that appeared one week ago.  It has das decreased in size but still present.  She has tried to manipulate and drain he area with some benefit.  Denies fever chills.  Repots surrounding skin WNL.    Past Medical History:  Diagnosis Date   Anal fissure    DDD (degenerative disc disease), cervical    DDD (degenerative disc disease),  lumbar    Hyperlipidemia    Hypertension    Patient denies.   IBS (irritable bowel syndrome)    Lower back pain    Neck pain    Plantar fasciitis of left foot    Plantar fasciitis of right foot    Unspecified vitamin D deficiency      Allergies  Allergen Reactions   Aspirin Other (See Comments)    GI upset   Mobic [Meloxicam] Other (See Comments)    GI upset   Wellbutrin [Bupropion] Other (See Comments)    headache   Z-Pak [Azithromycin] Other (See Comments)    thrush   Penicillins Rash    Current Outpatient Medications on File Prior to Visit  Medication Sig   acyclovir (ZOVIRAX) 800 MG tablet Take 1 tablet (800 mg total) by mouth daily as needed.   clobetasol (TEMOVATE) 0.05 % external solution Apply 1 application topically 2 (two) times daily as needed. For scalp psoriasis.   Cyanocobalamin (B-12) 500 MCG TABS Take by mouth.   HYDROcodone-Acetaminophen (NORCO PO) Take by mouth as needed.   meclizine (ANTIVERT) 25 MG tablet Take 25 mg by mouth 3 (three) times daily as needed for dizziness.   phentermine (ADIPEX-P) 37.5 MG tablet TAKE 1/2 TO 1 TABLET EVERY MORNING FOR DIETING & WEIGHT LOSS   triamcinolone cream (KENALOG) 0.5 % Apply 1 application. topically 2 (two) times daily.   Semaglutide-Weight Management (WEGOVY) 0.25 MG/0.5ML SOAJ Inject 0.25 mg into the skin once a week. (Patient not taking: Reported on 03/29/2022)   No current facility-administered medications on file prior to visit.    ROS: all  negative except what is noted in the HPI.   Physical Exam:  Ht '5\' 4"'$  (1.626 m)   Wt 179 lb 6.4 oz (81.4 kg)   LMP 05/15/2010   BMI 30.79 kg/m   General Appearance: NAD.  Awake, conversant and cooperative. Eyes: PERRLA, EOMs intact.  Sclera white.  Conjunctiva without erythema. Sinuses: No frontal/maxillary tenderness.  No nasal discharge. Nares patent.  ENT/Mouth: Ext aud canals clear.  Bilateral TMs w/DOL and without erythema or bulging. Hearing intact.  Posterior  pharynx without swelling or exudate.  Tonsils without swelling or erythema.  Neck: Supple.  No masses, nodules or thyromegaly. Respiratory: Effort is regular with non-labored breathing. Breath sounds are equal bilaterally without rales, rhonchi, wheezing or stridor.  Cardio: RRR with no MRGs. Brisk peripheral pulses without edema.  Abdomen: Active BS in all four quadrants.  Soft and non-tender without guarding, rebound tenderness, hernias or masses. Lymphatics: Non tender without lymphadenopathy.  Musculoskeletal: Full ROM, 5/5 strength, normal ambulation.  No clubbing or cyanosis. Skin: Left posterior hand along thumb side with approximately 1 cm round raised erythematous firm nodule with dark colored small protruding splinter. Painful to touch. Appropriate color for ethnicity. Warm without rashes, lesions, ecchymosis, ulcers. Surrounding skin intact.  Neuro: CN II-XII grossly normal. Normal muscle tone without cerebellar symptoms and intact sensation.   Psych: AO X 3,  appropriate mood and affect, insight and judgment.     Darrol Jump, NP 9:14 AM [facility]

## 2022-03-29 NOTE — Patient Instructions (Signed)
Take 5,000 IU Daily   Vitamin D Deficiency Vitamin D deficiency is when your body does not have enough vitamin D. Vitamin D is important because: It helps your body use certain minerals. It helps to keep your bones healthy. It lessens irritation and swelling (inflammation). It helps the body's defense system (immune system) work better. Not getting enough vitamin D can make your bones soft. What are the causes? Not eating enough foods that have vitamin D in them. Not getting enough sun. Having diseases that make it hard for your body to take in vitamin D. Having had part of your stomach or part of your small intestine taken out. What increases the risk? Being an older adult. Not spending much time outdoors. Living in a long-term care center. Having dark skin. Taking certain medicines. Being overweight or very overweight (obese). Having long-term (chronic) kidney or liver disease. What are the signs or symptoms? In mild cases, there may be no symptoms. If the condition is very bad, symptoms may include: Bone pain. Muscle pain. Not being able to walk normally. Bones that break easily. Joint pain. How is this treated? Treatment may include taking supplements as told by your doctor. Your doctor will tell you what dose is best for you. This may include taking: Vitamin D. Calcium. Follow these instructions at home: Eating and drinking Eat foods that have vitamin D in them, such as: Dairy products, cereals, or juices that have vitamin D added to them (are fortified). Check the label. Fish, such as salmon or trout. Eggs. The vitamin D is in the yolk. Mushrooms that were treated with UV light. Beef liver. The items listed above may not be a complete list of foods and beverages you can eat and drink. Contact a dietitian for more information. General instructions Take over-the-counter and prescription medicines only as told by your doctor. Take supplements only as told by your  doctor. Get sunlight in a safe way. Do not use a tanning bed. Stay at a healthy weight. Lose weight if you need to. Keep all follow-up visits. How is this prevented? Eating foods that naturally have vitamin D in them. Eating or drinking foods and drinks that have vitamin D added to them, such as cereals, juices, and milk. Taking vitamin D or a multivitamin that has vitamin D in it. Being in the sun. Your body makes vitamin D when your skin gets sunlight. Contact a doctor if: Your symptoms do not go away. You feel like you may vomit (nauseous). You vomit. You poop less often than normal, or you have trouble pooping (constipation). Summary Vitamin D deficiency is when your body does not have enough vitamin D. Vitamin D helps to keep your bones healthy. This condition is often treated by taking a supplement. Your doctor will tell you what dose is best for you. This information is not intended to replace advice given to you by your health care provider. Make sure you discuss any questions you have with your health care provider. Document Revised: 10/07/2020 Document Reviewed: 10/07/2020 Elsevier Patient Education  [place].

## 2022-04-05 ENCOUNTER — Other Ambulatory Visit: Payer: Self-pay | Admitting: Nurse Practitioner

## 2022-05-11 ENCOUNTER — Other Ambulatory Visit: Payer: Self-pay | Admitting: Nurse Practitioner

## 2022-06-15 DIAGNOSIS — M542 Cervicalgia: Secondary | ICD-10-CM | POA: Diagnosis not present

## 2022-06-15 DIAGNOSIS — F1120 Opioid dependence, uncomplicated: Secondary | ICD-10-CM | POA: Diagnosis not present

## 2022-06-15 DIAGNOSIS — M5136 Other intervertebral disc degeneration, lumbar region: Secondary | ICD-10-CM | POA: Diagnosis not present

## 2022-06-22 ENCOUNTER — Other Ambulatory Visit: Payer: Self-pay | Admitting: Nurse Practitioner

## 2022-06-25 ENCOUNTER — Telehealth: Payer: Self-pay

## 2022-06-25 NOTE — Telephone Encounter (Signed)
Prior auth completed and submitted. Approved.

## 2022-07-16 ENCOUNTER — Ambulatory Visit: Payer: BC Managed Care – PPO | Admitting: Nurse Practitioner

## 2022-08-09 ENCOUNTER — Ambulatory Visit: Payer: BC Managed Care – PPO | Admitting: Nurse Practitioner

## 2022-08-09 ENCOUNTER — Encounter: Payer: Self-pay | Admitting: Nurse Practitioner

## 2022-08-09 VITALS — BP 130/88 | HR 72 | Temp 97.5°F | Ht 64.0 in | Wt 180.6 lb

## 2022-08-09 DIAGNOSIS — E559 Vitamin D deficiency, unspecified: Secondary | ICD-10-CM | POA: Diagnosis not present

## 2022-08-09 DIAGNOSIS — M5030 Other cervical disc degeneration, unspecified cervical region: Secondary | ICD-10-CM

## 2022-08-09 DIAGNOSIS — E669 Obesity, unspecified: Secondary | ICD-10-CM

## 2022-08-09 DIAGNOSIS — F419 Anxiety disorder, unspecified: Secondary | ICD-10-CM

## 2022-08-09 DIAGNOSIS — Z79899 Other long term (current) drug therapy: Secondary | ICD-10-CM | POA: Diagnosis not present

## 2022-08-09 DIAGNOSIS — Z1589 Genetic susceptibility to other disease: Secondary | ICD-10-CM

## 2022-08-09 DIAGNOSIS — E785 Hyperlipidemia, unspecified: Secondary | ICD-10-CM | POA: Diagnosis not present

## 2022-08-09 DIAGNOSIS — R030 Elevated blood-pressure reading, without diagnosis of hypertension: Secondary | ICD-10-CM

## 2022-08-09 DIAGNOSIS — G4709 Other insomnia: Secondary | ICD-10-CM

## 2022-08-09 DIAGNOSIS — R12 Heartburn: Secondary | ICD-10-CM

## 2022-08-09 LAB — CBC WITH DIFFERENTIAL/PLATELET
Absolute Monocytes: 446 cells/uL (ref 200–950)
Basophils Absolute: 31 cells/uL (ref 0–200)
Basophils Relative: 0.5 %
Eosinophils Absolute: 87 cells/uL (ref 15–500)
Eosinophils Relative: 1.4 %
HCT: 39.9 % (ref 35.0–45.0)
Hemoglobin: 12.6 g/dL (ref 11.7–15.5)
Lymphs Abs: 2740 cells/uL (ref 850–3900)
MCH: 25.9 pg — ABNORMAL LOW (ref 27.0–33.0)
MCHC: 31.6 g/dL — ABNORMAL LOW (ref 32.0–36.0)
MCV: 81.9 fL (ref 80.0–100.0)
MPV: 10.6 fL (ref 7.5–12.5)
Monocytes Relative: 7.2 %
Neutro Abs: 2895 cells/uL (ref 1500–7800)
Neutrophils Relative %: 46.7 %
Platelets: 236 10*3/uL (ref 140–400)
RBC: 4.87 10*6/uL (ref 3.80–5.10)
RDW: 12.2 % (ref 11.0–15.0)
Total Lymphocyte: 44.2 %
WBC: 6.2 10*3/uL (ref 3.8–10.8)

## 2022-08-09 MED ORDER — WEGOVY 0.25 MG/0.5ML ~~LOC~~ SOAJ
0.2500 mg | SUBCUTANEOUS | 0 refills | Status: DC
Start: 2022-08-09 — End: 2022-08-24

## 2022-08-09 NOTE — Progress Notes (Signed)
Assessment and Plan:  Monica Monica [name] was seen today for an episodic visit.  Diagnoses and all order for this visit:  Obesity (BMI 30.0-34.9) Improving Continue Phentermine Discussed starting Wegovy  Discussed appropriate BMI Diet modification. Physical activity. Encouraged/praised to build confidence.  Hyperlipidemia, unspecified hyperlipidemia type Controlled and improved Continue lifestyle modifications. Recommended diet heavy in fruits and veggies, omega 3's. Decrease consumption of animal meats, cheeses, and dairy products. Remain active and exercise as tolerated. Continue to monitor.  Medication management All medications discussed and reviewed in full. All questions and concerns regarding medications addressed.    Vitamin D Deficiency Monitor levels for goal of 60-100 Continue supplement  Elevated BP without dx of HTN Controlled Education: Reviewed 'ABCs' of diabetes management  Discussed goals to be met and/or maintained include A1C (<7) Blood pressure (<130/80) Cholesterol (LDL <70) Continue Eye Exam yearly  Continue Dental Exam Q6 mo Discussed dietary recommendations Discussed Physical Activity recommendations Check A1C  DDD Follows with [facility] Continue Hydrocodone (NORCO)  HLA B27 Continue to monitor  Insomnia Discussed good sleep hygiene. Establish bed and wake times. Sleep restriction-only sleep estimated hrs sleep. Bed only for sex and sleep, only sleep when sleepy, out of bed if anxious (stimulus control). Reviewed relaxation techniques, mindful meditations. Expected sleep duration. Addressed worries about not sleeping.   Anxiety Reviewed relaxation techniques.  Sleep hygiene. Recommended Cognitive Behavioral Therapy (CBT). Recommended mindfulness meditation and exercise PRN Encouraged personality growth wand development through coping techniques and problem-solving skills. Limit/Decrease/Monitor drug/alcohol intake.     Heartburn Continue Cimetidine Lifestyle modification:  wt loss, avoid meals 2-3h before bedtime. Consider eliminating food triggers:  chocolate, caffeine, EtOH, acid/spicy food.  Orders Placed This Encounter  Procedures   CBC with Differential/Platelet   COMPLETE METABOLIC PANEL WITH GFR   Lipid panel   VITAMIN D 25 Hydroxy (Vit-D Deficiency, Fractures)   Meds ordered this encounter  Medications   Semaglutide-Weight Management (WEGOVY) 0.25 MG/0.5ML SOAJ    Sig: Inject 0.25 mg into the skin once a week.    Dispense:  2 mL    Refill:  0    Order Specific Question:   Supervising Provider    Answer:   Lucky Cowboy [phone]   Notify office for further evaluation and treatment, questions or concerns if any reported s/s fail to improve.   The patient was advised to call back or seek an in-person evaluation if any symptoms worsen or if the condition fails to improve as anticipated.   Further disposition pending results of labs. Discussed med's effects and SE's.    I discussed the assessment and treatment plan with the patient. The patient was provided an opportunity to ask questions and all were answered. The patient agreed with the plan and demonstrated an understanding of the instructions.  Discussed med's effects and SE's. Screening labs and tests as requested with regular follow-up as recommended.  I provided 20 minutes of face-to-face time during this encounter including counseling, chart review, and critical decision making was preformed.  Today's Plan of Care is based on a patient-centered health care approach known as shared decision making - the decisions, tests and treatments allow for patient preferences and values to be balanced with clinical evidence.    Future Appointments  Date Time Provider Department Center  11/20/2022  9:00 AM Dorothea Yow, Archie Patten, NP [facility] None     ------------------------------------------------------------------------------------------------------------------   HPI BP 130/88   Pulse 72   Temp (!) 97.5 F (36.4 C)   Ht 5\' 4"  (1.626 m)  Wt 180 lb 9.6 oz (81.9 kg)   LMP 05/15/2010   SpO2 99%   BMI 31.00 kg/m   62 Monica [name] presents for general follow up.  Overall she reports feeling well today.  She has no new concerns at this time.   She is continuing to lose weight by starting Phentermine.  Is able to tolerate.  Denies any SE of heart palpitations, chest pain, insomnia.  BMI is Body mass index is 31 kg/m., she has been working on diet and exercise.  She does not eat breakfast.  Drinks water in the mornings.  Eats lunch around 1300.  Most often a tomato sandwich then dinner around 6 pm but does not often finish her plate.  She is trying to remain active by walking when not too hot. Wt Readings from Last 3 Encounters:  08/09/22 180 lb 9.6 oz (81.9 kg)  03/29/22 179 lb 6.4 oz (81.4 kg)  01/09/22 185 lb (83.9 kg)   She followed up with [facility] on. December 21st of 2023 - follows for chronic cervical lumber and bilateral knee pain. utilizing hydrocodone PRN.  Her BP is well controlled but slightly elevated in clnic today.  BP Readings from Last 3 Encounters:  08/09/22 130/88  03/29/22 110/80  01/09/22 110/76    She is not on cholesterol medication and denies myalgias. Her cholesterol is now at goal. The cholesterol last visit was:   Lab Results  Component Value Date   CHOL 169 11/17/2021   HDL 59 11/17/2021   LDLCALC 93 11/17/2021   TRIG 79 11/17/2021   CHOLHDL 2.9 11/17/2021   She does note increase in heartburn, most notable at night.  She has some increase in belching.  Unsure of trigger.  She is continuing weight loss.  Has started Cimetidine with effectiveness.    Past Medical History:  Diagnosis Date   Anal fissure    DDD (degenerative disc disease), cervical    DDD  (degenerative disc disease), lumbar    Hyperlipidemia    Hypertension    Patient denies.   IBS (irritable bowel syndrome)    Lower back pain    Neck pain    Plantar fasciitis of left foot    Plantar fasciitis of right foot    Unspecified vitamin D deficiency      Allergies  Allergen Reactions   Aspirin Other (See Comments)    GI upset   Mobic [Meloxicam] Other (See Comments)    GI upset   Wellbutrin [Bupropion] Other (See Comments)    headache   Z-Pak [Azithromycin] Other (See Comments)    thrush   Penicillins Rash    Current Outpatient Medications on File Prior to Visit  Medication Sig   acyclovir (ZOVIRAX) 800 MG tablet Take 1 tablet (800 mg total) by mouth daily as needed.   clobetasol (TEMOVATE) 0.05 % external solution Apply 1 application topically 2 (two) times daily as needed. For scalp psoriasis.   Cyanocobalamin (B-12) 500 MCG TABS Take by mouth.   HYDROcodone-Acetaminophen (NORCO PO) Take by mouth as needed.   meclizine (ANTIVERT) 25 MG tablet Take 25 mg by mouth 3 (three) times daily as needed for dizziness.   phentermine (ADIPEX-P) 37.5 MG tablet TAKE 1/2 TO 1 TABLET EVERY MORNING FOR DIETING & WEIGHT LOSS   triamcinolone cream (KENALOG) 0.5 % Apply 1 application. topically 2 (two) times daily.   vitamin C (ASCORBIC ACID) 250 MG tablet Take 250 mg by mouth daily.   VITAMIN D, CHOLECALCIFEROL, PO  Take 5,000 Units by mouth.   Semaglutide-Weight Management (WEGOVY) 0.25 MG/0.5ML SOAJ Inject 0.25 mg into the skin once a week. (Patient not taking: Reported on 08/09/2022)   No current facility-administered medications on file prior to visit.    ROS: all negative except what is noted in the HPI.   Physical Exam:  BP 130/88   Pulse 72   Temp (!) 97.5 F (36.4 C)   Ht 5\' 4"  (1.626 m)   Wt 180 lb 9.6 oz (81.9 kg)   LMP 05/15/2010   SpO2 99%   BMI 31.00 kg/m   General Appearance: NAD.  Awake, conversant and cooperative. Eyes: PERRLA, EOMs intact.  Sclera  white.  Conjunctiva without erythema. Sinuses: No frontal/maxillary tenderness.  No nasal discharge. Nares patent.  ENT/Mouth: Ext aud canals clear.  Bilateral TMs w/DOL and without erythema or bulging. Hearing intact.  Posterior pharynx without swelling or exudate.  Tonsils without swelling or erythema.  Neck: Supple.  No masses, nodules or thyromegaly. Respiratory: Effort is regular with non-labored breathing. Breath sounds are equal bilaterally without rales, rhonchi, wheezing or stridor.  Cardio: RRR with no MRGs. Brisk peripheral pulses without edema.  Abdomen: Active BS in all four quadrants.  Soft and non-tender without guarding, rebound tenderness, hernias or masses. Lymphatics: Non tender without lymphadenopathy.  Musculoskeletal: Full ROM, 5/5 strength, normal ambulation.  No clubbing or cyanosis. Skin: Warm.  Appropriate color for ethnicity. Neuro: CN II-XII grossly normal. Normal muscle tone without cerebellar symptoms and intact sensation.   Psych: AO X 3,  appropriate mood and affect, insight and judgment.     Adela Glimpse, NP 9:19 AM [facility]

## 2022-08-09 NOTE — Patient Instructions (Signed)

## 2022-08-14 ENCOUNTER — Other Ambulatory Visit: Payer: Self-pay | Admitting: Nurse Practitioner

## 2022-08-24 ENCOUNTER — Other Ambulatory Visit: Payer: Self-pay

## 2022-08-24 ENCOUNTER — Telehealth: Payer: Self-pay | Admitting: Nurse Practitioner

## 2022-08-24 DIAGNOSIS — E669 Obesity, unspecified: Secondary | ICD-10-CM

## 2022-08-24 DIAGNOSIS — E785 Hyperlipidemia, unspecified: Secondary | ICD-10-CM

## 2022-08-24 MED ORDER — WEGOVY 0.25 MG/0.5ML ~~LOC~~ SOAJ: 0.2500 mg | SUBCUTANEOUS | 0 refills | Status: DC

## 2022-08-24 NOTE — Telephone Encounter (Signed)
Patient states that she got a message from [facility] to contact our office to check on status of Wegovy prior Authorization. RX was sent in July, but no update.

## 2022-08-27 ENCOUNTER — Telehealth: Payer: Self-pay | Admitting: Nurse Practitioner

## 2022-08-27 DIAGNOSIS — E785 Hyperlipidemia, unspecified: Secondary | ICD-10-CM

## 2022-08-27 DIAGNOSIS — E669 Obesity, unspecified: Secondary | ICD-10-CM

## 2022-08-27 MED ORDER — WEGOVY 0.25 MG/0.5ML ~~LOC~~ SOAJ: 0.2500 mg | SUBCUTANEOUS | 0 refills | Status: DC

## 2022-08-27 NOTE — Addendum Note (Signed)
Addended by: Dionicio Stall on: 08/27/2022 10:56 AM   Modules accepted: Orders

## 2022-08-27 NOTE — Telephone Encounter (Signed)
[facility] spoke with [facility] and they are out of stock in [place]. She called [facility] in [place] and they have it. Will you switch rx to the [facility] please?

## 2022-08-30 ENCOUNTER — Telehealth: Payer: Self-pay

## 2022-08-30 ENCOUNTER — Other Ambulatory Visit: Payer: Self-pay

## 2022-08-30 DIAGNOSIS — E785 Hyperlipidemia, unspecified: Secondary | ICD-10-CM

## 2022-08-30 DIAGNOSIS — E669 Obesity, unspecified: Secondary | ICD-10-CM

## 2022-08-30 MED ORDER — WEGOVY 0.25 MG/0.5ML ~~LOC~~ SOAJ
0.2500 mg | SUBCUTANEOUS | 0 refills | Status: DC
Start: 2022-08-30 — End: 2022-09-04

## 2022-08-30 NOTE — Telephone Encounter (Signed)
PA for [facility] completed and approved

## 2022-09-04 ENCOUNTER — Telehealth: Payer: Self-pay | Admitting: Nurse Practitioner

## 2022-09-04 DIAGNOSIS — E785 Hyperlipidemia, unspecified: Secondary | ICD-10-CM

## 2022-09-04 DIAGNOSIS — E669 Obesity, unspecified: Secondary | ICD-10-CM

## 2022-09-04 MED ORDER — WEGOVY 0.25 MG/0.5ML ~~LOC~~ SOAJ
0.2500 mg | SUBCUTANEOUS | 0 refills | Status: DC
Start: 2022-09-04 — End: 2023-07-08

## 2022-09-04 NOTE — Telephone Encounter (Signed)
Patient states that all local pharmacies are still out of stock on Wegovy. [facility] said they have it if you will send in script for a 90 day supply.

## 2022-09-04 NOTE — Addendum Note (Signed)
Addended by: Dionicio Stall on: 09/04/2022 03:07 PM   Modules accepted: Orders

## 2022-09-12 ENCOUNTER — Telehealth: Payer: Self-pay | Admitting: Nurse Practitioner

## 2022-09-12 NOTE — Telephone Encounter (Signed)
[facility] said [facility] not [facility] wegovy .25 mg in stock. She's looking for other [facility] to call to see if they have it.

## 2022-09-13 DIAGNOSIS — Z6830 Body mass index (BMI) 30.0-30.9, adult: Secondary | ICD-10-CM | POA: Diagnosis not present

## 2022-09-13 DIAGNOSIS — F1120 Opioid dependence, uncomplicated: Secondary | ICD-10-CM | POA: Diagnosis not present

## 2022-09-13 DIAGNOSIS — M542 Cervicalgia: Secondary | ICD-10-CM | POA: Diagnosis not present

## 2022-09-13 DIAGNOSIS — M5136 Other intervertebral disc degeneration, lumbar region: Secondary | ICD-10-CM | POA: Diagnosis not present

## 2022-10-02 ENCOUNTER — Other Ambulatory Visit: Payer: Self-pay | Admitting: Nurse Practitioner

## 2022-10-04 DIAGNOSIS — M5021 Other cervical disc displacement,  high cervical region: Secondary | ICD-10-CM | POA: Diagnosis not present

## 2022-10-04 DIAGNOSIS — M5031 Other cervical disc degeneration,  high cervical region: Secondary | ICD-10-CM | POA: Diagnosis not present

## 2022-10-04 DIAGNOSIS — M5134 Other intervertebral disc degeneration, thoracic region: Secondary | ICD-10-CM | POA: Diagnosis not present

## 2022-10-04 DIAGNOSIS — M542 Cervicalgia: Secondary | ICD-10-CM | POA: Diagnosis not present

## 2022-10-04 DIAGNOSIS — M5023 Other cervical disc displacement, cervicothoracic region: Secondary | ICD-10-CM | POA: Diagnosis not present

## 2022-11-05 ENCOUNTER — Encounter: Payer: BC Managed Care – PPO | Admitting: Nurse Practitioner

## 2022-11-07 DIAGNOSIS — M542 Cervicalgia: Secondary | ICD-10-CM | POA: Diagnosis not present

## 2022-11-15 ENCOUNTER — Other Ambulatory Visit: Payer: Self-pay | Admitting: Nurse Practitioner

## 2022-11-20 ENCOUNTER — Encounter: Payer: BC Managed Care – PPO | Admitting: Nurse Practitioner

## 2022-11-26 DIAGNOSIS — M542 Cervicalgia: Secondary | ICD-10-CM | POA: Diagnosis not present

## 2022-11-26 DIAGNOSIS — F1120 Opioid dependence, uncomplicated: Secondary | ICD-10-CM | POA: Diagnosis not present

## 2022-11-26 DIAGNOSIS — M51362 Other intervertebral disc degeneration, lumbar region with discogenic back pain and lower extremity pain: Secondary | ICD-10-CM | POA: Diagnosis not present

## 2022-12-28 ENCOUNTER — Other Ambulatory Visit: Payer: Self-pay | Admitting: Nurse Practitioner

## 2023-02-06 DIAGNOSIS — M25551 Pain in right hip: Secondary | ICD-10-CM | POA: Diagnosis not present

## 2023-02-06 DIAGNOSIS — Z6832 Body mass index (BMI) 32.0-32.9, adult: Secondary | ICD-10-CM | POA: Diagnosis not present

## 2023-02-07 ENCOUNTER — Ambulatory Visit: Payer: Managed Care, Other (non HMO) | Admitting: Physician Assistant

## 2023-02-26 DIAGNOSIS — M542 Cervicalgia: Secondary | ICD-10-CM | POA: Diagnosis not present

## 2023-02-26 DIAGNOSIS — M7631 Iliotibial band syndrome, right leg: Secondary | ICD-10-CM | POA: Diagnosis not present

## 2023-02-26 DIAGNOSIS — Z6832 Body mass index (BMI) 32.0-32.9, adult: Secondary | ICD-10-CM | POA: Diagnosis not present

## 2023-02-26 DIAGNOSIS — F1120 Opioid dependence, uncomplicated: Secondary | ICD-10-CM | POA: Diagnosis not present

## 2023-03-01 ENCOUNTER — Encounter: Payer: BC Managed Care – PPO | Admitting: Nurse Practitioner

## 2023-03-05 DIAGNOSIS — M7631 Iliotibial band syndrome, right leg: Secondary | ICD-10-CM | POA: Diagnosis not present

## 2023-03-05 DIAGNOSIS — R262 Difficulty in walking, not elsewhere classified: Secondary | ICD-10-CM | POA: Diagnosis not present

## 2023-03-05 DIAGNOSIS — M6281 Muscle weakness (generalized): Secondary | ICD-10-CM | POA: Diagnosis not present

## 2023-03-05 DIAGNOSIS — M25651 Stiffness of right hip, not elsewhere classified: Secondary | ICD-10-CM | POA: Diagnosis not present

## 2023-03-12 ENCOUNTER — Other Ambulatory Visit: Payer: Self-pay

## 2023-03-12 MED ORDER — PHENTERMINE HCL 37.5 MG PO TABS: ORAL_TABLET | ORAL | 0 refills | Status: AC

## 2023-03-17 ENCOUNTER — Encounter: Payer: Self-pay | Admitting: Radiology

## 2023-03-18 ENCOUNTER — Ambulatory Visit: Payer: Managed Care, Other (non HMO) | Admitting: Physician Assistant

## 2023-03-18 ENCOUNTER — Encounter: Payer: Self-pay | Admitting: Physician Assistant

## 2023-03-18 ENCOUNTER — Other Ambulatory Visit (INDEPENDENT_AMBULATORY_CARE_PROVIDER_SITE_OTHER): Payer: Self-pay

## 2023-03-18 ENCOUNTER — Ambulatory Visit: Payer: Self-pay | Admitting: Physician Assistant

## 2023-03-18 DIAGNOSIS — G8929 Other chronic pain: Secondary | ICD-10-CM

## 2023-03-18 DIAGNOSIS — M25551 Pain in right hip: Secondary | ICD-10-CM | POA: Diagnosis not present

## 2023-03-18 DIAGNOSIS — M25561 Pain in right knee: Secondary | ICD-10-CM | POA: Diagnosis not present

## 2023-03-18 MED ORDER — METHYLPREDNISOLONE ACETATE 40 MG/ML IJ SUSP
40.0000 mg | INTRAMUSCULAR | Status: AC | PRN
  Administered 2023-03-18: 40 mg via INTRA_ARTICULAR

## 2023-03-18 MED ORDER — LIDOCAINE HCL 1 % IJ SOLN
5.0000 mL | INTRAMUSCULAR | Status: AC | PRN
  Administered 2023-03-18: 5 mL

## 2023-03-18 NOTE — Progress Notes (Signed)
 Office Visit Note   Patient: Monica [name]           Date of Birth: 1960-05-26           MRN: [id number] Visit Date: 03/18/2023              Requested by: Lucky Cowboy, MD [address] [place],  [place] [address] PCP: Lucky Cowboy, MD   Assessment & Plan: Visit Diagnoses:  1. Pain in right hip   2. Chronic pain of right knee     Plan: Pleasant 63 year old woman who is a patient of [facility].  Comes in today with a history of right lateral hip pain going down to her knee.  No injury.  No pain in her groin.  X-rays are reassuring.  Has trouble bringing her foot up to put on socks because it hurts so bad.  Findings consistent with trochanteric bursitis.  Talked about physical therapy and an injection.  She would like to go through with the injection today if this does not help we will consider PT  Follow-Up Instructions: Return if symptoms worsen or fail to improve.   Orders:  Orders Placed This Encounter  Procedures   XR HIP UNILAT W OR W/O PELVIS 2-3 VIEWS RIGHT   XR KNEE 3 VIEW RIGHT   No orders of the defined types were placed in this encounter.     Procedures: Large Joint Inj: R greater trochanter on 03/18/2023 9:58 AM Indications: pain and diagnostic evaluation Details: 25 G 1.5 in needle  Arthrogram: No  Medications: 5 mL lidocaine 1 %; 40 mg methylPREDNISolone acetate 40 MG/ML Outcome: tolerated well, no immediate complications Procedure, treatment alternatives, risks and benefits explained, specific risks discussed. Consent was given by the patient.       Clinical Data: No additional findings.   Subjective: Chief Complaint  Patient presents with   Right Hip - Pain   Right Knee - Pain    HPI patient is a pleasant 63 year old woman with a chief complaint of lateral right hip pain and knee pain.  She said this started about 7 weeks ago.  Pain goes down the lateral side of her leg a couple weeks ago started going throughout the  knee.  She says sitting is okay stepping bending squatting are difficult.  No known injury.  She takes Tylenol does give some relief ice and heat helps as well there is no popping  Review of Systems  All other systems reviewed and are negative.    Objective: Vital Signs: LMP 05/15/2010   Physical Exam Constitutional:      Appearance: Normal appearance.  Pulmonary:     Effort: Pulmonary effort is normal.  Musculoskeletal:     Cervical back: Normal range of motion.  Skin:    General: Skin is warm and dry.  Neurological:     General: No focal deficit present.     Mental Status: She is alert and oriented to person, place, and time.  Psychiatric:        Mood and Affect: Mood normal.        Behavior: Behavior normal.     Ortho Exam Lamination she has no radicular findings.  She has no pain in her groin or manipulation of her knee no mechanical symptoms she is extremely tender to palpation over the trochanteric bursa Specialty Comments:  No specialty comments available.  Imaging: XR KNEE 3 VIEW RIGHT Result Date: 03/18/2023 Radiographs of the right knee some early degenerative changes  with sclerotic changes and narrowing of the medial compartment  XR HIP UNILAT W OR W/O PELVIS 2-3 VIEWS RIGHT Result Date: 03/18/2023 2 views of the right hip with some cystic change in the greater trochanter no acute fractures    PMFS History: Patient Active Problem List   Diagnosis Date Noted   Scalp psoriasis 11/03/2020   History of iron deficiency anemia 11/05/2019   Anxiety 04/30/2019   History of tobacco use 11/12/2018   B12 deficiency 10/24/2017   Obesity (BMI 30.0-34.9) 05/20/2017   Insomnia 05/20/2017   History of IBS 10/18/2016   HLA B27 (HLA B27 positive) 10/04/2016   Vitamin D deficiency    Elevated blood pressure reading without diagnosis of hypertension    Hyperlipidemia    DDD (degenerative disc disease), cervical    DDD (degenerative disc disease), lumbar    Family  history of ischemic heart disease 11/28/2012   Past Medical History:  Diagnosis Date   Anal fissure    DDD (degenerative disc disease), cervical    DDD (degenerative disc disease), lumbar    Hyperlipidemia    Hypertension    Patient denies.   IBS (irritable bowel syndrome)    Lower back pain    Neck pain    Plantar fasciitis of left foot    Plantar fasciitis of right foot    Unspecified vitamin D deficiency     Family History  Problem Relation Age of Onset   Hyperlipidemia Mother    Hypertension Mother    Heart disease Father    Diabetes Father    Hyperlipidemia Father    Hypertension Father    CAD Father        bypass   Cervical cancer Sister 6   Heart disease Maternal Grandmother    CAD Maternal Grandfather    Heart attack Maternal Grandfather        x2   Stroke Maternal Aunt    Cancer Maternal Aunt        breast x 2 different aunts   Colon cancer Neg Hx    Esophageal cancer Neg Hx    Pancreatic cancer Neg Hx    Rectal cancer Neg Hx    Stomach cancer Neg Hx     Past Surgical History:  Procedure Laterality Date   CATARACT EXTRACTION, BILATERAL Bilateral 2020   Dr. Georges Mouse   CESAREAN SECTION     lower back     L4 and L5, disc surgery, no hardware, had x 2   NECK SURGERY  1998   metal plate and pins in neck, Dr. Aliene Beams   PLANTAR FASCIA SURGERY     both feet   Social History   Occupational History   Occupation: Environmental health practitioner  Tobacco Use   Smoking status: Former    Current packs/day: 0.00    Types: Cigarettes    Start date: 09/15/1992    Quit date: 09/16/2014    Years since quitting: 8.5   Smokeless tobacco: Never  Vaping Use   Vaping status: Never Used  Substance and Sexual Activity   Alcohol use: Not Currently    Alcohol/week: 0.0 standard drinks of alcohol    Comment: very rare   Drug use: No   Sexual activity: Not Currently    Partners: Male    Birth control/protection: Post-menopausal

## 2023-03-20 DIAGNOSIS — M159 Polyosteoarthritis, unspecified: Secondary | ICD-10-CM | POA: Diagnosis not present

## 2023-03-20 DIAGNOSIS — G8929 Other chronic pain: Secondary | ICD-10-CM | POA: Diagnosis not present

## 2023-03-20 DIAGNOSIS — E559 Vitamin D deficiency, unspecified: Secondary | ICD-10-CM | POA: Diagnosis not present

## 2023-03-20 DIAGNOSIS — M542 Cervicalgia: Secondary | ICD-10-CM | POA: Diagnosis not present

## 2023-03-20 DIAGNOSIS — Z0000 Encounter for general adult medical examination without abnormal findings: Secondary | ICD-10-CM | POA: Diagnosis not present

## 2023-04-17 ENCOUNTER — Ambulatory Visit: Admitting: Physician Assistant

## 2023-04-17 ENCOUNTER — Other Ambulatory Visit (INDEPENDENT_AMBULATORY_CARE_PROVIDER_SITE_OTHER): Payer: Self-pay

## 2023-04-17 ENCOUNTER — Encounter: Payer: Self-pay | Admitting: Physician Assistant

## 2023-04-17 DIAGNOSIS — M79604 Pain in right leg: Secondary | ICD-10-CM

## 2023-04-17 MED ORDER — METHOCARBAMOL 500 MG PO TABS: 500.0000 mg | ORAL_TABLET | Freq: Every day | ORAL | 1 refills | Status: DC

## 2023-04-17 NOTE — Progress Notes (Signed)
 HPI: [name] comes in today with right hip pain.  She was seen by Monica [name] on 03/18/2023 and was given a trochanteric injection.  She states that the injection helped after a few days and gave her complete relief for a week but then the pain came back.  She is having pain mainly the lateral aspect of her right hip.  Denies any back pain.  History of back surgery by Dr. Dutch [name].  She does report that she is seeing Dr. Dutch [name] recently and placed her on a prednisone Dosepak that really did not help and sent her to therapy for her IT band but therapy would not treat her due to her severe pain.  She notes that she has pain when lying on the right hip at night.  Also whenever she walks she has pain in the lateral aspect of the hip which causes her to limp.  Notes when driving she has weakness when transferring from the gas to the brake due to pain.  Denies any numbness tingling down the leg.  Does have occasional but very rare burning anterior ankle with overuse.  This began after her last office visit.  She denies any groin pain or back pain. Radiographs are personally reviewed from office visit 03/18/2023.  AP pelvis shows some very mild narrowing of the right hip joint compared to the left.  Otherwise no acute fractures acute findings.  Right knee 2 views shows mild to moderate patellofemoral arthritis and moderate narrowing medial joint line.  Otherwise no acute fractures bony abnormalities knee is well located.  Review of systems: See HPI otherwise negative  Physical exam: General Well-developed well-nourished female no acute distress.  Mood and affect appropriate. Vascular: Dorsal pedal pulses are 2+ and equal symmetric Lower extremities: 5 out of 5 strength throughout lower extremities negative straight leg raise bilaterally.  She has good range of motion of both hips however external rotation of the right hip causes pain.  Tenderness over the trochanteric region bilaterally.  Tenderness down the right IT  band to the knee.  Good range of motion of both knees without significant pain. Lumbar: Tenderness in lower right lower lumbar paraspinous region.  Full flexion extension lumbar spine without pain.  Lumbar spine 2 views: Loss of lordotic curvature.  Postsurgical changes L4-5 facet joint region.  Degenerative disc disease mildly at L1 two 23. no acute fractures or acute findings.  Impression: IT band syndrome.  Plan: Will send her to physical therapy for IT band stretching, home exercise program, modalities and range of motion.  Placed her on Robaxin 500 mg nightly.  Follow-up 6 weeks see how she is doing overall.  She is to be mindful of any radicular symptoms down the right leg.  Questions were encouraged and answered at length.

## 2023-04-17 NOTE — Addendum Note (Signed)
 Addended by: Mardene Celeste B on: 04/17/2023 01:43 PM   Modules accepted: Orders

## 2023-05-01 DIAGNOSIS — Z01419 Encounter for gynecological examination (general) (routine) without abnormal findings: Secondary | ICD-10-CM | POA: Diagnosis not present

## 2023-05-01 DIAGNOSIS — R87610 Atypical squamous cells of undetermined significance on cytologic smear of cervix (ASC-US): Secondary | ICD-10-CM | POA: Diagnosis not present

## 2023-05-01 DIAGNOSIS — Z1231 Encounter for screening mammogram for malignant neoplasm of breast: Secondary | ICD-10-CM | POA: Diagnosis not present

## 2023-05-14 DIAGNOSIS — M7631 Iliotibial band syndrome, right leg: Secondary | ICD-10-CM | POA: Diagnosis not present

## 2023-05-14 DIAGNOSIS — M6281 Muscle weakness (generalized): Secondary | ICD-10-CM | POA: Diagnosis not present

## 2023-05-14 DIAGNOSIS — M25651 Stiffness of right hip, not elsewhere classified: Secondary | ICD-10-CM | POA: Diagnosis not present

## 2023-05-14 DIAGNOSIS — R262 Difficulty in walking, not elsewhere classified: Secondary | ICD-10-CM | POA: Diagnosis not present

## 2023-05-16 ENCOUNTER — Ambulatory Visit: Admitting: Physical Therapy

## 2023-05-27 ENCOUNTER — Ambulatory Visit: Admitting: Physician Assistant

## 2023-05-27 ENCOUNTER — Encounter: Payer: Self-pay | Admitting: Physician Assistant

## 2023-05-27 DIAGNOSIS — M7631 Iliotibial band syndrome, right leg: Secondary | ICD-10-CM

## 2023-05-27 NOTE — Progress Notes (Signed)
 HPI: [name]: Returns today for right hip pain.  She states she is about 50% better with the exercises, therapy medications.  She has had no radicular symptoms down the leg.  Pain remains mainly at the lateral aspect of the right hip worse when walking for prolonged period of time, doing abduction exercises and lying on the hip.  She denies any groin pain.  Review of systems: See HPI  Physical exam: General: Well-developed well-nourished female in no acute distress ambulates without any distant device.  Able to get on and off the exam table on her own. Bilateral hips: Good range of motion both hips.  Left hip no pain with internal/external rotation.  Right hip slight discomfort with internal rotation.  Tenderness over the right IT band greatest tenderness over the trochanteric region.  Impression: Right hip IT band syndrome  Plan: Will have her continue to work on stretching.  Avoid abduction exercises but continue other exercises as shown by therapy.  Therapy can include modalities.  She will follow-up with us  if pain persist or becomes worse.  Questions were encouraged and answered at length.

## 2023-05-28 DIAGNOSIS — M542 Cervicalgia: Secondary | ICD-10-CM | POA: Diagnosis not present

## 2023-05-28 DIAGNOSIS — F1120 Opioid dependence, uncomplicated: Secondary | ICD-10-CM | POA: Diagnosis not present

## 2023-05-28 DIAGNOSIS — M7631 Iliotibial band syndrome, right leg: Secondary | ICD-10-CM | POA: Diagnosis not present

## 2023-07-08 ENCOUNTER — Encounter: Payer: Self-pay | Admitting: Physician Assistant

## 2023-07-08 ENCOUNTER — Ambulatory Visit (INDEPENDENT_AMBULATORY_CARE_PROVIDER_SITE_OTHER): Admitting: Physician Assistant

## 2023-07-08 DIAGNOSIS — M7631 Iliotibial band syndrome, right leg: Secondary | ICD-10-CM | POA: Diagnosis not present

## 2023-07-08 MED ORDER — METHYLPREDNISOLONE ACETATE 40 MG/ML IJ SUSP
40.0000 mg | INTRAMUSCULAR | Status: AC | PRN
  Administered 2023-07-08: 40 mg via INTRA_ARTICULAR

## 2023-07-08 MED ORDER — METHYLPREDNISOLONE ACETATE 40 MG/ML IJ SUSP
40.0000 mg | INTRAMUSCULAR | Status: AC | PRN
  Administered 2023-07-08: 40 mg via INTRAMUSCULAR

## 2023-07-08 MED ORDER — LIDOCAINE HCL 1 % IJ SOLN
1.0000 mL | INTRAMUSCULAR | Status: AC | PRN
  Administered 2023-07-08: 1 mL

## 2023-07-08 MED ORDER — LIDOCAINE HCL 1 % IJ SOLN
3.0000 mL | INTRAMUSCULAR | Status: AC | PRN
  Administered 2023-07-08: 3 mL

## 2023-07-08 NOTE — Progress Notes (Addendum)
   Procedure Note  Patient: Monica [name]             Date of Birth: 1960/01/28           MRN: [id number]             Visit Date: 07/08/2023 HPI: Mrs. Moore: Returns today due to right hip pain.  She has been going to physical therapy.  States that the cortisone injection right hip for trochanteric bursitis was helpful.  She is now having pain down to the knee.  Occasional groin pain but her main complaint is when lying on the right hip.  No numbness tingling down the leg.  No new injuries.  Radiographs previously showed some minimal arthritic changes of the right hip.  Review of systems: Denies any change in her overall medical status.  Denies any fevers chills.  Physical exam: Bilateral hips good range of motion.  Tenderness over the right hip trochanteric region.  Tenderness right distal IT band at the insertion.  No rashes skin lesions ulcerations erythema edema or effusion right knee.  Overall good range of motion right knee without pain.   Procedures: Visit Diagnoses:  1. It band syndrome, right     Large Joint Inj: R greater trochanter on 07/08/2023 11:53 AM Indications: pain Details: 22 G 1.5 in needle, lateral approach  Arthrogram: No  Medications: 3 mL lidocaine  1 %; 40 mg methylPREDNISolone  acetate 40 MG/ML Outcome: tolerated well, no immediate complications Procedure, treatment alternatives, risks and benefits explained, specific risks discussed. Consent was given by the patient. Immediately prior to procedure a time out was called to verify the correct patient, procedure, equipment, support staff and site/side marked as required. Patient was prepped and draped in the usual sterile fashion.    Trigger Point Inj  Date/Time: 07/08/2023 11:54 AM  Performed by: Gretta Bertrum ORN, PA-C Authorized by: Gretta Bertrum ORN, PA-C   Consent Given by:  Patient Indications:  Pain and therapeutic Total # of Trigger Points:  1 Location: lower extremity   Approach:   Lateral Medications #1:  1 mL lidocaine  1 %; 40 mg methylPREDNISolone  acetate 40 MG/ML  Plan: Will have her continue therapy for IT band stretching.  Will recommend they include modalities particularly dry needling.  She will follow-up with us  if pain persist or becomes worse.  She may need an MRI of her right hip in the future if pain persist despite these conservative measures.  Questions were encouraged and answered at length

## 2023-07-08 NOTE — Addendum Note (Signed)
 Addended by: GRETTA FORTE on: 07/08/2023 11:56 AM   Modules accepted: Orders

## 2023-07-21 ENCOUNTER — Other Ambulatory Visit: Payer: Self-pay | Admitting: Physician Assistant

## 2023-08-28 DIAGNOSIS — F1120 Opioid dependence, uncomplicated: Secondary | ICD-10-CM | POA: Diagnosis not present

## 2023-08-28 DIAGNOSIS — M7631 Iliotibial band syndrome, right leg: Secondary | ICD-10-CM | POA: Diagnosis not present

## 2023-08-28 DIAGNOSIS — M542 Cervicalgia: Secondary | ICD-10-CM | POA: Diagnosis not present

## 2023-08-28 DIAGNOSIS — M25551 Pain in right hip: Secondary | ICD-10-CM | POA: Diagnosis not present

## 2023-09-03 DIAGNOSIS — Z6834 Body mass index (BMI) 34.0-34.9, adult: Secondary | ICD-10-CM | POA: Diagnosis not present

## 2023-09-03 DIAGNOSIS — E559 Vitamin D deficiency, unspecified: Secondary | ICD-10-CM | POA: Diagnosis not present

## 2023-09-03 DIAGNOSIS — Z713 Dietary counseling and surveillance: Secondary | ICD-10-CM | POA: Diagnosis not present

## 2023-09-03 DIAGNOSIS — E782 Mixed hyperlipidemia: Secondary | ICD-10-CM | POA: Diagnosis not present

## 2023-09-18 DIAGNOSIS — M25551 Pain in right hip: Secondary | ICD-10-CM | POA: Diagnosis not present

## 2023-11-18 ENCOUNTER — Encounter: Payer: Self-pay | Admitting: Radiology

## 2023-12-19 DIAGNOSIS — M542 Cervicalgia: Secondary | ICD-10-CM | POA: Diagnosis not present

## 2023-12-19 DIAGNOSIS — F1120 Opioid dependence, uncomplicated: Secondary | ICD-10-CM | POA: Diagnosis not present

## 2023-12-19 DIAGNOSIS — M25551 Pain in right hip: Secondary | ICD-10-CM | POA: Diagnosis not present

## 2023-12-31 DIAGNOSIS — M25551 Pain in right hip: Secondary | ICD-10-CM | POA: Diagnosis not present

## 2024-01-15 ENCOUNTER — Ambulatory Visit: Admitting: Orthopaedic Surgery

## 2024-01-15 ENCOUNTER — Encounter: Payer: Self-pay | Admitting: Orthopaedic Surgery

## 2024-01-15 ENCOUNTER — Other Ambulatory Visit: Payer: Self-pay | Admitting: Radiology

## 2024-01-15 DIAGNOSIS — M25551 Pain in right hip: Secondary | ICD-10-CM

## 2024-01-15 NOTE — Progress Notes (Signed)
 The patient is well-known to us .  She is an active 63 year old female who has been dealing with right hip pain for well over a year now.  She has now had at least 3 steroid injections around her right hip trochanteric area.  She has also been through physical therapy as an outpatient.  She has tried topical anti-inflammatories as well as activity modification and oral anti-inflammatories.  Her pain is definitely getting worse and now it is in the groin and around that hip.  X-rays previously of the right hip and pelvis earlier this year show well-maintained joint space.  On my exam today her pain is significantly worse around her right hip trochanteric area but also in the groin.  She has stiffness with rotation and this does seem to be getting worse.  At this point given the failure of conservative treatment for well over a year now including anti-inflammatories, multiple steroid injections and outpatient physical therapy, a MRI is warranted of the right hip to assess the cartilage and the tendon structures around the right hip given the severity of her pain and her worsening clinical picture and exam findings.  We believe this is medically necessary at this standpoint.  She knows to contact us  for follow-up once the MRI has been established of the right hip.  She agrees to this treatment plan.  All questions and concerns were addressed and answered.

## 2024-01-21 ENCOUNTER — Encounter: Payer: Self-pay | Admitting: Orthopaedic Surgery

## 2024-01-24 ENCOUNTER — Ambulatory Visit
Admission: RE | Admit: 2024-01-24 | Discharge: 2024-01-24 | Disposition: A | Source: Ambulatory Visit | Attending: Orthopaedic Surgery | Admitting: Orthopaedic Surgery

## 2024-01-24 DIAGNOSIS — M25551 Pain in right hip: Secondary | ICD-10-CM

## 2024-02-05 ENCOUNTER — Ambulatory Visit: Admitting: Orthopaedic Surgery

## 2024-02-05 DIAGNOSIS — M25551 Pain in right hip: Secondary | ICD-10-CM

## 2024-02-05 DIAGNOSIS — M1611 Unilateral primary osteoarthritis, right hip: Secondary | ICD-10-CM

## 2024-02-05 NOTE — Progress Notes (Signed)
 The patient is a 64 year old female who comes in to go over MRI of her right hip.  She has been dealing with right hip pain for a year now.  It was originally just only over the trochanteric area around her IT band but started develop some stiffness and pain with weightbearing and pain in the groin.  We sent her for an MRI and she is here for follow-up of that.  She does walk with a limp today and she does report that she is having a harder time with daily hip pain that does wake her up at night and is harder to put her shoes and socks on the right side.  Her left hip moves smoothly and fluidly.  Her right hip has significant pain and stiffness with rotation.  MRI of her right hip shows significant subchondral edema in the femoral head and acetabulum.  There is a chondral lesion in the femoral neck which appears benign.  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  I did go over the MRI findings with her.  From a surgical standpoint and treatment standpoint, we are recommending a total hip arthroplasty.  At this point injections would not be worthwhile.  She can offload her hip with a cane or opposite hand.  She can try anti-inflammatories and activity modification and rest.  I did discuss hip replacement surgery and am happy to discuss this at any time with her further in the near future.  She states she would like to think about this.  I did give her handout about hip replacement surgery as well and gave her our surgery scheduler's card.  She is welcome to reach out to us  at any time.  All question concerns were addressed and answered.

## 2024-03-26 ENCOUNTER — Encounter: Admitting: Orthopaedic Surgery
